# Patient Record
Sex: Female | Born: 1945 | Race: White | Hispanic: No | Marital: Single | Smoking: Former smoker
Health system: Southern US, Community
[De-identification: ages and names within clinical notes are randomized; demographics above are authoritative.]

## PROBLEM LIST (undated history)

## (undated) DIAGNOSIS — IMO0002 Reserved for concepts with insufficient information to code with codable children: Secondary | ICD-10-CM

## (undated) DIAGNOSIS — Z9981 Dependence on supplemental oxygen: Secondary | ICD-10-CM

## (undated) DIAGNOSIS — I739 Peripheral vascular disease, unspecified: Secondary | ICD-10-CM

## (undated) DIAGNOSIS — E785 Hyperlipidemia, unspecified: Secondary | ICD-10-CM

## (undated) DIAGNOSIS — I4891 Unspecified atrial fibrillation: Secondary | ICD-10-CM

## (undated) DIAGNOSIS — R011 Cardiac murmur, unspecified: Secondary | ICD-10-CM

## (undated) DIAGNOSIS — I10 Essential (primary) hypertension: Secondary | ICD-10-CM

## (undated) DIAGNOSIS — G4733 Obstructive sleep apnea (adult) (pediatric): Secondary | ICD-10-CM

## (undated) DIAGNOSIS — K449 Diaphragmatic hernia without obstruction or gangrene: Secondary | ICD-10-CM

## (undated) DIAGNOSIS — K2970 Gastritis, unspecified, without bleeding: Secondary | ICD-10-CM

## (undated) DIAGNOSIS — R739 Hyperglycemia, unspecified: Secondary | ICD-10-CM

## (undated) DIAGNOSIS — Z972 Presence of dental prosthetic device (complete) (partial): Secondary | ICD-10-CM

## (undated) DIAGNOSIS — R590 Localized enlarged lymph nodes: Secondary | ICD-10-CM

## (undated) DIAGNOSIS — I2510 Atherosclerotic heart disease of native coronary artery without angina pectoris: Secondary | ICD-10-CM

## (undated) DIAGNOSIS — M8580 Other specified disorders of bone density and structure, unspecified site: Secondary | ICD-10-CM

## (undated) DIAGNOSIS — C801 Malignant (primary) neoplasm, unspecified: Secondary | ICD-10-CM

## (undated) DIAGNOSIS — E669 Obesity, unspecified: Secondary | ICD-10-CM

## (undated) DIAGNOSIS — K922 Gastrointestinal hemorrhage, unspecified: Secondary | ICD-10-CM

## (undated) DIAGNOSIS — I639 Cerebral infarction, unspecified: Secondary | ICD-10-CM

## (undated) DIAGNOSIS — N189 Chronic kidney disease, unspecified: Secondary | ICD-10-CM

## (undated) DIAGNOSIS — J189 Pneumonia, unspecified organism: Secondary | ICD-10-CM

## (undated) HISTORY — PX: OTHER SURGICAL HISTORY: SHX169

## (undated) HISTORY — DX: Cardiac murmur, unspecified: R01.1

## (undated) HISTORY — DX: Gastritis, unspecified, without bleeding: K29.70

## (undated) HISTORY — DX: Peripheral vascular disease, unspecified: I73.9

## (undated) HISTORY — PX: BREAST SURGERY: SHX581

## (undated) HISTORY — DX: Chronic kidney disease, unspecified: N18.9

## (undated) HISTORY — DX: Hyperglycemia, unspecified: R73.9

## (undated) HISTORY — DX: Obstructive sleep apnea (adult) (pediatric): G47.33

## (undated) HISTORY — DX: Hyperlipidemia, unspecified: E78.5

## (undated) HISTORY — PX: CATARACT EXTRACTION W/ INTRAOCULAR LENS  IMPLANT, BILATERAL: SHX1307

## (undated) HISTORY — DX: Atherosclerotic heart disease of native coronary artery without angina pectoris: I25.10

## (undated) HISTORY — PX: CHOLECYSTECTOMY: SHX55

## (undated) HISTORY — DX: Obesity, unspecified: E66.9

## (undated) HISTORY — PX: COLONOSCOPY W/ BIOPSIES AND POLYPECTOMY: SHX1376

## (undated) HISTORY — DX: Gastrointestinal hemorrhage, unspecified: K92.2

## (undated) HISTORY — DX: Unspecified atrial fibrillation: I48.91

## (undated) HISTORY — DX: Cerebral infarction, unspecified: I63.9

## (undated) HISTORY — PX: ANGIOPLASTY / STENTING FEMORAL: SUR30

## (undated) HISTORY — DX: Essential (primary) hypertension: I10

## (undated) HISTORY — DX: Other specified disorders of bone density and structure, unspecified site: M85.80

## (undated) HISTORY — DX: Reserved for concepts with insufficient information to code with codable children: IMO0002

## (undated) HISTORY — DX: Diaphragmatic hernia without obstruction or gangrene: K44.9

---

## 2002-10-26 ENCOUNTER — Emergency Department (HOSPITAL_COMMUNITY): Admission: EM | Admit: 2002-10-26 | Discharge: 2002-10-26 | Payer: Self-pay | Admitting: Emergency Medicine

## 2002-12-31 ENCOUNTER — Emergency Department (HOSPITAL_COMMUNITY): Admission: EM | Admit: 2002-12-31 | Discharge: 2002-12-31 | Payer: Self-pay | Admitting: *Deleted

## 2004-05-01 ENCOUNTER — Encounter: Admission: RE | Admit: 2004-05-01 | Discharge: 2004-05-01 | Payer: Self-pay | Admitting: Nurse Practitioner

## 2005-01-28 ENCOUNTER — Encounter: Admission: RE | Admit: 2005-01-28 | Discharge: 2005-01-28 | Payer: Self-pay | Admitting: Family Medicine

## 2005-01-28 ENCOUNTER — Inpatient Hospital Stay (HOSPITAL_COMMUNITY): Admission: AD | Admit: 2005-01-28 | Discharge: 2005-01-30 | Payer: Self-pay | Admitting: General Surgery

## 2005-01-28 ENCOUNTER — Encounter (INDEPENDENT_AMBULATORY_CARE_PROVIDER_SITE_OTHER): Payer: Self-pay | Admitting: *Deleted

## 2005-06-18 ENCOUNTER — Encounter: Admission: RE | Admit: 2005-06-18 | Discharge: 2005-06-18 | Payer: Self-pay | Admitting: Family Medicine

## 2006-07-04 LAB — HM MAMMOGRAPHY

## 2006-07-14 ENCOUNTER — Encounter: Admission: RE | Admit: 2006-07-14 | Discharge: 2006-07-14 | Payer: Self-pay | Admitting: General Surgery

## 2007-01-20 ENCOUNTER — Encounter: Admission: RE | Admit: 2007-01-20 | Discharge: 2007-01-20 | Payer: Self-pay | Admitting: Cardiovascular Disease

## 2007-01-25 ENCOUNTER — Observation Stay (HOSPITAL_COMMUNITY): Admission: RE | Admit: 2007-01-25 | Discharge: 2007-01-26 | Payer: Self-pay | Admitting: Cardiovascular Disease

## 2007-03-05 LAB — HM COLONOSCOPY

## 2007-07-19 ENCOUNTER — Encounter: Admission: RE | Admit: 2007-07-19 | Discharge: 2007-07-19 | Payer: Self-pay | Admitting: Family Medicine

## 2007-09-09 ENCOUNTER — Encounter: Admission: RE | Admit: 2007-09-09 | Discharge: 2007-09-09 | Payer: Self-pay | Admitting: Cardiovascular Disease

## 2007-09-15 ENCOUNTER — Inpatient Hospital Stay (HOSPITAL_COMMUNITY): Admission: AD | Admit: 2007-09-15 | Discharge: 2007-09-16 | Payer: Self-pay | Admitting: Cardiovascular Disease

## 2007-10-12 ENCOUNTER — Inpatient Hospital Stay (HOSPITAL_COMMUNITY): Admission: AD | Admit: 2007-10-12 | Discharge: 2007-10-13 | Payer: Self-pay | Admitting: Cardiovascular Disease

## 2008-06-20 ENCOUNTER — Encounter: Admission: RE | Admit: 2008-06-20 | Discharge: 2008-06-20 | Payer: Self-pay | Admitting: Cardiovascular Disease

## 2008-06-26 ENCOUNTER — Inpatient Hospital Stay (HOSPITAL_COMMUNITY): Admission: RE | Admit: 2008-06-26 | Discharge: 2008-06-27 | Payer: Self-pay | Admitting: Cardiovascular Disease

## 2008-07-19 ENCOUNTER — Encounter: Admission: RE | Admit: 2008-07-19 | Discharge: 2008-07-19 | Payer: Self-pay | Admitting: Family Medicine

## 2009-05-15 ENCOUNTER — Encounter: Admission: RE | Admit: 2009-05-15 | Discharge: 2009-05-15 | Payer: Self-pay | Admitting: Cardiovascular Disease

## 2009-05-18 ENCOUNTER — Inpatient Hospital Stay (HOSPITAL_COMMUNITY): Admission: RE | Admit: 2009-05-18 | Discharge: 2009-05-19 | Payer: Self-pay | Admitting: Cardiovascular Disease

## 2009-06-18 ENCOUNTER — Ambulatory Visit: Payer: Self-pay | Admitting: Surgery

## 2009-07-03 ENCOUNTER — Encounter: Admission: RE | Admit: 2009-07-03 | Discharge: 2009-07-03 | Payer: Self-pay | Admitting: Surgery

## 2009-07-23 ENCOUNTER — Ambulatory Visit: Payer: Self-pay | Admitting: Surgery

## 2010-01-18 ENCOUNTER — Inpatient Hospital Stay (HOSPITAL_COMMUNITY): Admission: EM | Admit: 2010-01-18 | Discharge: 2010-01-24 | Payer: Self-pay | Admitting: Emergency Medicine

## 2010-01-22 ENCOUNTER — Encounter (INDEPENDENT_AMBULATORY_CARE_PROVIDER_SITE_OTHER): Payer: Self-pay | Admitting: Neurology

## 2010-01-23 ENCOUNTER — Ambulatory Visit: Payer: Self-pay | Admitting: Vascular Surgery

## 2010-02-14 ENCOUNTER — Encounter: Admission: RE | Admit: 2010-02-14 | Discharge: 2010-02-14 | Payer: Self-pay | Admitting: Family Medicine

## 2010-03-07 ENCOUNTER — Ambulatory Visit (HOSPITAL_COMMUNITY): Admission: RE | Admit: 2010-03-07 | Discharge: 2010-03-07 | Payer: Self-pay | Admitting: Family Medicine

## 2010-03-19 ENCOUNTER — Inpatient Hospital Stay (HOSPITAL_COMMUNITY): Admission: EM | Admit: 2010-03-19 | Discharge: 2010-03-24 | Payer: Self-pay | Admitting: Emergency Medicine

## 2010-05-02 ENCOUNTER — Ambulatory Visit (HOSPITAL_COMMUNITY): Admission: RE | Admit: 2010-05-02 | Discharge: 2010-05-02 | Payer: Self-pay | Admitting: Ophthalmology

## 2010-05-16 ENCOUNTER — Inpatient Hospital Stay (HOSPITAL_COMMUNITY): Admission: EM | Admit: 2010-05-16 | Discharge: 2010-05-30 | Payer: Self-pay | Admitting: Emergency Medicine

## 2010-05-17 ENCOUNTER — Ambulatory Visit: Payer: Self-pay | Admitting: Cardiothoracic Surgery

## 2010-05-17 ENCOUNTER — Encounter: Payer: Self-pay | Admitting: Cardiothoracic Surgery

## 2010-05-19 ENCOUNTER — Encounter: Payer: Self-pay | Admitting: Cardiothoracic Surgery

## 2010-05-22 ENCOUNTER — Encounter: Payer: Self-pay | Admitting: Cardiothoracic Surgery

## 2010-05-22 HISTORY — PX: CORONARY ARTERY BYPASS GRAFT: SHX141

## 2010-06-20 ENCOUNTER — Ambulatory Visit: Payer: Self-pay | Admitting: Cardiothoracic Surgery

## 2010-06-20 ENCOUNTER — Encounter: Admission: RE | Admit: 2010-06-20 | Discharge: 2010-06-20 | Payer: Self-pay | Admitting: Cardiothoracic Surgery

## 2010-08-13 HISTORY — PX: OTHER SURGICAL HISTORY: SHX169

## 2010-08-25 ENCOUNTER — Encounter: Payer: Self-pay | Admitting: Family Medicine

## 2010-09-11 ENCOUNTER — Encounter (HOSPITAL_COMMUNITY): Payer: Medicare Other

## 2010-09-11 ENCOUNTER — Other Ambulatory Visit: Payer: Self-pay | Admitting: Ophthalmology

## 2010-09-11 DIAGNOSIS — Z01812 Encounter for preprocedural laboratory examination: Secondary | ICD-10-CM | POA: Insufficient documentation

## 2010-09-11 LAB — BASIC METABOLIC PANEL
CO2: 31 mEq/L (ref 19–32)
Calcium: 9.2 mg/dL (ref 8.4–10.5)
Creatinine, Ser: 1.65 mg/dL — ABNORMAL HIGH (ref 0.4–1.2)
GFR calc Af Amer: 38 mL/min — ABNORMAL LOW (ref >60)
Glucose, Bld: 114 mg/dL — ABNORMAL HIGH (ref 70–99)
Potassium: 4.9 mEq/L (ref 3.5–5.1)

## 2010-09-16 ENCOUNTER — Ambulatory Visit (HOSPITAL_COMMUNITY)
Admission: RE | Admit: 2010-09-16 | Discharge: 2010-09-16 | Disposition: A | Discharge status: Home / Self Care | Payer: Medicare Other | Source: Ambulatory Visit | Attending: Ophthalmology | Admitting: Ophthalmology

## 2010-09-16 DIAGNOSIS — Z794 Long term (current) use of insulin: Secondary | ICD-10-CM | POA: Insufficient documentation

## 2010-09-16 DIAGNOSIS — Z01812 Encounter for preprocedural laboratory examination: Secondary | ICD-10-CM | POA: Insufficient documentation

## 2010-09-16 DIAGNOSIS — I10 Essential (primary) hypertension: Secondary | ICD-10-CM | POA: Insufficient documentation

## 2010-09-16 DIAGNOSIS — E119 Type 2 diabetes mellitus without complications: Secondary | ICD-10-CM | POA: Insufficient documentation

## 2010-09-16 DIAGNOSIS — H2510 Age-related nuclear cataract, unspecified eye: Secondary | ICD-10-CM | POA: Insufficient documentation

## 2010-09-16 DIAGNOSIS — Z79899 Other long term (current) drug therapy: Secondary | ICD-10-CM | POA: Insufficient documentation

## 2010-09-16 DIAGNOSIS — Z951 Presence of aortocoronary bypass graft: Secondary | ICD-10-CM | POA: Insufficient documentation

## 2010-09-16 DIAGNOSIS — Z01818 Encounter for other preprocedural examination: Secondary | ICD-10-CM | POA: Insufficient documentation

## 2010-09-16 LAB — GLUCOSE, CAPILLARY: Glucose-Capillary: 107 mg/dL — ABNORMAL HIGH (ref 70–99)

## 2010-10-16 LAB — CBC
HCT: 23.7 % — ABNORMAL LOW (ref 36.0–46.0)
HCT: 25.3 % — ABNORMAL LOW (ref 36.0–46.0)
HCT: 25.3 % — ABNORMAL LOW (ref 36.0–46.0)
HCT: 25.6 % — ABNORMAL LOW (ref 36.0–46.0)
HCT: 25.8 % — ABNORMAL LOW (ref 36.0–46.0)
HCT: 26.2 % — ABNORMAL LOW (ref 36.0–46.0)
HCT: 26.9 % — ABNORMAL LOW (ref 36.0–46.0)
HCT: 27.9 % — ABNORMAL LOW (ref 36.0–46.0)
HCT: 28.4 % — ABNORMAL LOW (ref 36.0–46.0)
HCT: 29.2 % — ABNORMAL LOW (ref 36.0–46.0)
HCT: 29.5 % — ABNORMAL LOW (ref 36.0–46.0)
HCT: 29.9 % — ABNORMAL LOW (ref 36.0–46.0)
HCT: 30.8 % — ABNORMAL LOW (ref 36.0–46.0)
HCT: 31.3 % — ABNORMAL LOW (ref 36.0–46.0)
Hemoglobin: 10 g/dL — ABNORMAL LOW (ref 12.0–15.0)
Hemoglobin: 10.1 g/dL — ABNORMAL LOW (ref 12.0–15.0)
Hemoglobin: 10.2 g/dL — ABNORMAL LOW (ref 12.0–15.0)
Hemoglobin: 10.5 g/dL — ABNORMAL LOW (ref 12.0–15.0)
Hemoglobin: 7.9 g/dL — ABNORMAL LOW (ref 12.0–15.0)
Hemoglobin: 8.3 g/dL — ABNORMAL LOW (ref 12.0–15.0)
Hemoglobin: 8.3 g/dL — ABNORMAL LOW (ref 12.0–15.0)
Hemoglobin: 8.4 g/dL — ABNORMAL LOW (ref 12.0–15.0)
Hemoglobin: 8.6 g/dL — ABNORMAL LOW (ref 12.0–15.0)
Hemoglobin: 8.8 g/dL — ABNORMAL LOW (ref 12.0–15.0)
Hemoglobin: 8.8 g/dL — ABNORMAL LOW (ref 12.0–15.0)
Hemoglobin: 9.3 g/dL — ABNORMAL LOW (ref 12.0–15.0)
Hemoglobin: 9.6 g/dL — ABNORMAL LOW (ref 12.0–15.0)
MCH: 31.5 pg (ref 26.0–34.0)
MCH: 31.5 pg (ref 26.0–34.0)
MCH: 31.7 pg (ref 26.0–34.0)
MCH: 31.8 pg (ref 26.0–34.0)
MCH: 31.8 pg (ref 26.0–34.0)
MCH: 31.9 pg (ref 26.0–34.0)
MCH: 32 pg (ref 26.0–34.0)
MCH: 32.1 pg (ref 26.0–34.0)
MCH: 32.2 pg (ref 26.0–34.0)
MCH: 32.2 pg (ref 26.0–34.0)
MCH: 32.3 pg (ref 26.0–34.0)
MCH: 33.2 pg (ref 26.0–34.0)
MCH: 33.2 pg (ref 26.0–34.0)
MCH: 33.2 pg (ref 26.0–34.0)
MCHC: 32.4 g/dL (ref 30.0–36.0)
MCHC: 32.7 g/dL (ref 30.0–36.0)
MCHC: 32.8 g/dL (ref 30.0–36.0)
MCHC: 32.8 g/dL (ref 30.0–36.0)
MCHC: 32.8 g/dL (ref 30.0–36.0)
MCHC: 33.1 g/dL (ref 30.0–36.0)
MCHC: 33.2 g/dL (ref 30.0–36.0)
MCHC: 33.3 g/dL (ref 30.0–36.0)
MCHC: 33.3 g/dL (ref 30.0–36.0)
MCHC: 33.3 g/dL (ref 30.0–36.0)
MCHC: 33.4 g/dL (ref 30.0–36.0)
MCHC: 33.5 g/dL (ref 30.0–36.0)
MCHC: 33.6 g/dL (ref 30.0–36.0)
MCHC: 33.6 g/dL (ref 30.0–36.0)
MCHC: 33.8 g/dL (ref 30.0–36.0)
MCHC: 34.1 g/dL (ref 30.0–36.0)
MCHC: 34.6 g/dL (ref 30.0–36.0)
MCV: 92.5 fL (ref 78.0–100.0)
MCV: 93.4 fL (ref 78.0–100.0)
MCV: 94.8 fL (ref 78.0–100.0)
MCV: 95 fL (ref 78.0–100.0)
MCV: 96.3 fL (ref 78.0–100.0)
MCV: 96.5 fL (ref 78.0–100.0)
MCV: 96.7 fL (ref 78.0–100.0)
MCV: 96.9 fL (ref 78.0–100.0)
MCV: 96.9 fL (ref 78.0–100.0)
MCV: 97.3 fL (ref 78.0–100.0)
MCV: 97.8 fL (ref 78.0–100.0)
MCV: 98.5 fL (ref 78.0–100.0)
MCV: 99.1 fL (ref 78.0–100.0)
Platelets: 102 10*3/uL — ABNORMAL LOW (ref 150–400)
Platelets: 106 10*3/uL — ABNORMAL LOW (ref 150–400)
Platelets: 112 10*3/uL — ABNORMAL LOW (ref 150–400)
Platelets: 117 10*3/uL — ABNORMAL LOW (ref 150–400)
Platelets: 133 10*3/uL — ABNORMAL LOW (ref 150–400)
Platelets: 136 10*3/uL — ABNORMAL LOW (ref 150–400)
Platelets: 140 10*3/uL — ABNORMAL LOW (ref 150–400)
Platelets: 153 10*3/uL (ref 150–400)
Platelets: 168 10*3/uL (ref 150–400)
Platelets: 168 10*3/uL (ref 150–400)
Platelets: 86 10*3/uL — ABNORMAL LOW (ref 150–400)
Platelets: 93 10*3/uL — ABNORMAL LOW (ref 150–400)
Platelets: 95 10*3/uL — ABNORMAL LOW (ref 150–400)
RBC: 2.46 MIL/uL — ABNORMAL LOW (ref 3.87–5.11)
RBC: 2.6 MIL/uL — ABNORMAL LOW (ref 3.87–5.11)
RBC: 2.61 MIL/uL — ABNORMAL LOW (ref 3.87–5.11)
RBC: 2.67 MIL/uL — ABNORMAL LOW (ref 3.87–5.11)
RBC: 2.71 MIL/uL — ABNORMAL LOW (ref 3.87–5.11)
RBC: 2.75 MIL/uL — ABNORMAL LOW (ref 3.87–5.11)
RBC: 2.79 MIL/uL — ABNORMAL LOW (ref 3.87–5.11)
RBC: 2.89 MIL/uL — ABNORMAL LOW (ref 3.87–5.11)
RBC: 3.16 MIL/uL — ABNORMAL LOW (ref 3.87–5.11)
RBC: 3.16 MIL/uL — ABNORMAL LOW (ref 3.87–5.11)
RBC: 3.18 MIL/uL — ABNORMAL LOW (ref 3.87–5.11)
RDW: 12.9 % (ref 11.5–15.5)
RDW: 13 % (ref 11.5–15.5)
RDW: 13 % (ref 11.5–15.5)
RDW: 13.1 % (ref 11.5–15.5)
RDW: 13.2 % (ref 11.5–15.5)
RDW: 15.7 % — ABNORMAL HIGH (ref 11.5–15.5)
RDW: 15.8 % — ABNORMAL HIGH (ref 11.5–15.5)
RDW: 15.9 % — ABNORMAL HIGH (ref 11.5–15.5)
RDW: 16 % — ABNORMAL HIGH (ref 11.5–15.5)
RDW: 16 % — ABNORMAL HIGH (ref 11.5–15.5)
RDW: 16 % — ABNORMAL HIGH (ref 11.5–15.5)
RDW: 16.3 % — ABNORMAL HIGH (ref 11.5–15.5)
RDW: 16.5 % — ABNORMAL HIGH (ref 11.5–15.5)
RDW: 16.9 % — ABNORMAL HIGH (ref 11.5–15.5)
RDW: 16.9 % — ABNORMAL HIGH (ref 11.5–15.5)
WBC: 10 10*3/uL (ref 4.0–10.5)
WBC: 11.2 10*3/uL — ABNORMAL HIGH (ref 4.0–10.5)
WBC: 12.6 10*3/uL — ABNORMAL HIGH (ref 4.0–10.5)
WBC: 4.9 10*3/uL (ref 4.0–10.5)
WBC: 4.9 10*3/uL (ref 4.0–10.5)
WBC: 5.3 10*3/uL (ref 4.0–10.5)
WBC: 5.8 10*3/uL (ref 4.0–10.5)
WBC: 6.5 10*3/uL (ref 4.0–10.5)
WBC: 6.6 10*3/uL (ref 4.0–10.5)
WBC: 7 10*3/uL (ref 4.0–10.5)
WBC: 8.8 10*3/uL (ref 4.0–10.5)
WBC: 9.4 10*3/uL (ref 4.0–10.5)

## 2010-10-16 LAB — BLOOD GAS, ARTERIAL
Acid-Base Excess: 4.9 mmol/L — ABNORMAL HIGH (ref 0.0–2.0)
Bicarbonate: 28.2 mEq/L — ABNORMAL HIGH (ref 20.0–24.0)
Drawn by: 13842
FIO2: 0.21 %
O2 Saturation: 98.6 %
Patient temperature: 98.6
TCO2: 29.4 mmol/L (ref 0–100)
pCO2 arterial: 36.8 mmHg (ref 35.0–45.0)
pH, Arterial: 7.497 — ABNORMAL HIGH (ref 7.350–7.400)
pO2, Arterial: 103 mmHg — ABNORMAL HIGH (ref 80.0–100.0)

## 2010-10-16 LAB — BASIC METABOLIC PANEL
BUN: 11 mg/dL (ref 6–23)
BUN: 11 mg/dL (ref 6–23)
BUN: 11 mg/dL (ref 6–23)
BUN: 12 mg/dL (ref 6–23)
BUN: 12 mg/dL (ref 6–23)
BUN: 15 mg/dL (ref 6–23)
BUN: 15 mg/dL (ref 6–23)
BUN: 16 mg/dL (ref 6–23)
BUN: 18 mg/dL (ref 6–23)
BUN: 19 mg/dL (ref 6–23)
BUN: 20 mg/dL (ref 6–23)
BUN: 22 mg/dL (ref 6–23)
CO2: 25 mEq/L (ref 19–32)
CO2: 28 mEq/L (ref 19–32)
CO2: 29 mEq/L (ref 19–32)
CO2: 30 mEq/L (ref 19–32)
CO2: 30 mEq/L (ref 19–32)
CO2: 31 mEq/L (ref 19–32)
CO2: 31 mEq/L (ref 19–32)
CO2: 32 mEq/L (ref 19–32)
CO2: 32 mEq/L (ref 19–32)
CO2: 33 mEq/L — ABNORMAL HIGH (ref 19–32)
CO2: 35 mEq/L — ABNORMAL HIGH (ref 19–32)
CO2: 35 mEq/L — ABNORMAL HIGH (ref 19–32)
Calcium: 8 mg/dL — ABNORMAL LOW (ref 8.4–10.5)
Calcium: 8 mg/dL — ABNORMAL LOW (ref 8.4–10.5)
Calcium: 8.4 mg/dL (ref 8.4–10.5)
Calcium: 8.4 mg/dL (ref 8.4–10.5)
Calcium: 8.4 mg/dL (ref 8.4–10.5)
Calcium: 8.6 mg/dL (ref 8.4–10.5)
Calcium: 8.8 mg/dL (ref 8.4–10.5)
Calcium: 8.8 mg/dL (ref 8.4–10.5)
Calcium: 8.8 mg/dL (ref 8.4–10.5)
Calcium: 9.3 mg/dL (ref 8.4–10.5)
Calcium: 9.3 mg/dL (ref 8.4–10.5)
Chloride: 101 mEq/L (ref 96–112)
Chloride: 103 mEq/L (ref 96–112)
Chloride: 104 mEq/L (ref 96–112)
Chloride: 104 mEq/L (ref 96–112)
Chloride: 104 mEq/L (ref 96–112)
Chloride: 105 mEq/L (ref 96–112)
Chloride: 106 mEq/L (ref 96–112)
Chloride: 106 mEq/L (ref 96–112)
Chloride: 107 mEq/L (ref 96–112)
Chloride: 107 mEq/L (ref 96–112)
Chloride: 108 mEq/L (ref 96–112)
Chloride: 112 mEq/L (ref 96–112)
Creatinine, Ser: 1.06 mg/dL (ref 0.4–1.2)
Creatinine, Ser: 1.08 mg/dL (ref 0.4–1.2)
Creatinine, Ser: 1.09 mg/dL (ref 0.4–1.2)
Creatinine, Ser: 1.11 mg/dL (ref 0.4–1.2)
Creatinine, Ser: 1.17 mg/dL (ref 0.4–1.2)
Creatinine, Ser: 1.2 mg/dL (ref 0.4–1.2)
Creatinine, Ser: 1.27 mg/dL — ABNORMAL HIGH (ref 0.4–1.2)
Creatinine, Ser: 1.28 mg/dL — ABNORMAL HIGH (ref 0.4–1.2)
Creatinine, Ser: 1.3 mg/dL — ABNORMAL HIGH (ref 0.4–1.2)
Creatinine, Ser: 1.34 mg/dL — ABNORMAL HIGH (ref 0.4–1.2)
Creatinine, Ser: 1.4 mg/dL — ABNORMAL HIGH (ref 0.4–1.2)
GFR calc Af Amer: 46 mL/min — ABNORMAL LOW (ref >60)
GFR calc Af Amer: 48 mL/min — ABNORMAL LOW (ref >60)
GFR calc Af Amer: 48 mL/min — ABNORMAL LOW (ref >60)
GFR calc Af Amer: 51 mL/min — ABNORMAL LOW (ref >60)
GFR calc Af Amer: 52 mL/min — ABNORMAL LOW (ref >60)
GFR calc Af Amer: 55 mL/min — ABNORMAL LOW (ref >60)
GFR calc Af Amer: 56 mL/min — ABNORMAL LOW (ref >60)
GFR calc Af Amer: 60 mL/min — ABNORMAL LOW (ref >60)
GFR calc Af Amer: >60 mL/min (ref >60)
GFR calc Af Amer: >60 mL/min (ref >60)
GFR calc Af Amer: >60 mL/min (ref >60)
GFR calc non Af Amer: 38 mL/min — ABNORMAL LOW (ref >60)
GFR calc non Af Amer: 40 mL/min — ABNORMAL LOW (ref >60)
GFR calc non Af Amer: 42 mL/min — ABNORMAL LOW (ref >60)
GFR calc non Af Amer: 43 mL/min — ABNORMAL LOW (ref >60)
GFR calc non Af Amer: 45 mL/min — ABNORMAL LOW (ref >60)
GFR calc non Af Amer: 46 mL/min — ABNORMAL LOW (ref >60)
GFR calc non Af Amer: 49 mL/min — ABNORMAL LOW (ref >60)
GFR calc non Af Amer: 50 mL/min — ABNORMAL LOW (ref >60)
GFR calc non Af Amer: 50 mL/min — ABNORMAL LOW (ref >60)
GFR calc non Af Amer: 51 mL/min — ABNORMAL LOW (ref >60)
GFR calc non Af Amer: 52 mL/min — ABNORMAL LOW (ref >60)
Glucose, Bld: 100 mg/dL — ABNORMAL HIGH (ref 70–99)
Glucose, Bld: 101 mg/dL — ABNORMAL HIGH (ref 70–99)
Glucose, Bld: 101 mg/dL — ABNORMAL HIGH (ref 70–99)
Glucose, Bld: 105 mg/dL — ABNORMAL HIGH (ref 70–99)
Glucose, Bld: 106 mg/dL — ABNORMAL HIGH (ref 70–99)
Glucose, Bld: 107 mg/dL — ABNORMAL HIGH (ref 70–99)
Glucose, Bld: 122 mg/dL — ABNORMAL HIGH (ref 70–99)
Glucose, Bld: 127 mg/dL — ABNORMAL HIGH (ref 70–99)
Glucose, Bld: 138 mg/dL — ABNORMAL HIGH (ref 70–99)
Glucose, Bld: 143 mg/dL — ABNORMAL HIGH (ref 70–99)
Glucose, Bld: 78 mg/dL (ref 70–99)
Glucose, Bld: 87 mg/dL (ref 70–99)
Glucose, Bld: 94 mg/dL (ref 70–99)
Potassium: 3.1 mEq/L — ABNORMAL LOW (ref 3.5–5.1)
Potassium: 3.3 mEq/L — ABNORMAL LOW (ref 3.5–5.1)
Potassium: 3.5 mEq/L (ref 3.5–5.1)
Potassium: 3.5 mEq/L (ref 3.5–5.1)
Potassium: 3.7 mEq/L (ref 3.5–5.1)
Potassium: 3.8 mEq/L (ref 3.5–5.1)
Potassium: 3.9 mEq/L (ref 3.5–5.1)
Potassium: 3.9 mEq/L (ref 3.5–5.1)
Potassium: 3.9 mEq/L (ref 3.5–5.1)
Potassium: 4 mEq/L (ref 3.5–5.1)
Potassium: 4.1 mEq/L (ref 3.5–5.1)
Potassium: 4.3 mEq/L (ref 3.5–5.1)
Potassium: 4.3 mEq/L (ref 3.5–5.1)
Sodium: 139 mEq/L (ref 135–145)
Sodium: 141 mEq/L (ref 135–145)
Sodium: 141 mEq/L (ref 135–145)
Sodium: 141 mEq/L (ref 135–145)
Sodium: 142 mEq/L (ref 135–145)
Sodium: 142 mEq/L (ref 135–145)
Sodium: 142 mEq/L (ref 135–145)
Sodium: 143 mEq/L (ref 135–145)
Sodium: 144 mEq/L (ref 135–145)
Sodium: 144 mEq/L (ref 135–145)
Sodium: 144 mEq/L (ref 135–145)

## 2010-10-16 LAB — GLUCOSE, CAPILLARY
Glucose-Capillary: 102 mg/dL — ABNORMAL HIGH (ref 70–99)
Glucose-Capillary: 102 mg/dL — ABNORMAL HIGH (ref 70–99)
Glucose-Capillary: 102 mg/dL — ABNORMAL HIGH (ref 70–99)
Glucose-Capillary: 104 mg/dL — ABNORMAL HIGH (ref 70–99)
Glucose-Capillary: 104 mg/dL — ABNORMAL HIGH (ref 70–99)
Glucose-Capillary: 105 mg/dL — ABNORMAL HIGH (ref 70–99)
Glucose-Capillary: 106 mg/dL — ABNORMAL HIGH (ref 70–99)
Glucose-Capillary: 110 mg/dL — ABNORMAL HIGH (ref 70–99)
Glucose-Capillary: 111 mg/dL — ABNORMAL HIGH (ref 70–99)
Glucose-Capillary: 112 mg/dL — ABNORMAL HIGH (ref 70–99)
Glucose-Capillary: 119 mg/dL — ABNORMAL HIGH (ref 70–99)
Glucose-Capillary: 123 mg/dL — ABNORMAL HIGH (ref 70–99)
Glucose-Capillary: 123 mg/dL — ABNORMAL HIGH (ref 70–99)
Glucose-Capillary: 124 mg/dL — ABNORMAL HIGH (ref 70–99)
Glucose-Capillary: 127 mg/dL — ABNORMAL HIGH (ref 70–99)
Glucose-Capillary: 130 mg/dL — ABNORMAL HIGH (ref 70–99)
Glucose-Capillary: 131 mg/dL — ABNORMAL HIGH (ref 70–99)
Glucose-Capillary: 134 mg/dL — ABNORMAL HIGH (ref 70–99)
Glucose-Capillary: 140 mg/dL — ABNORMAL HIGH (ref 70–99)
Glucose-Capillary: 145 mg/dL — ABNORMAL HIGH (ref 70–99)
Glucose-Capillary: 146 mg/dL — ABNORMAL HIGH (ref 70–99)
Glucose-Capillary: 157 mg/dL — ABNORMAL HIGH (ref 70–99)
Glucose-Capillary: 75 mg/dL (ref 70–99)
Glucose-Capillary: 78 mg/dL (ref 70–99)
Glucose-Capillary: 87 mg/dL (ref 70–99)
Glucose-Capillary: 88 mg/dL (ref 70–99)
Glucose-Capillary: 96 mg/dL (ref 70–99)
Glucose-Capillary: 97 mg/dL (ref 70–99)

## 2010-10-16 LAB — PROTIME-INR
INR: 1.04 (ref 0.00–1.49)
INR: 1.1 (ref 0.00–1.49)
INR: 1.13 (ref 0.00–1.49)
INR: 1.48 (ref 0.00–1.49)
INR: 1.64 — ABNORMAL HIGH (ref 0.00–1.49)
Prothrombin Time: 14.4 seconds (ref 11.6–15.2)
Prothrombin Time: 14.7 seconds (ref 11.6–15.2)
Prothrombin Time: 18.1 seconds — ABNORMAL HIGH (ref 11.6–15.2)
Prothrombin Time: 19.6 seconds — ABNORMAL HIGH (ref 11.6–15.2)

## 2010-10-16 LAB — POCT I-STAT 3, ART BLOOD GAS (G3+)
Acid-Base Excess: 1 mmol/L (ref 0.0–2.0)
Acid-Base Excess: 1 mmol/L (ref 0.0–2.0)
Acid-base deficit: 1 mmol/L (ref 0.0–2.0)
Bicarbonate: 23 mEq/L (ref 20.0–24.0)
Bicarbonate: 25.4 mEq/L — ABNORMAL HIGH (ref 20.0–24.0)
O2 Saturation: 100 %
O2 Saturation: 100 %
O2 Saturation: 93 %
Patient temperature: 32
Patient temperature: 36.5
TCO2: 24 mmol/L (ref 0–100)
TCO2: 26 mmol/L (ref 0–100)
TCO2: 27 mmol/L (ref 0–100)
pCO2 arterial: 30.3 mmHg — ABNORMAL LOW (ref 35.0–45.0)
pCO2 arterial: 37 mmHg (ref 35.0–45.0)
pH, Arterial: 7.341 — ABNORMAL LOW (ref 7.350–7.400)
pH, Arterial: 7.477 — ABNORMAL HIGH (ref 7.350–7.400)
pH, Arterial: 7.522 — ABNORMAL HIGH (ref 7.350–7.400)
pO2, Arterial: 371 mmHg — ABNORMAL HIGH (ref 80.0–100.0)

## 2010-10-16 LAB — PREPARE RBC (CROSSMATCH)

## 2010-10-16 LAB — PREPARE PLATELETS: Unit division: 0

## 2010-10-16 LAB — HEMOGLOBIN AND HEMATOCRIT, BLOOD
HCT: 23.7 % — ABNORMAL LOW (ref 36.0–46.0)
Hemoglobin: 8.1 g/dL — ABNORMAL LOW (ref 12.0–15.0)

## 2010-10-16 LAB — MAGNESIUM: Magnesium: 2.5 mg/dL (ref 1.5–2.5)

## 2010-10-16 LAB — POCT I-STAT 3, VENOUS BLOOD GAS (G3P V)
Bicarbonate: 24.3 mEq/L — ABNORMAL HIGH (ref 20.0–24.0)
O2 Saturation: 79 %
TCO2: 25 mmol/L (ref 0–100)
pCO2, Ven: 31.4 mmHg — ABNORMAL LOW (ref 45.0–50.0)

## 2010-10-16 LAB — POCT I-STAT 4, (NA,K, GLUC, HGB,HCT)
Glucose, Bld: 103 mg/dL — ABNORMAL HIGH (ref 70–99)
Glucose, Bld: 172 mg/dL — ABNORMAL HIGH (ref 70–99)
Glucose, Bld: 184 mg/dL — ABNORMAL HIGH (ref 70–99)
HCT: 23 % — ABNORMAL LOW (ref 36.0–46.0)
HCT: 28 % — ABNORMAL LOW (ref 36.0–46.0)
Hemoglobin: 7.8 g/dL — ABNORMAL LOW (ref 12.0–15.0)
Hemoglobin: 9.5 g/dL — ABNORMAL LOW (ref 12.0–15.0)
Potassium: 3 mEq/L — ABNORMAL LOW (ref 3.5–5.1)
Sodium: 139 mEq/L (ref 135–145)
Sodium: 142 mEq/L (ref 135–145)

## 2010-10-16 LAB — CK TOTAL AND CKMB (NOT AT ARMC)
CK, MB: 27.6 ng/mL (ref 0.3–4.0)
Relative Index: 14.6 — ABNORMAL HIGH (ref 0.0–2.5)

## 2010-10-16 LAB — TYPE AND SCREEN
Antibody Screen: NEGATIVE
Unit division: 0
Unit division: 0

## 2010-10-16 LAB — PLATELET COUNT: Platelets: 69 10*3/uL — ABNORMAL LOW (ref 150–400)

## 2010-10-16 LAB — ABO/RH: ABO/RH(D): AB POS

## 2010-10-16 LAB — CREATININE, SERUM
Creatinine, Ser: 0.98 mg/dL (ref 0.4–1.2)
GFR calc Af Amer: >60 mL/min (ref >60)
GFR calc non Af Amer: 57 mL/min — ABNORMAL LOW (ref >60)

## 2010-10-16 LAB — POCT I-STAT, CHEM 8
BUN: 12 mg/dL (ref 6–23)
Calcium, Ion: 1.11 mmol/L — ABNORMAL LOW (ref 1.12–1.32)
HCT: 29 % — ABNORMAL LOW (ref 36.0–46.0)
Hemoglobin: 9.9 g/dL — ABNORMAL LOW (ref 12.0–15.0)
Sodium: 142 mEq/L (ref 135–145)
TCO2: 23 mmol/L (ref 0–100)

## 2010-10-16 LAB — POCT I-STAT GLUCOSE: Glucose, Bld: 165 mg/dL — ABNORMAL HIGH (ref 70–99)

## 2010-10-16 LAB — APTT
aPTT: 122 seconds — ABNORMAL HIGH (ref 24–37)
aPTT: 30 seconds (ref 24–37)
aPTT: 34 seconds (ref 24–37)

## 2010-10-16 LAB — HEPARIN LEVEL (UNFRACTIONATED)
Heparin Unfractionated: 0.59 IU/mL (ref 0.30–0.70)
Heparin Unfractionated: 0.76 IU/mL — ABNORMAL HIGH (ref 0.30–0.70)

## 2010-10-16 LAB — PLATELET INHIBITION P2Y12: P2Y12 % Inhibition: 0 %

## 2010-10-17 LAB — DIFFERENTIAL
Basophils Absolute: 0 10*3/uL (ref 0.0–0.1)
Basophils Absolute: 0 10*3/uL (ref 0.0–0.1)
Basophils Relative: 1 % (ref 0–1)
Basophils Relative: 1 % (ref 0–1)
Basophils Relative: 1 % (ref 0–1)
Eosinophils Absolute: 0.2 10*3/uL (ref 0.0–0.7)
Eosinophils Absolute: 0.2 10*3/uL (ref 0.0–0.7)
Eosinophils Absolute: 0.3 10*3/uL (ref 0.0–0.7)
Eosinophils Absolute: 0.3 10*3/uL (ref 0.0–0.7)
Eosinophils Relative: 4 % (ref 0–5)
Eosinophils Relative: 4 % (ref 0–5)
Lymphs Abs: 1.5 10*3/uL (ref 0.7–4.0)
Monocytes Absolute: 0.5 10*3/uL (ref 0.1–1.0)
Monocytes Relative: 10 % (ref 3–12)
Neutro Abs: 3.6 10*3/uL (ref 1.7–7.7)
Neutro Abs: 4.1 10*3/uL (ref 1.7–7.7)
Neutrophils Relative %: 59 % (ref 43–77)
Neutrophils Relative %: 64 % (ref 43–77)
Neutrophils Relative %: 67 % (ref 43–77)

## 2010-10-17 LAB — CBC
HCT: 30.3 % — ABNORMAL LOW (ref 36.0–46.0)
HCT: 30.9 % — ABNORMAL LOW (ref 36.0–46.0)
HCT: 32.8 % — ABNORMAL LOW (ref 36.0–46.0)
Hemoglobin: 10.1 g/dL — ABNORMAL LOW (ref 12.0–15.0)
Hemoglobin: 10.3 g/dL — ABNORMAL LOW (ref 12.0–15.0)
Hemoglobin: 11.1 g/dL — ABNORMAL LOW (ref 12.0–15.0)
MCH: 32.9 pg (ref 26.0–34.0)
MCH: 33.2 pg (ref 26.0–34.0)
MCH: 33.7 pg (ref 26.0–34.0)
MCHC: 33.1 g/dL (ref 30.0–36.0)
MCHC: 33.2 g/dL (ref 30.0–36.0)
MCHC: 34 g/dL (ref 30.0–36.0)
MCV: 98.2 fL (ref 78.0–100.0)
MCV: 99 fL (ref 78.0–100.0)
Platelets: 102 10*3/uL — ABNORMAL LOW (ref 150–400)
Platelets: 92 10*3/uL — ABNORMAL LOW (ref 150–400)
RBC: 3.34 MIL/uL — ABNORMAL LOW (ref 3.87–5.11)
RDW: 12.9 % (ref 11.5–15.5)
RDW: 13 % (ref 11.5–15.5)
RDW: 13 % (ref 11.5–15.5)
WBC: 6 10*3/uL (ref 4.0–10.5)

## 2010-10-17 LAB — PROTIME-INR
INR: 1.19 (ref 0.00–1.49)
Prothrombin Time: 15.3 seconds — ABNORMAL HIGH (ref 11.6–15.2)

## 2010-10-17 LAB — COMPREHENSIVE METABOLIC PANEL
ALT: 13 U/L (ref 0–35)
AST: 17 U/L (ref 0–37)
CO2: 31 mEq/L (ref 19–32)
Calcium: 9.2 mg/dL (ref 8.4–10.5)
Chloride: 104 mEq/L (ref 96–112)
GFR calc Af Amer: 48 mL/min — ABNORMAL LOW (ref >60)
GFR calc non Af Amer: 39 mL/min — ABNORMAL LOW (ref >60)
Potassium: 3.7 mEq/L (ref 3.5–5.1)
Sodium: 143 mEq/L (ref 135–145)

## 2010-10-17 LAB — LIPID PANEL
Cholesterol: 88 mg/dL (ref 0–200)
HDL: 36 mg/dL — ABNORMAL LOW (ref >39)
Triglycerides: 107 mg/dL (ref <150)

## 2010-10-17 LAB — BASIC METABOLIC PANEL
BUN: 13 mg/dL (ref 6–23)
BUN: 17 mg/dL (ref 6–23)
CO2: 30 mEq/L (ref 19–32)
CO2: 30 mEq/L (ref 19–32)
Calcium: 8.7 mg/dL (ref 8.4–10.5)
Chloride: 106 mEq/L (ref 96–112)
GFR calc non Af Amer: 39 mL/min — ABNORMAL LOW (ref >60)
Glucose, Bld: 108 mg/dL — ABNORMAL HIGH (ref 70–99)
Glucose, Bld: 118 mg/dL — ABNORMAL HIGH (ref 70–99)
Potassium: 3.4 mEq/L — ABNORMAL LOW (ref 3.5–5.1)
Sodium: 144 mEq/L (ref 135–145)

## 2010-10-17 LAB — MAGNESIUM: Magnesium: 2.1 mg/dL (ref 1.5–2.5)

## 2010-10-17 LAB — URINALYSIS, ROUTINE W REFLEX MICROSCOPIC
Bilirubin Urine: NEGATIVE
Glucose, UA: NEGATIVE mg/dL
Hgb urine dipstick: NEGATIVE
Ketones, ur: NEGATIVE mg/dL
Nitrite: NEGATIVE
Protein, ur: NEGATIVE mg/dL
Specific Gravity, Urine: 1.009 (ref 1.005–1.030)
Urobilinogen, UA: 0.2 mg/dL (ref 0.0–1.0)
pH: 5 (ref 5.0–8.0)

## 2010-10-17 LAB — GLUCOSE, CAPILLARY
Glucose-Capillary: 110 mg/dL — ABNORMAL HIGH (ref 70–99)
Glucose-Capillary: 114 mg/dL — ABNORMAL HIGH (ref 70–99)
Glucose-Capillary: 128 mg/dL — ABNORMAL HIGH (ref 70–99)
Glucose-Capillary: 132 mg/dL — ABNORMAL HIGH (ref 70–99)

## 2010-10-17 LAB — POCT CARDIAC MARKERS

## 2010-10-17 LAB — PLATELET INHIBITION P2Y12
P2Y12 % Inhibition: 0 %
Platelet Function  P2Y12: 370 [PRU] (ref 194–418)
Platelet Function Baseline: 348 [PRU] (ref 194–418)

## 2010-10-17 LAB — HEPARIN LEVEL (UNFRACTIONATED): Heparin Unfractionated: 0.43 IU/mL (ref 0.30–0.70)

## 2010-10-17 LAB — CARDIAC PANEL(CRET KIN+CKTOT+MB+TROPI)
CK, MB: 0.4 ng/mL (ref 0.3–4.0)
Relative Index: INVALID (ref 0.0–2.5)
Total CK: 20 U/L (ref 7–177)

## 2010-10-17 LAB — TROPONIN I: Troponin I: 0.01 ng/mL (ref 0.00–0.06)

## 2010-10-17 LAB — MRSA PCR SCREENING: MRSA by PCR: NEGATIVE

## 2010-10-18 LAB — CBC
HCT: 32.1 % — ABNORMAL LOW (ref 36.0–46.0)
HCT: 35.3 % — ABNORMAL LOW (ref 36.0–46.0)
HCT: 37.8 % (ref 36.0–46.0)
HCT: 41.4 % (ref 36.0–46.0)
Hemoglobin: 11.9 g/dL — ABNORMAL LOW (ref 12.0–15.0)
Hemoglobin: 12.4 g/dL (ref 12.0–15.0)
MCH: 31.8 pg (ref 26.0–34.0)
MCH: 31.8 pg (ref 26.0–34.0)
MCH: 31.9 pg (ref 26.0–34.0)
MCH: 32 pg (ref 26.0–34.0)
MCH: 32.3 pg (ref 26.0–34.0)
MCH: 33.3 pg (ref 26.0–34.0)
MCHC: 32.7 g/dL (ref 30.0–36.0)
MCHC: 32.8 g/dL (ref 30.0–36.0)
MCHC: 33.7 g/dL (ref 30.0–36.0)
MCV: 95.5 fL (ref 78.0–100.0)
MCV: 95.6 fL (ref 78.0–100.0)
MCV: 95.8 fL (ref 78.0–100.0)
MCV: 95.9 fL (ref 78.0–100.0)
MCV: 97.3 fL (ref 78.0–100.0)
MCV: 97.4 fL (ref 78.0–100.0)
Platelets: 113 10*3/uL — ABNORMAL LOW (ref 150–400)
Platelets: 114 10*3/uL — ABNORMAL LOW (ref 150–400)
RBC: 3.36 MIL/uL — ABNORMAL LOW (ref 3.87–5.11)
RBC: 3.88 MIL/uL (ref 3.87–5.11)
RBC: 4.33 MIL/uL (ref 3.87–5.11)
RDW: 13.8 % (ref 11.5–15.5)
RDW: 14.2 % (ref 11.5–15.5)
WBC: 6.1 10*3/uL (ref 4.0–10.5)
WBC: 6.2 10*3/uL (ref 4.0–10.5)
WBC: 6.2 10*3/uL (ref 4.0–10.5)

## 2010-10-18 LAB — URINALYSIS, ROUTINE W REFLEX MICROSCOPIC
Ketones, ur: NEGATIVE mg/dL
Nitrite: NEGATIVE
Protein, ur: NEGATIVE mg/dL
Urobilinogen, UA: 0.2 mg/dL (ref 0.0–1.0)
pH: 5 (ref 5.0–8.0)

## 2010-10-18 LAB — GLUCOSE, CAPILLARY
Glucose-Capillary: 106 mg/dL — ABNORMAL HIGH (ref 70–99)
Glucose-Capillary: 113 mg/dL — ABNORMAL HIGH (ref 70–99)
Glucose-Capillary: 113 mg/dL — ABNORMAL HIGH (ref 70–99)
Glucose-Capillary: 116 mg/dL — ABNORMAL HIGH (ref 70–99)
Glucose-Capillary: 124 mg/dL — ABNORMAL HIGH (ref 70–99)
Glucose-Capillary: 125 mg/dL — ABNORMAL HIGH (ref 70–99)
Glucose-Capillary: 130 mg/dL — ABNORMAL HIGH (ref 70–99)
Glucose-Capillary: 132 mg/dL — ABNORMAL HIGH (ref 70–99)
Glucose-Capillary: 138 mg/dL — ABNORMAL HIGH (ref 70–99)
Glucose-Capillary: 152 mg/dL — ABNORMAL HIGH (ref 70–99)
Glucose-Capillary: 153 mg/dL — ABNORMAL HIGH (ref 70–99)
Glucose-Capillary: 176 mg/dL — ABNORMAL HIGH (ref 70–99)
Glucose-Capillary: 92 mg/dL (ref 70–99)

## 2010-10-18 LAB — RENAL FUNCTION PANEL
BUN: 26 mg/dL — ABNORMAL HIGH (ref 6–23)
CO2: 24 mEq/L (ref 19–32)
CO2: 25 mEq/L (ref 19–32)
CO2: 25 mEq/L (ref 19–32)
Calcium: 8.1 mg/dL — ABNORMAL LOW (ref 8.4–10.5)
Calcium: 8.5 mg/dL (ref 8.4–10.5)
Chloride: 102 mEq/L (ref 96–112)
Creatinine, Ser: 4.59 mg/dL — ABNORMAL HIGH (ref 0.4–1.2)
Creatinine, Ser: 4.7 mg/dL — ABNORMAL HIGH (ref 0.4–1.2)
Creatinine, Ser: 5.05 mg/dL — ABNORMAL HIGH (ref 0.4–1.2)
GFR calc Af Amer: 11 mL/min — ABNORMAL LOW (ref >60)
GFR calc Af Amer: 12 mL/min — ABNORMAL LOW (ref >60)
GFR calc non Af Amer: 10 mL/min — ABNORMAL LOW (ref >60)
GFR calc non Af Amer: 9 mL/min — ABNORMAL LOW (ref >60)
Glucose, Bld: 118 mg/dL — ABNORMAL HIGH (ref 70–99)
Glucose, Bld: 130 mg/dL — ABNORMAL HIGH (ref 70–99)
Phosphorus: 4.3 mg/dL (ref 2.3–4.6)
Potassium: 4.2 mEq/L (ref 3.5–5.1)

## 2010-10-18 LAB — PROTEIN ELECTROPH W RFLX QUANT IMMUNOGLOBULINS
Albumin ELP: 54.6 % — ABNORMAL LOW (ref 55.8–66.1)
Alpha-1-Globulin: 8.6 % — ABNORMAL HIGH (ref 2.9–4.9)
Beta 2: 4.1 % (ref 3.2–6.5)
Beta Globulin: 6 % (ref 4.7–7.2)
Total Protein ELP: 5.9 g/dL — ABNORMAL LOW (ref 6.0–8.3)

## 2010-10-18 LAB — UIFE/LIGHT CHAINS/TP QN, 24-HR UR
Albumin, U: DETECTED
Alpha 1, Urine: DETECTED — AB
Free Lambda Excretion/Day: 8.97 mg/d
Free Lambda Lt Chains,Ur: 0.97 mg/dL (ref 0.08–1.01)
Gamma Globulin, Urine: DETECTED — AB
Time: 24 hours
Total Protein, Urine-Ur/day: 178 mg/d — ABNORMAL HIGH (ref 10–140)
Total Protein, Urine: 19.2 mg/dL

## 2010-10-18 LAB — HEMOCCULT GUIAC POC 1CARD (OFFICE)
Fecal Occult Bld: NEGATIVE
Fecal Occult Bld: NEGATIVE
Fecal Occult Bld: NEGATIVE

## 2010-10-18 LAB — BASIC METABOLIC PANEL
BUN: 19 mg/dL (ref 6–23)
BUN: 24 mg/dL — ABNORMAL HIGH (ref 6–23)
BUN: 26 mg/dL — ABNORMAL HIGH (ref 6–23)
CO2: 25 mEq/L (ref 19–32)
CO2: 26 mEq/L (ref 19–32)
Calcium: 8.5 mg/dL (ref 8.4–10.5)
Calcium: 8.8 mg/dL (ref 8.4–10.5)
Chloride: 105 mEq/L (ref 96–112)
Chloride: 107 mEq/L (ref 96–112)
Creatinine, Ser: 3.61 mg/dL — ABNORMAL HIGH (ref 0.4–1.2)
Creatinine, Ser: 4.6 mg/dL — ABNORMAL HIGH (ref 0.4–1.2)
Creatinine, Ser: 4.63 mg/dL — ABNORMAL HIGH (ref 0.4–1.2)
Creatinine, Ser: 4.74 mg/dL — ABNORMAL HIGH (ref 0.4–1.2)
GFR calc Af Amer: 11 mL/min — ABNORMAL LOW (ref >60)
GFR calc Af Amer: 12 mL/min — ABNORMAL LOW (ref >60)
GFR calc Af Amer: 15 mL/min — ABNORMAL LOW (ref >60)
GFR calc non Af Amer: 9 mL/min — ABNORMAL LOW (ref >60)
Potassium: 3.5 mEq/L (ref 3.5–5.1)
Potassium: 4 mEq/L (ref 3.5–5.1)

## 2010-10-18 LAB — RETICULOCYTES
Retic Count, Absolute: 36.4 10*3/uL (ref 19.0–186.0)
Retic Ct Pct: 1 % (ref 0.4–3.1)

## 2010-10-18 LAB — DIFFERENTIAL
Basophils Absolute: 0 10*3/uL (ref 0.0–0.1)
Basophils Relative: 1 % (ref 0–1)
Basophils Relative: 1 % (ref 0–1)
Eosinophils Absolute: 0.1 10*3/uL (ref 0.0–0.7)
Eosinophils Absolute: 0.1 10*3/uL (ref 0.0–0.7)
Eosinophils Absolute: 0.1 10*3/uL (ref 0.0–0.7)
Eosinophils Relative: 1 % (ref 0–5)
Eosinophils Relative: 2 % (ref 0–5)
Eosinophils Relative: 2 % (ref 0–5)
Lymphocytes Relative: 26 % (ref 12–46)
Lymphs Abs: 1.4 10*3/uL (ref 0.7–4.0)
Lymphs Abs: 1.6 10*3/uL (ref 0.7–4.0)
Lymphs Abs: 1.9 10*3/uL (ref 0.7–4.0)
Monocytes Absolute: 0.5 10*3/uL (ref 0.1–1.0)
Monocytes Absolute: 0.6 10*3/uL (ref 0.1–1.0)
Monocytes Relative: 7 % (ref 3–12)

## 2010-10-18 LAB — URINALYSIS, MICROSCOPIC ONLY
Bilirubin Urine: NEGATIVE
Ketones, ur: NEGATIVE mg/dL
Nitrite: NEGATIVE
Protein, ur: NEGATIVE mg/dL
pH: 6 (ref 5.0–8.0)

## 2010-10-18 LAB — POCT CARDIAC MARKERS
CKMB, poc: <1 ng/mL — ABNORMAL LOW (ref 1.0–8.0)
Troponin i, poc: <0.05 ng/mL (ref 0.00–0.09)

## 2010-10-18 LAB — COMPREHENSIVE METABOLIC PANEL
ALT: 17 U/L (ref 0–35)
AST: 21 U/L (ref 0–37)
Albumin: 2.9 g/dL — ABNORMAL LOW (ref 3.5–5.2)
Alkaline Phosphatase: 61 U/L (ref 39–117)
BUN: 24 mg/dL — ABNORMAL HIGH (ref 6–23)
Chloride: 106 mEq/L (ref 96–112)
GFR calc Af Amer: 14 mL/min — ABNORMAL LOW (ref >60)
Potassium: 3.8 mEq/L (ref 3.5–5.1)
Sodium: 142 mEq/L (ref 135–145)
Total Protein: 5.8 g/dL — ABNORMAL LOW (ref 6.0–8.3)

## 2010-10-18 LAB — HEPARIN LEVEL (UNFRACTIONATED)
Heparin Unfractionated: 0.29 IU/mL — ABNORMAL LOW (ref 0.30–0.70)
Heparin Unfractionated: 0.35 IU/mL (ref 0.30–0.70)
Heparin Unfractionated: 0.53 IU/mL (ref 0.30–0.70)
Heparin Unfractionated: 0.62 IU/mL (ref 0.30–0.70)
Heparin Unfractionated: 0.82 IU/mL — ABNORMAL HIGH (ref 0.30–0.70)
Heparin Unfractionated: 0.92 IU/mL — ABNORMAL HIGH (ref 0.30–0.70)

## 2010-10-18 LAB — PROTIME-INR: Prothrombin Time: 13.7 seconds (ref 11.6–15.2)

## 2010-10-18 LAB — C3 COMPLEMENT: C3 Complement: 49 mg/dL — ABNORMAL LOW (ref 88–201)

## 2010-10-18 LAB — SODIUM, URINE, RANDOM: Sodium, Ur: 67 mEq/L

## 2010-10-18 LAB — CREATININE, URINE, RANDOM: Creatinine, Urine: 81.2 mg/dL

## 2010-10-18 LAB — IMMUNOFIXATION ADD-ON

## 2010-10-18 LAB — TSH: TSH: 3.68 u[IU]/mL (ref 0.350–4.500)

## 2010-10-18 LAB — CARDIAC PANEL(CRET KIN+CKTOT+MB+TROPI): Relative Index: INVALID (ref 0.0–2.5)

## 2010-10-18 LAB — FERRITIN: Ferritin: 375 ng/mL — ABNORMAL HIGH (ref 10–291)

## 2010-10-20 LAB — BASIC METABOLIC PANEL
CO2: 28 mEq/L (ref 19–32)
Calcium: 8 mg/dL — ABNORMAL LOW (ref 8.4–10.5)
Calcium: 8.7 mg/dL (ref 8.4–10.5)
Chloride: 108 mEq/L (ref 96–112)
Creatinine, Ser: 0.75 mg/dL (ref 0.4–1.2)
Creatinine, Ser: 0.87 mg/dL (ref 0.4–1.2)
GFR calc Af Amer: >60 mL/min (ref >60)
Glucose, Bld: 150 mg/dL — ABNORMAL HIGH (ref 70–99)
Sodium: 141 mEq/L (ref 135–145)

## 2010-10-20 LAB — CK TOTAL AND CKMB (NOT AT ARMC)
CK, MB: 1.2 ng/mL (ref 0.3–4.0)
Relative Index: INVALID (ref 0.0–2.5)
Total CK: 29 U/L (ref 7–177)

## 2010-10-20 LAB — COMPREHENSIVE METABOLIC PANEL WITH GFR
ALT: 21 U/L (ref 0–35)
AST: 20 U/L (ref 0–37)
Alkaline Phosphatase: 72 U/L (ref 39–117)
CO2: 31 meq/L (ref 19–32)
Calcium: 9.2 mg/dL (ref 8.4–10.5)
Chloride: 107 meq/L (ref 96–112)
GFR calc Af Amer: >60 mL/min (ref >60)
GFR calc non Af Amer: >60 mL/min (ref >60)
Glucose, Bld: 228 mg/dL — ABNORMAL HIGH (ref 70–99)
Potassium: 3.9 meq/L (ref 3.5–5.1)
Sodium: 144 meq/L (ref 135–145)
Total Bilirubin: 0.6 mg/dL (ref 0.3–1.2)

## 2010-10-20 LAB — CBC
HCT: 41.7 % (ref 36.0–46.0)
Hemoglobin: 14.3 g/dL (ref 12.0–15.0)
MCHC: 34.3 g/dL (ref 30.0–36.0)
MCV: 96.7 fL (ref 78.0–100.0)
MCV: 96.8 fL (ref 78.0–100.0)
Platelets: 120 10*3/uL — ABNORMAL LOW (ref 150–400)
RBC: 3.85 MIL/uL — ABNORMAL LOW (ref 3.87–5.11)
RBC: 4.31 MIL/uL (ref 3.87–5.11)
RDW: 13.8 % (ref 11.5–15.5)
WBC: 7.8 K/uL (ref 4.0–10.5)
WBC: 9.3 10*3/uL (ref 4.0–10.5)

## 2010-10-20 LAB — GLUCOSE, CAPILLARY
Glucose-Capillary: 102 mg/dL — ABNORMAL HIGH (ref 70–99)
Glucose-Capillary: 109 mg/dL — ABNORMAL HIGH (ref 70–99)
Glucose-Capillary: 109 mg/dL — ABNORMAL HIGH (ref 70–99)
Glucose-Capillary: 110 mg/dL — ABNORMAL HIGH (ref 70–99)
Glucose-Capillary: 112 mg/dL — ABNORMAL HIGH (ref 70–99)
Glucose-Capillary: 115 mg/dL — ABNORMAL HIGH (ref 70–99)
Glucose-Capillary: 121 mg/dL — ABNORMAL HIGH (ref 70–99)
Glucose-Capillary: 122 mg/dL — ABNORMAL HIGH (ref 70–99)
Glucose-Capillary: 123 mg/dL — ABNORMAL HIGH (ref 70–99)
Glucose-Capillary: 130 mg/dL — ABNORMAL HIGH (ref 70–99)
Glucose-Capillary: 132 mg/dL — ABNORMAL HIGH (ref 70–99)
Glucose-Capillary: 133 mg/dL — ABNORMAL HIGH (ref 70–99)
Glucose-Capillary: 134 mg/dL — ABNORMAL HIGH (ref 70–99)
Glucose-Capillary: 143 mg/dL — ABNORMAL HIGH (ref 70–99)
Glucose-Capillary: 145 mg/dL — ABNORMAL HIGH (ref 70–99)
Glucose-Capillary: 148 mg/dL — ABNORMAL HIGH (ref 70–99)
Glucose-Capillary: 158 mg/dL — ABNORMAL HIGH (ref 70–99)
Glucose-Capillary: 166 mg/dL — ABNORMAL HIGH (ref 70–99)
Glucose-Capillary: 184 mg/dL — ABNORMAL HIGH (ref 70–99)
Glucose-Capillary: 187 mg/dL — ABNORMAL HIGH (ref 70–99)
Glucose-Capillary: 187 mg/dL — ABNORMAL HIGH (ref 70–99)
Glucose-Capillary: 189 mg/dL — ABNORMAL HIGH (ref 70–99)
Glucose-Capillary: 193 mg/dL — ABNORMAL HIGH (ref 70–99)
Glucose-Capillary: 227 mg/dL — ABNORMAL HIGH (ref 70–99)
Glucose-Capillary: 280 mg/dL — ABNORMAL HIGH (ref 70–99)
Glucose-Capillary: 96 mg/dL (ref 70–99)

## 2010-10-20 LAB — HEMOGLOBIN A1C
Hgb A1c MFr Bld: 8 % — ABNORMAL HIGH (ref <5.7)
Mean Plasma Glucose: 183 mg/dL — ABNORMAL HIGH (ref <117)

## 2010-10-20 LAB — DIFFERENTIAL
Basophils Absolute: 0 10*3/uL (ref 0.0–0.1)
Basophils Relative: 0 % (ref 0–1)
Eosinophils Absolute: 0.2 K/uL (ref 0.0–0.7)
Eosinophils Relative: 2 % (ref 0–5)
Lymphocytes Relative: 25 % (ref 12–46)
Lymphs Abs: 2 K/uL (ref 0.7–4.0)
Monocytes Absolute: 0.5 10*3/uL (ref 0.1–1.0)
Monocytes Relative: 7 % (ref 3–12)
Neutro Abs: 5.1 10*3/uL (ref 1.7–7.7)
Neutrophils Relative %: 65 % (ref 43–77)

## 2010-10-20 LAB — COMPREHENSIVE METABOLIC PANEL
ALT: 17 U/L (ref 0–35)
AST: 13 U/L (ref 0–37)
Albumin: 3.1 g/dL — ABNORMAL LOW (ref 3.5–5.2)
Albumin: 3.4 g/dL — ABNORMAL LOW (ref 3.5–5.2)
Alkaline Phosphatase: 63 U/L (ref 39–117)
BUN: 10 mg/dL (ref 6–23)
Calcium: 8.5 mg/dL (ref 8.4–10.5)
Creatinine, Ser: 0.78 mg/dL (ref 0.4–1.2)
GFR calc Af Amer: >60 mL/min (ref >60)
Glucose, Bld: 202 mg/dL — ABNORMAL HIGH (ref 70–99)
Potassium: 3.5 mEq/L (ref 3.5–5.1)
Sodium: 139 mEq/L (ref 135–145)
Total Protein: 5.8 g/dL — ABNORMAL LOW (ref 6.0–8.3)
Total Protein: 6.5 g/dL (ref 6.0–8.3)

## 2010-10-20 LAB — BASIC METABOLIC PANEL WITH GFR
BUN: 9 mg/dL (ref 6–23)
Chloride: 106 meq/L (ref 96–112)
GFR calc Af Amer: >60 mL/min (ref >60)
GFR calc non Af Amer: >60 mL/min (ref >60)
Potassium: 3.5 meq/L (ref 3.5–5.1)
Sodium: 141 meq/L (ref 135–145)

## 2010-10-20 LAB — LIPID PANEL
Cholesterol: 181 mg/dL (ref 0–200)
HDL: 22 mg/dL — ABNORMAL LOW (ref >39)
Total CHOL/HDL Ratio: 8.2 RATIO
Triglycerides: 212 mg/dL — ABNORMAL HIGH (ref <150)

## 2010-10-20 LAB — MRSA PCR SCREENING: MRSA by PCR: NEGATIVE

## 2010-10-20 LAB — APTT: aPTT: 28 seconds (ref 24–37)

## 2010-10-20 LAB — PROTIME-INR
INR: 1.07 (ref 0.00–1.49)
Prothrombin Time: 13.8 s (ref 11.6–15.2)

## 2010-10-20 LAB — TROPONIN I: Troponin I: 0.02 ng/mL (ref 0.00–0.06)

## 2010-11-07 LAB — BASIC METABOLIC PANEL
CO2: 28 mEq/L (ref 19–32)
Calcium: 8.6 mg/dL (ref 8.4–10.5)
Creatinine, Ser: 0.8 mg/dL (ref 0.4–1.2)
Glucose, Bld: 154 mg/dL — ABNORMAL HIGH (ref 70–99)

## 2010-11-07 LAB — CBC
MCHC: 34.3 g/dL (ref 30.0–36.0)
RDW: 13.6 % (ref 11.5–15.5)

## 2010-11-07 LAB — GLUCOSE, CAPILLARY
Glucose-Capillary: 144 mg/dL — ABNORMAL HIGH (ref 70–99)
Glucose-Capillary: 176 mg/dL — ABNORMAL HIGH (ref 70–99)
Glucose-Capillary: 227 mg/dL — ABNORMAL HIGH (ref 70–99)

## 2010-12-17 NOTE — Cardiovascular Report (Signed)
NAMEALICEN, [name]               ACCOUNT NO.:  [id number]   MEDICAL RECORD NO.:  [id number]          PATIENT TYPE:  AMB   LOCATION:  SDS                          FACILITY:  [facility]   PHYSICIAN:  Nanetta Batty, M.D.   DATE OF BIRTH:  03-14-1946   DATE OF PROCEDURE:  09/15/2007  DATE OF DISCHARGE:                            CARDIAC CATHETERIZATION   PROCEDURE PERFORMED:  Peripheral angiogram.   CARDIOLOGIST:  Nanetta Batty, M.D.   HISTORY:  The patient is a 65 year old moderately overweight Caucasian  female with a history of hypertension, adult onset diabetes,  dyslipidemia, continued tobacco abuse, and peripheral vascular artery  disease with claudication.  She had a nonischemic Myoview performed in  June 2008 with lower extremity angiogram performed January 25, 2007  revealing an occluded left SFA with two-vessel runoff and moderate  segmental disease mid-right SFA, which I stented.  She had three-vessel  runoff from that leg.  Follow up Dopplers revealed an increase in her  ABIs from 0.54 to 0.63.  Her symptoms of claudication improved as well;  though, she does have lower extremity edema and some venous ulcers.  Follow up Dopplers have shown worsening of her velocities within her leg  stented segment as well as worsening claudication.  She presents now for  re-angiography and potential reintervention.   DESCRIPTION OF THE PROCEDURE:  The patient was brought to the second  floor [facility] PV angiographic suite in the postabsorptive state.  She  was premedicated with by mouth Valium, IV fentanyl, Versed and morphine.  Both groins were prepped and shaved in the usual sterile fashion.  One  percent  Xylocaine was used for local anesthesia.  Attempts were made to  access the right common femoral artery using the standard Seldinger  technique; however, the wire never was able to enter the common femoral  and a stent selected branch __________  vessels.  Because of this the  right  femoral was accessed and an abdominal aortogram with bifemoral  runoff was obtained.  Visipaque dye was used for the entirety of the  case.  Articular and aortic pressures were monitored during the case.  The patient was put on IV nitro for blood pressure control.   ANGIOGRAPHIC RESULTS:  1. Abdominal aorta:      a.     Renal arteries - 50% intraluminal filling defects within the       proximal left renal artery.      b.     Moderate infrarenal abdominal aortic atherosclerotic       narrowing just above the aortic bifurcation.  2. Left lower extremity:      a.     Fifty percent distal left common femoral artery stenosis.      b.     Total left SFA arterial vessel runoff with reconstitution in       __________ .  3. Right lower extremity:      a.     The patient had proximal right SFA stenosis.  There was 90%       proximal in-stent restenosis within the midright SFA stent.  There was 90% distal right SFA stenosis with three-vessel runoff.   IMPRESSION:  This patient has early in-stent restenosis with a lesion  down the distal superficial femoral artery and recurrent claudication  with venous ulceration.  I am unsure why I was unable to access left  common femoral artery, though there is a moderate noncritical lesion  there.   PLAN:  The plans will be to discharge her home in the morning.  I will  bring her back in one or two weeks for attempts to reaccess the left  common femoral artery to revascularize the right SFA.   The patient left lab in stable condition.      Nanetta Batty, M.D.  Electronically Signed     JB/MEDQ  D:  09/15/2007  T:  09/17/2007  Job:  [id number]   cc:   Sixth Floor [facility] Angiographic Suite  [facility]  Ernestina Penna, M.D.

## 2010-12-17 NOTE — Discharge Summary (Signed)
[name], [name] NO.:  [id number]   MEDICAL RECORD NO.:  [id number]          PATIENT TYPE:  INP   LOCATION:  2010                         FACILITY:  [facility]   PHYSICIAN:  Nanetta Batty, M.D.   DATE OF BIRTH:  [birth year]   DATE OF ADMISSION:  10/12/2007  DATE OF DISCHARGE:  10/13/2007                               DISCHARGE SUMMARY   HISTORY OF PRESENT ILLNESS:  Ms. Josefa is a 65 year old white female  with prior.  Hypertension, hyperlipidemia, NIDDM, tobacco use and  peripheral vascular disease who came into the hospital for PV angiogram.  She had been seen by Dr. Allyson Sabal in the office in late January.  She was  complaining about claudication.  Apparently, Dopplers showed early re-  stenosis of the right SFA artery stent with progressive claudication and  nonhealing wound.  Thus, he began her on Plavix and scheduled her to  have a PV angio.  Thus, she came into the hospital.  She was found to  have 90% mid right SFA in-stent restenosis.  She underwent PTA, and she  also had 90% distal SFA PTA and stenting reduced from 90% to zero.  She  had a second stent placed EverFlex 6 x 20 placed above the mid SFA stent  that had in-stent restenosis.  She was noted to have 80% left common  femoral artery occlusive disease that she may require left CFA patch  angioplasty, atherectomy.  She was seen by Dr. Allyson Sabal on October 13, 2007,  considered stable for discharge home.  He wants to continue her aspirin  and Plavix.  She will have lower extremity Dopplers as an outpatient.   LABORATORY DATA:  Her labs on the morning of October 13, 2007, reveals  hemoglobin 12.3, hematocrit 35.7, WBC 7.2 and platelets 132.  Her sodium  was 139, potassium 4.0, chloride 107, CO2 28, BUN 9, creatinine 0.80.  Glucose was 170, INR was 1.0, calcium was 8.4.  Blood pressure was  159/59, pulse was 55.   DISCHARGE MEDICATIONS:  1. Glucophage 500 mg, she will hold until Friday morning.  2. Aspirin 81  mg once a day.  3. Vytorin 10/10 at bedtime.  4. Ramipril 10 mg one a day.  5. Niaspan 500 mg at bedtime.  6. Travatan/HCT 600/12.5 every day.  7. Singulair 10 mg once a day.  8. Plavix 75 mg once a day.  9. Gas-X on a p.r.n. basis.  10.Hydrocodone p.r.n.   DISCHARGE DIAGNOSES:  1. Claudication, nonhealing wound.  2. ASCVD with history of a mid SFA stent this admission with in-stent      restenosis reduced to zero with PTA.  She also had a stent placed,      EverFlex 6 x 2.  She had a distal right SFA stent placed to 6 x 3.  3. She has residual with disease in the left SFA that she may in the      future need patch angioplasty and enterectomy.  4. NIDDM.  5. Hypertension.  6. Hyperlipidemia.      Lezlie Octave, [place].P.  Nanetta Batty, M.D.  Electronically Signed    BB/MEDQ  D:  10/13/2007  T:  10/14/2007  Job:  [id number]   cc:   Olena Leatherwood [facility] [place], [place]

## 2010-12-17 NOTE — Cardiovascular Report (Signed)
[name], RUEGER NO.:  [id number]   MEDICAL RECORD NO.:  [id number]          PATIENT TYPE:  OBV   LOCATION:  2807                         FACILITY:  [facility]   PHYSICIAN:  Nanetta Batty, M.D.   DATE OF BIRTH:  Dec 17, 1945   DATE OF PROCEDURE:  DATE OF DISCHARGE:                            CARDIAC CATHETERIZATION   [name] is a 65 year old mildly overweight white female, who I see for  claudication.  [name] has a history of hypertension, adult onset diabetes,  dyslipidemia and ongoing tobacco abuse.  [name] has claudication, right  greater than left, with Dopplers in our the office revealing ABI of 0.54  on the right and ,3 on the left.  [name] is apparently by duplex had a  total left SFA.  [name] presents now for angiography and potential  intervention.   DESCRIPTION OF PROCEDURE:  The patient was brought to the second floor  [facility] angiographic suite in the postabsorptive state.  [name] was pre-  medicated with p.o. Valium.  Her left groin was prepped and shaved in  the usual sterile fashion.  Xylocaine 1% was used for local anesthesia.  A 5-French sheath was inserted into the right femoral artery using  standard Seldinger technique, and a Smart Doppler tip needle because of  difficulty palpating a pulse.  A 5-French ___________ catheter was used  for midstream distal abdominal aortography with bi-femoral runoff.  Visipaque dye was used for the entirety of the case.  Retrograde aortic  pressure was monitored in the case.   ANGIOGRAPHIC RESULTS:  1. Abdominal aorta.      a.     Renal arteries - normal.      b.     Infrarenal abdominal aorta:  Moderate atherosclerotic       narrowing tapering towards the iliac bifurcation of approximate       30%.  2. Left lower extremity;      a.     Forty percent distal left common and proximal left external       iliac artery stenosis.      b.     Total SFA at the origin with reconstitution in the       ___________ profunda  femoris collaterals.      c.     Two-vessel runoff and occluded peroneal.  3. Right lower extremities;      a.     Thirty to forty percent ostial right common iliac artery       stenosis,      b.     Sixty percent segmental mid-right SFA with a 40-mm pullback       gradient using a 5-French slip cath.      c.     A three-vessel runoff.   IMPRESSION:  What appears be hemodynamically significant mid-right SFA  stenosis.  The Doppler studies suggest high frequency signal in this  area.  Her right leg is more symptomatic than the left; despite the fact  that [name] has a total left SFA, which fills by collaterals.  We will  proceed with PTA and  stenting.   The patient received 3,000 units of heparin intravenously.  Contralateral access was obtained with crossover catheter, a 3-5 wooly  wire and a 6-French crossover trimmer sheath.  The wooly wire was then  advanced past the lesion.  A 5-French slip catheter was advanced past  the lesion..  Pullback revealed a 40-mm trans-stenotic gradient.   The wire was then passed back across the lesion, and pre-dilatation  performed with a 5-2 Powerflex.  Stenting was performed with a 6-4 Smart  stent, a post dilatation with a 06/04 Powerflex at 4 atmospheres,  resulting in reduction of 6% segmental mid-right SFA stenosis to 0%  residual.  The patient tolerated the procedure well.  The guidewire and  catheter were removed.  The sheath was removed and pressure was held to  the groin to achieve hemostasis.  The  patient left the lab in stable condition, appears to be hydrated  tonight, discharge home in the morning.  [name] will get follow-up Dopplers  and ABIs.  I have asked patient to see me back in the office for follow-  up.  [name] left the lab in stable condition.      Nanetta Batty, M.D.  Electronically Signed     JB/MEDQ  D:  01/25/2007  T:  01/25/2007  Job:  [id number]   cc:   Chart  [facility]iographic [facility] - 2nd fl  [facility]  Ernestina Penna, M.D.

## 2010-12-17 NOTE — Procedures (Signed)
NAMEARDELLE, HALIBURTON NO.:  [id number]   MEDICAL RECORD NO.:  [id number]          PATIENT TYPE:  INP   LOCATION:  2807                         FACILITY:  [facility]   PHYSICIAN:  Nanetta Batty, M.D.   DATE OF BIRTH:  15-Jun-1946   DATE OF PROCEDURE:  DATE OF DISCHARGE:                    PERIPHERAL VASCULAR INVASIVE PROCEDURE   Ms. Kaatz is a 65 year old moderately overweight Caucasian female with a  history of PVOD, status post right SFA PTA and stenting with  rehabilitation for in-stent restenosis 1 year ago.   She has hypertension, hyperlipidemia, insulin-requiring diabetes,  continued tobacco abuse.  Her ABIs have worsened and she has developed  resting limb ischemia with ABIs in the 0.2 range.  She did have a  Myoview which showed new anteroseptal and anteroapical ischemia.  After  having undergone diagnostic coronary arteriography, she now presents for  peripheral angiography.   PROCEDURE DESCRIPTION:  Using existing 5-French sheath in the right  femoral artery, a 5-French pigtail catheter and right Judkins catheter,  abdominal aortography with bifemoral runoff as well as selective left  renal artery angiography were performed.  Visipaque dye was used for the  entirety of the case.  Retrograde pressure was monitored during the  case.   ANGIOGRAPHIC RESULTS:  1. Abdominal aorta:      a.     Renal arteries - 60% ostial left renal artery stenosis with       what appears to be either chronic dissection or a filling defect.       This was present at the abdominal aortogram as well.      b.     Infrarenal abdominal aorta - moderate atherosclerotic       changes  2. Left lower extremity:      a.     A 60% left common femoral artery stenosis.      b.     Total SFA with reconstitution in Hunter's canal and 3 vessel       runoff.  3. Right lower extremity:      a.     Total SFA with reconstitution in Hunter's canal and 3 vessel       runoff.   IMPRESSION:  Ms.  Udall has severe hypertension and moderate left renal  artery stenosis, as well as bilateral superficial femoral artery  occlusion.  She is symptomatic from this.  I do not think she a  candidate for percutaneous reintervention at this time.  She may be a  candidate for right femoral-popliteal bypass grafting.  We will need to  address her coronary status as well.  Because of the amount of dye used  as well as the fact that she had a hematoma  on the left side, we will follow her labs, hydrate her and check blood  work in the morning.  The sheath was removed and pressure was held on the groin to achieve  hemostasis.  The patient left the lab in stable condition.      Nanetta Batty, M.D.  Electronically Signed     JB/MEDQ  D:  05/18/2009  T:  05/18/2009  Job:  [id number]   cc:   [facility]  Second Floor PV Angiographic Suite  Vernon Prey, MD

## 2010-12-17 NOTE — Assessment & Plan Note (Signed)
OFFICE VISIT   [name], [name]  DOB:  [birth year]                                        June 20, 2010  CHART #:  [id number]   HISTORY:  The patient returns to the office today in followup after her  coronary artery bypass grafting x5 done on May 22, 2010.  She is a  65 year old female with a longstanding history of diabetes, history of  acute renal failure, recent stroke, peripheral vascular disease, and  cerebrovascular disease, but had been managed medically until she re-  presented in October with recurrent anginal pain.  She underwent  coronary artery bypass grafting x5 and considering her preoperative  condition, has done remarkably well.  She ultimately was discharged home  to a rehab center which she notes she could not stand and stayed 2 days  and has been cared for home since.  She is increasing her activity  appropriately.  She has had no overt symptoms of congestive heart  failure.  She has seen Dr. Lynnea Ferrier and a loop recorder was placed.   PHYSICAL EXAMINATION:  Her blood pressure 144/72, pulse is 72,  respiratory rate is 18, and O2 sats 97%.  Her sternum is stable and well  healed.  Her lungs are clear.  The Steri-Strips were removed and  underneath it was well healed.  She has some thickening of the right  thigh at the endovein harvest site, it is clear on the left.  She has  chronic venous stasis changes in both lower extremities without edema.   DIAGNOSTIC TESTS:  Followup chest x-ray shows clear lung fields  bilaterally with complete resolution of atelectasis and effusions that  were present early postoperatively.   Overall, I am very pleased with her progress.  I have encouraged her to  enroll in the cardiac rehab program in the next week or two.  She  continues to see Dr. Lynnea Ferrier in regard to her rhythm and a loop recorder  is in place.   MEDICATIONS:  She continues on Coumadin, is managed by [facility], also  on Crestor, hydralazine, Nu-Iron, Nexium, amiodarone,  lutein, Niaspan, lisinopril, vitamin D, metoclopramide, Lantus,  doxycycline, and oxycodone p.r.n.   IMPRESSION:  Overall, I am very pleased with her progress.  I have not  made a specific appointment for her to return to see me, but I would be  glad to see her at anytime.  Over time, her blood pressure will  gradually increase to its preoperative levels and she will need  titration of her blood pressure medicine.   Sheliah Plane, MD  Electronically Signed   EG/MEDQ  D:  06/20/2010  T:  06/21/2010  Job:  [id number]   cc:   Nanetta Batty, M.D.  Broadus John T. Pamalee Leyden, MD

## 2010-12-17 NOTE — Assessment & Plan Note (Signed)
OFFICE VISIT   [name], [name]  DOB:  August 18, 1945                                       06/18/2009  ZOXWR#:[id number]   REASON FOR VISIT:  Right leg claudication.   REFERRING PHYSICIAN:  Dr. Allyson Sabal   PRIMARY PHYSICIAN:  Ernestina Penna, M.D.   HISTORY:  [name] is a 65 year old female I am seeing at the request  of Dr. Allyson Sabal for evaluation of right leg pain.  She has undergone  previous stenting of her right superficial femoral artery.  Her stents  have now occluded.  Her ABI is 0.18 on the right and 0.29 the left.  She  does have a small ulceration on the posterior lateral aspect of her leg  that has been there for awhile.  She complains of some chronic swelling  in both legs.  The patient is a known diabetic since age 28.  She also  suffers from hypertension and hypercholesterolemia, both which have been  medically managed.  She does state that she can walk approximately 1  block prior to having claudication-like symptoms.  She is awoken in the  middle of the night with a burning feeling in her leg.   REVIEW OF SYSTEMS:  GENERAL:  Positive for weight loss.  CARDIAC:  Positive for heart murmur.  PULMONARY:  She is on CPAP for sleep apnea.  GI:  Positive for constipation.  GU:  Negative.  VASCULAR:  Positive for  pain in legs with walking and burning with nonhealing ulcers.  NEURO:  Negative.  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  PSYCH:  Negative.  ENT:  Negative.  HEM:  Negative.  SKIN:  Positive for  rash beneath her breast.   PAST MEDICAL HISTORY:  1. Diabetes, age of onset 58, not on insulin.  2. Hypertension.  3. Hypercholesterolemia.  4. Coronary artery disease.   FAMILY HISTORY:  Positive for cancer in her mother and father.  Her  brother has undergone bypass surgery.   SOCIAL HISTORY:  She is single with no children.  She is disabled.  She  smokes 2 cigarettes per week, trying to quit.  Alcohol negative.   MEDICATIONS:  Please see chart.   ALLERGIES:  None.   PHYSICAL EXAMINATION:  Heart rate 56, blood pressure 130/80,  respirations 20.  GENERAL:  She is well appearing in no acute distress.  HEENT:  Normocephalic, atraumatic.  Pupils equal.  Sclerae anicteric.  NECK:  Supple.  No JVD.  LUNGS:  Clear bilaterally.  CARDIOVASCULAR:  Regular rate and rhythm.  ABDOMEN:  Obese.  EXTREMITIES:  Warm.  She has bilateral edema with skin changes up to the  knee.  There is a 1 cm ulcer with granulation tissue at its base on the  posterior lateral aspect of the right leg.  Pedal pulses are not  palpable.   ARTERIOGRAM:  I have reviewed her arteriogram.  This reveals occlusion  of her right superficial femoral artery with reconstitution at the level  of the patella.   ASSESSMENT:  Right leg pain.   PLAN:  I think the patient's pain is multifactorial.  I do think she has  a neurologic and neuropathic component to her pain.  I told her that I  am very concerned about proceeding with bypass on her given the edema in  her leg.  I would be very concerned that she would have problems healing  an incision that extended below her knee and to this toe draws type skin  changes due to her edema.  Her biggest complaint right now is the  burning.  I think that we should just her neuropathic medications to see  if she receives any benefit.  In all likelihood, however, she will  probably come to bypass as I am concerned that she may not be able to  heal this ulcer.  For that reason, I am going to have her come back to  see me in 1 month.  She is going to be referred to [facility] to  help get compression to help heal her wound as I would think her chronic  swelling probably is contributing to this.  I would probably recommend  proceeding with coronary intervention at this time as she will likely  come to bypass within the next 1-2 months.  Hopefully, I could keep this  to the above-knee popliteal  artery.  However, this may be somewhat  challenging given the location of her stents.  I will plan on seeing her  back in 1 month.   Jorge Ny, MD  Electronically Signed   VWB/MEDQ  D:  06/18/2009  T:  06/19/2009  Job:  [id number]   cc:   Nanetta Batty, M.D.  Ernestina Penna, M.D.

## 2010-12-17 NOTE — Cardiovascular Report (Signed)
NAMEMarland [name]  RAHI, CHANDONNET NO.:  [id number]   MEDICAL RECORD NO.:  [id number]          PATIENT TYPE:  INP   LOCATION:  2010                         FACILITY:  [facility]   PHYSICIAN:  Nanetta Batty, M.D.   DATE OF BIRTH:  [birth year]   DATE OF PROCEDURE:  DATE OF DISCHARGE:  10/13/2007                            CARDIAC CATHETERIZATION   HISTORY:  Ms. [name] is a 65 year old mildly overweight Caucasian female  with history hypertension, hyperlipidemia, adult onset diabetes,  continued tobacco abuse and PVOD with claudication.  She had a negative  Myoview June 2008.  I performed angiography on her January 15, 2007  revealing occluded left SFA with two-vessel runoff filling by  collaterals, 60 percent segmental mid-right SFA stenosis which I stented  using a 6 x 4 Smart stent.  She enjoyed improvement in her symptoms for  several months until 3 months ago when she developed an ulcer on her  right leg.  Angiography performed August 18, 2007 revealed in-stent  restenosis of the distal right SFA stenosis as well.  I was unable to  access her left SFA because of a common femoral lesion.  She presents  now for a reattempt at left common femoral access for percutaneous  revascularization of her left SFA for claudication and critical limb  ischemia.   DESCRIPTION OF PROCEDURE:  The patient was brought to the second floor  [facility] angiographic suite in the postabsorptive state.  She was  premedicated with p.o. Valium, IV Versed and fentanyl.  Her left groin  was prepped and draped in the usual sterile fashion.  Xylocaine 1% was  used for local anesthesia.  A 6-French sheath was inserted into the left  femoral artery using standard Seldinger technique under direct  fluoroscopic visual control.  There was initial difficulty in crossing  the common femoral lesion, but ultimately the Wooly prolapsed across and  was advanced easily into the aorta where it miraculously crossed the  iliac bifurcation and was able to be advanced down the right iliac.  The  6-French Terumo crossover sheath was then advanced over the wire over  the iliac bifurcation into the right external iliac.  The patient  received 3000 units of heparin intravenously.  Visipaque dye was used  through the entirety of the case.  Retrograde aortic pressures were  monitored during the case.   ANGIOGRAPHIC RESULTS:  There was a 90% in-stent restenosis within the  proximal edge of the mid-right SFA stent with 50-60% diffuse in-stent  restenosis noted in the main body of the stent.  There was also a 90%  fairly focal stenosis at the junction of the right distal SFA and  popliteal in Hunter's canal with three-vessel runoff.  The Wooly wire  was then advanced into below the knee popliteal.  A 42-Powerflex was  then advanced over the [facility] wire and dilated the distal SFA and the mid  SFA within the stent at nominal pressures.  Following this, a 6 x 3  Protege Abbott nitinol self-expanding stent was then deployed under  direct fluoroscopic control in the distal right SFA segment.  A 6 x 2  Protege in the mid-right SFA at the proximal edge of the previously  placed stent.  Both of these were then  post-dilated with a 5 x 2 Powerflex with nominal pressures from reducing  a distal 90% right SFA stenosis to 0% residual and a proximal edge in-  stent restenosis from 90% to zero.  The patient tolerated the procedure  well.  Angiography through the side-arm sheath of the left common  femoral revealed the sheath to be occlusive.  The ACT was measured at  191.  The sheath was then removed in the cath lab and gentle pressure  was held to achieve  hemostasis.  The patient left the lab in stable condition.  She will be  hydrated overnight, discharged home in the morning with follow up  Dopplers and ABIs in our office prior to seeing me back in followup.  She may require right common femoral endarterectomy and patch   angioplasty.  She left the lab in stable condition.      Nanetta Batty, M.D.  Electronically Signed     JB/MEDQ  D:  10/12/2007  T:  10/13/2007  Job:  [id number]   cc:   [facility]  Candie Echevaria, MD

## 2010-12-17 NOTE — Assessment & Plan Note (Signed)
OFFICE VISIT   ENAS, WINCHEL  DOB:  1945/12/14                                       07/23/2009  ZOXWR#:[id number]   REASON FOR VISIT:  Follow-up right leg wound.   HISTORY:  This is a 65 year old female who had previously undergone  stenting of a right superficial femoral artery.  Her stents have now  occluded.  Her ABI is decreased to 0.18.  She has a small ulceration on  the posterior lateral aspect of her right leg that has been there for  awhile.  She has chronic edema in both legs.  I had recently sent her to  the wound center to help with her edema.  She has been placed in a Conservator, museum/gallery.  She states that she is doing much better with the [employer].  The  swelling is down, and she feels like the ulcer size has decreased.  She  not had any fevers, chills, nausea, vomiting.  She does complain of some  neuropathic pain, consisting of burning in the calf region.   PHYSICAL EXAMINATION:  Blood pressure 180/66, heart 61, temperature is  97.8.  GENERAL:  She is well-appearing in no distress.  She is  normocephalic, atraumatic.  Pupils equal.  Sclerae anicteric.  CARDIOVASCULAR:  Regular rate and rhythm.  Respirations are nonlabored.  EXTREMITIES:  Prominent edema bilaterally, left greater than right.  The  ulcer on the posterior aspect of the right calf is now down to less than  1 cm.  There is no evidence of infection.   ASSESSMENT/PLAN:  Right leg ulcer.   PLAN:  Again, I reiterated to the patient that because of her swelling,  I did not feel that she is a good candidate for a bypass.  I think that  she would most likely require a below-knee bypass, and I think she would  have extreme difficulty healing her below-knee incision due to the edema  she has in her right leg.  Fortunately, her edema has been able to be  controlled with Unna boot, and the ulcer on her right leg has been  improving.  I think we can continue to observe this with local wound  care.  The burning feeling she describes in her right calf, I think, is  more neuropathic, and I do not think this will be aided by a bypass.  She has had some improvement in symptoms by her switch from Neurontin to  Lyrica.  I plan on seeing her back in 6 weeks.     Jorge Ny, MD  Electronically Signed   VWB/MEDQ  D:  07/23/2009  T:  07/24/2009  Job:  [id number]   cc:   Nanetta Batty, M.D.  Ernestina Penna, M.D.

## 2010-12-17 NOTE — Cardiovascular Report (Signed)
[name], [name] NO.:  [id number]   MEDICAL RECORD NO.:  [id number]          PATIENT TYPE:  AMB   LOCATION:  SDS                          FACILITY:  [facility]   PHYSICIAN:  Nanetta Batty, M.D.   DATE OF BIRTH:  October 03, 1945   DATE OF PROCEDURE:  06/26/2008  DATE OF DISCHARGE:                            CARDIAC CATHETERIZATION   INDICATIONS:  [name] is a 65 year old mildly overweight female with  history of hypertension, hyperlipidemia as well as diabetes and  continued tobacco abuse of one-half pack per day.  She does have PAD  with stenting of her right SFA and popliteal.  She has a known occluded  left SFA with a high-grade eccentric left common femoral artery  stenosis.  Left leg is asymptomatic.  She has noticed increasing  claudication of her right leg and Dopplers suggested in-stent  restenosis.  She presents now for angiography and re-intervention for  functional limiting claudication.   DESCRIPTION OF PROCEDURE:  The patient was brought to the Second Floor  [facility] Angiographic Suite in a postabsorptive state.  She was  premedicated with p.o. Valium, IV Versed, and fentanyl.  Her left groin  was prepped and shaved in the usual sterile fashion.  Xylocaine 1% was  used for local anesthesia.  A 30-cm long Terumo crossover sheath was  inserted into the left femoral artery under direct fluoroscopic control  using an 0.035 Wholey wire and standard Seldinger technique.  Contralateral access was obtained.  The patient was anticoagulated with  2500 units of heparin intravenously.  Blue wire was then placed across  the SFA and popliteal stents into the below-the-knee popliteal artery.  PTA was performed of the popliteal stent for in-stent restenosis using  a 5 x 20 long Powerflex and at the SFA stent using this same balloon for  in-stent restenosis.  A long EV3 Protege 6 x 150 mm nitinol self-  expanding stent was then placed in the popliteal artery stent and  extended into the SFA.  A 6 x 40 was then overlapped with this to extend  beyond the SFA stent into the proximal SFA.  The entire segment was post-  dilated with a 5 x 10 Powerflex at 4-5 atmospheres resulting in  reduction of a 95% popliteal in-stent restenosis to 0% residual, 95%  SFA in-stent restenosis to 0% residual, and long 75% segmental mid SFA  stenosis to 0% residual with excellent runoff.  The patient tolerated  the procedure well.  The sheath was then withdrawn back across the iliac  bifurcation.  The ACT was measured.  The sheath was removed.  Pressure  was held on the groin to achieve hemostasis.  She will be gently  hydrated, treated with aspirin and Plavix, and discharged to home in the  morning.  She will get followup Dopplers and ABIs and we will see her  back in the office for followup after that.  The patient left the lab in  stable condition.      Nanetta Batty, M.D.  Electronically Signed     JB/MEDQ  D:  06/26/2008  T:  06/26/2008  Job:  [id number]   cc:   Second Floor [facility] Angiographic Suite  [facility]  [place] Duff, [place]  Ernestina Penna, M.D.

## 2010-12-17 NOTE — H&P (Signed)
NAMERAMYAH, PANKOWSKI               ACCOUNT NO.:  [id number]   MEDICAL RECORD NO.:  [id number]          PATIENT TYPE:  AMB   LOCATION:  SDS                          FACILITY:  [facility]   PHYSICIAN:  Nanetta Batty, M.D.   DATE OF BIRTH:  07-Dec-1945   DATE OF ADMISSION:  06/26/2008  DATE OF DISCHARGE:                              HISTORY & PHYSICAL   CHIEF COMPLAINT:  Right lower extremity claudication.   HPI:  [name] is a pleasant 65 year old female who has been followed  by Dr. Allyson Sabal for peripheral vascular disease.  She also has hypertension  and diabetes, dyslipidemia, smoking, and obesity.  She has had a  nonischemic Myoview which was in June of 2008 with preserved LV function  by echo.  Lower extremity PV angiogram in June of 2008 revealing  occluded left SFA with 2-vessel runoff and moderate disease in the right  SFA.  She underwent intervention of the right SFA to 2 sites.  She was  seen in followup in the office for Dopplers on May 19, 2008.  There  was significant arterial insufficiency by ABIs with an ABI of 0.87 on  the left and 0.87 on the right.  She saw Dr. Allyson Sabal in the office May 24, 2008.  She admitted to worsening claudication in the right lower  extremity over the last couple weeks.  Plan was to set her up for re-  intervention.  She was seen in followup on June 20, 2008 and  indicated that there was no change in her symptoms or medical condition.  She understands the risks and benefits of the procedure and is willing  to proceed.  She is admitted now.  In the interim, there has been no  change in her medical history.  She denies any new leg pain or fever or  chills, chest pain.   HOME MEDICATIONS:  1. Metformin 500 mg daily which is currently being held.  2. Lipitor 40 mg a day.  3. Ramipril 10 mg a day.  4. Plavix 75 mg a day.  5. Niaspan 1 g h.s.  6. Gabapentin 300 mg a day.  7. Torsemide 10 mg a day which will be held the day of procedure.  8.  Aspirin 325 mg a day.  9. Gas-X p.r.n.   SHE IS ALLERGIC TO LATEX BUT HAS NO KNOWN DRUG ALLERGIES.   PAST MEDICAL HISTORY:  Remarkable for:  1. Non-insulin-dependent diabetes.  2. She has treated hypertension.  3. She has treated dyslipidemia.   SOCIAL HISTORY:  She smokes a half pack a day.  She has been disabled  because of problems with her legs.  She used to work an Brewing technologist shift.  She has no children.   FAMILY HISTORY:  Remarkable that both parents died at an early age, her  mother died at 29 of cancer although she is not sure what kind and her  father she is not sure about.  She has 1 brother.   REVIEW OF SYSTEMS:  Remarkable for the fact that Dr. Tresa Endo has seen her  in the past  for sleep apnea and recommended CPAP.   PHYSICAL EXAM:  Blood pressure 160/70.  Pulse 63.  Temp 97.1.  GENERAL:  She is a well-developed, obese female in no acute distress.  HEENT:  Normocephalic.  Extraocular movements were intact.  Sclerae are  nonicteric.  Lids and conjunctivae are within normal limits.  NECK:  Without bruit or JVD.  Thyroid is not enlarged.  CHEST:  Clear to auscultation and percussion.  CARDIAC EXAM:  Reveals regular rate and rhythm without obvious murmur,  rub, or gallop.  Normal S1 and S2.  ABDOMEN:  Nontender.  No hepatosplenomegaly.  She is obese.  EXTREMITIES:  Reveal chronic venous changes bilaterally.  She has a  scaling ulcer on the posterior aspect of her right thigh.  Diminished  pulses bilaterally.  NEURO EXAM:  Grossly intact.  She is awake, alert and oriented, and  cooperative.  Moves all extremities without increased deficit.  SKIN:  Warm and dry.   EKG shows sinus rhythm, sinus bradycardia, nonspecific ST changes with Q-  wave in V2.   IMPRESSION:  1. Recurrent right lower extremity claudication.  2. Known peripheral vascular disease with previous right lower      extremity superficial femoral artery, percutaneous transluminal       angioplasty in June of 2008 with known total left superficial      femoral artery with 2-vessel runoff.  3. Non-insulin-dependent diabetes.  4. Treated dyslipidemia.  5. Obesity.  6. Sleep apnea, on CPAP.  7. Smoking  8. Treated hypertension.   PLAN:  Patient will be admitted for elective PV angiogram.      Abelino Derrick, P.A.      Nanetta Batty, M.D.  Electronically Signed    LKK/MEDQ  D:  06/26/2008  T:  06/26/2008  Job:  [id number]   cc:   [facility], [facility].A.-C.

## 2010-12-17 NOTE — Discharge Summary (Signed)
[name], [name]               ACCOUNT NO.:  [id number]   MEDICAL RECORD NO.:  [id number]          PATIENT TYPE:  OBV   LOCATION:  6525                         FACILITY:  [facility]   PHYSICIAN:  Raymon Mutton, P.A. DATE OF BIRTH:  Dec 10, 1945   DATE OF ADMISSION:  01/25/2007  DATE OF DISCHARGE:                               DISCHARGE SUMMARY   DISCHARGE DIAGNOSES:  1. Peripheral vascular occlusive disease  - Status post PV angiography      with PTI to the right SFA during this admission.  2. History of longstanding lower extremity swelling, venous ulcers and      venous insufficiency with cellulitis of MRSA.  3. Disability secondary to number 2.  4. Hypertension.  5. Morbid obesity with a body mass index of 42.  6. Chronic lower back pain with lower extremity pain.  7. Dyslipidemia.  8. Ongoing tobacco use.  9. Diabetes mellitus type 2.  10.History of heart murmur.  11.History of panic attacks.   This is a 65 year old female with a previous history of venous  insufficiency and chronic lower extremity swelling and cellulitis of the  lower extremities and the back on Disability, who was sent to our office  for evaluation of her lower extremity arterial circulation.  She  underwent ultrasound of the lower extremities, which revealed ankle-  brachial index of 0.54 on the right and 0.43 on the left side.  Based on  this study, Dr. Allyson Sabal evaluated patient in our office and decided to  admit her to the hospital for a PV angio and possible intervention.   Patient was admitted on January 25, 2007, to a short stay unit and  underwent angiography of the lower extremities by Dr. Allyson Sabal.  Cath  revealed 43% stenosis of the distal aorta with 40% of the right common  iliac stenosis and 40% of the 2 focal lesions in the left common iliac  artery.  She also had total left occluded left SFA and 60% stenosis of  the right SFA with 3 vessel run off on the right and 2 vessel run off on  the left.   Dr. Allyson Sabal performed PTI of the right SFA with good result and  lesion was reduced from 60 to 0%.  Patient was held overnight for  observation.  The next morning, she was assessed by Dr. Allyson Sabal.  There  were no problems with her groin, side and she was discharged home in  stable condition.   HOSPITAL LABORATORY:  Hemoglobin was 13.4, hematocrit is 39.9, white  blood cell count are 10.1, platelet count 172.  Sodium 137, potassium  3.9, chloride 103, CO2 27, BUN 12, creatinine 0.90, and glucose 127.  Glycosylated hemoglobin was still pending at the time of discharge.   DISCHARGE MEDS:  1. Aspirin 81 mg daily.  2. Metformin 500 mg daily, resume on Friday.  3. HCTZ 50 mg daily.  4. Clonidine 0.1 mg daily.  5. Singulair 5 mg daily.  6. Zocor 40 mg daily.  7. Altace 2.5 mg daily.  8. Cipro 500 mg daily.  9. Bactrim daily.  10.Resume home  dose of Robaxin t.i.d.   DISCHARGE FOLLOWUP:  Patient will be seen in our office by Dr. Allyson Sabal on  February 17, 2007, at 3:15 p.m., but prior to that, she will have arterial  ultrasound of the lower extremities on February 16, 2007, at 3:30 p.m.      Raymon Mutton, P.A.     MK/MEDQ  D:  01/26/2007  T:  01/26/2007  Job:  [id number]

## 2010-12-17 NOTE — Discharge Summary (Signed)
[name], [name] NO.:  [id number]   MEDICAL RECORD NO.:  [id number]          PATIENT TYPE:  OIB   LOCATION:  2033                         FACILITY:  [facility]   PHYSICIAN:  Nanetta Batty, M.D.   DATE OF BIRTH:  1945/12/29   DATE OF ADMISSION:  09/15/2007  DATE OF DISCHARGE:  09/16/2007                               DISCHARGE SUMMARY   DISCHARGE DIAGNOSES:  1. Peripheral vascular disease.  The patient has had a previous right      superficial femoral artery stent placed June 2008.  Restudy this      admission showed 90% in-stent restenosis on the right with an      unsuccessful attempt at percutaneous transluminal angioplasty.  2. Hypertension with suboptimal control.  3. Noninsulin-dependent diabetes.  4. Morbid obesity.  5. Right lower extremity ulcer, followed at the [facility] wound care      center,   HOSPITAL COURSE:  The patient is a 65 year old female with a history of  hypertension, dyslipidemia, diabetes, smoking and vascular disease.  She  had a nonischemic Myoview June 2008.  She had a right SFA stent placed.  She had improved until a couple months prior to this admission, when she  had recurrent claudication.  She has also developed a right leg ulcer.  She was seen by Dr. Allyson Sabal on September 01, 2007.  ABIs suggested  restenosis.  She was admitted for a peripheral angiogram, which was done  September 15, 2007.  This revealed moderate renal artery disease, total  left SFA with three-vessel runoff and 90% in-stent restenosis on the  right SFA with a 90% distal stenosis to this.  Dr. Allyson Sabal attempted an  intervention but was unsuccessful.  The patient did have hypertension  after her procedure and had to be put on IV nitrates.  She is seen on  rounds September 16, 2007, and is symptomatically improved.  Her blood  pressure is still not well-controlled but was some of her home medicines  had been held pre-procedure and they are being resumed, specifically  her  ARB and diuretic.  She will be sent home later today if her pressure is  stable.   LABS:  White count 10.3, hemoglobin 12.9, hematocrit 38.2, platelets  153.  Sodium 139, potassium 4.0, BUN 6, creatinine 0.77, calcium 8.1.  chest x-ray shows mild cardiomegaly and no acute findings.  EKG shows  sinus rhythm with sinus bradycardia.   DISCHARGE MEDICATIONS:  1. Glucophage 500 mg a day will be resumed on September 18, 2007.  2. Aspirin 81 mg a day.  3. Vytorin 10/10 mg daily  4. Ramipril 10 mg a day.  5. Niaspan 500 mg at bedtime.  6. Teveten HCT 600/12.5 daily.  7. Singulair 10 mg a day.  8. Plavix 75 mg a day.   DISPOSITION:  The patient will be discharged later today if her blood  pressures stable.  We have stopped her IV nitroglycerin.      Abelino Derrick, P.A.      Nanetta Batty, M.D.  Electronically Signed    LKK/MEDQ  D:  09/16/2007  T:  09/17/2007  Job:  [id number]

## 2010-12-20 NOTE — Discharge Summary (Signed)
NAMELORRAINE, [name] NO.:  [id number]   MEDICAL RECORD NO.:  [id number]          PATIENT TYPE:  INP   LOCATION:  4705                         FACILITY:  [facility]   PHYSICIAN:  Nanetta Batty, M.D.   DATE OF BIRTH:  Feb 27, 1946   DATE OF ADMISSION:  06/26/2008  DATE OF DISCHARGE:  06/27/2008                               DISCHARGE SUMMARY   DISCHARGE DIAGNOSES:  1. Peripheral vascular disease, status post right popliteal and right      superficial femoral artery, percutaneous transluminal angioplasty      and stenting this admission.  2. Non-insulin-dependent diabetes.  3. Treated hypertension.  4. Treated dyslipidemia.  5. Sleep apnea.  6. Morbid obesity.  7. History of smoking.   HOSPITAL COURSE:  The patient is a 65 year old female followed by Dr.  Allyson Sabal who has peripheral vascular disease.  She had a nonischemic  Myoview in June 2008 and good LV function by echo.  Lower extremity PV  angiogram in June 2008 revealed an occluded left SFA with two-vessel  runoff and moderate disease in the right.  She underwent intervention to  the right at that time to 2 sites.  Followup Dopplers have been done in  our office.  She saw Dr. Allyson Sabal in October 2009 with increasing  claudication on the right.  She is admitted now for elective PV  angiogram.  This was done on June 26, 2008, by Dr. Allyson Sabal.  She had a  95% right popliteal artery and 95% right SFA that were dilated and  stented.  She tolerated the procedure well.  She was seen in consult by  the smoking cessation team.  We feel she can be discharged on June 27, 2008.   DISCHARGE MEDICATIONS:  1. Her metformin 500 mg daily will be held until June 29, 2008.  2. Lipitor 40 mg a day.  3. Ramipril 10 mg a day.  4. Plavix 75 mg a day.  5. Niaspan 1 g a day.  6. Neurontin 300 mg a day.  7. Torsemide 10 mg a day.  8. Aspirin 325 mg a day.   LABORATORY DATA:  White count 7.6, hemoglobin 13.3, hematocrit  38.8,  platelets 161.  Sodium 136, potassium 4.1, BUN 16, creatinine 0.9.   DISPOSITION:  The patient was discharged in stable condition and follow  up with Dr. Allyson Sabal for outpatient Dopplers.      Abelino Derrick, P.A.      Nanetta Batty, M.D.  Electronically Signed    LKK/MEDQ  D:  07/20/2008  T:  07/21/2008  Job:  [id number]

## 2010-12-20 NOTE — Op Note (Signed)
[name], [name]               ACCOUNT NO.:  [id number]   MEDICAL RECORD NO.:  [id number]          PATIENT TYPE:  AMB   LOCATION:  DAY                          FACILITY:  [facility]   PHYSICIAN:  Anselm Pancoast. Weatherly, M.D.DATE OF BIRTH:  [birth year]   DATE OF PROCEDURE:  01/28/2005  DATE OF DISCHARGE:                                 OPERATIVE REPORT   PREOPERATIVE DIAGNOSIS:  Multiloculated abscess, left breast.   POSTOPERATIVE DIAGNOSIS:  Multiloculated abscess, left breast.   OPERATION:  Debridement and drainage of multiloculated abscess, left breast.   ANESTHESIA:  General.   SURGEON:  Anselm Pancoast. Zachery Dakins, M.D.   [name] is a 65 year old female who presented to the breast center  today with a 10-day history of progressive tenderness, swelling, redness,  and erythema of the left breast.  She was found to have an abscess.  Was  seen by Dr. Maryagnes Amos.  He referred her on over, as surgical drainage and  antibiotics were obviously needed.  The patient has a large mass with an  area of nearly skin breakdown in the approximately 10 o'clock, 11 o'clock  position in the areolar edge.  I took her to the operating suite.  Had  Kefzol available.  After induction of general anesthesia, took an 18 gauge  needle and just aspirated this large abscess with about 10 cc of frank  purulence all being removed.  This was then prepped with Betadine surgical  solution and draped in a sterile manner.  I made a curved incision, removing  this necrotic area of skin and the underlying breast tissue with multiple  loculated abscesses all sharply debrided.  Hemostasis was obtained with  cautery and 3-0 chromic sutures, and this area appears to go down into the  central area.  Hopefully, this is all inflammatory.  I broke and removed all  of the areas of questionable viable tissue, fairly large area of breast  tissue removed, but the skin, with the exception of the ellipse of skin that  I removed,  certainly appears viable.  After washing the wound out thoroughly  with saline, a little Betadine solution, I then checked for hemostasis  again, and then actually partially closed the skin with 3-0 nylon mattress  sutures lining the skin edges and then left the inferior, about a third or a  fourth of the incision opened, and then packed it with one inch of iodoform  gauze with a little saline and Betadine.  The patient was given 1 gm of  Kefzol immediately after the needle aspiration was done, and on reviewing  the gram's stain, all we were seeing was gram positive cocci.  This looks  like a neglected abscess and not really enough skin changes __________ MRSA,  so I am just going to keep her on Kefzol, awaiting the results of the wound.  I have got Betadine solution in the wound.  There was also kind of a big  papilloma of the skin below the breast excised and sent as a second  specimen.  The patient tolerated the procedure well and was extubated and  sent to the recovery room in a stable postoperative condition.  Will watch  her sugar and keep her on her blood pressure medication and keep her at  least 24 hours, awaiting the results of the cultures.  Hopefully, the packing can be removed in about 24 hours and then continue  with warm dressing changes, showering, and basically allow the wound to  contract on without having to go to a second trip to the operating room.  There was a large specimen sent for permanent exam.       WJW/MEDQ  D:  01/28/2005  T:  01/28/2005  Job:  [id number]

## 2010-12-20 NOTE — H&P (Signed)
[name]               ACCOUNT NO.:  [id number]   MEDICAL RECORD NO.:  [id number]          PATIENT TYPE:  AMB   LOCATION:  DAY                          FACILITY:  [facility]   PHYSICIAN:  Anselm Pancoast. Weatherly, M.D.DATE OF BIRTH:  1945/09/05   DATE OF ADMISSION:  01/28/2005  DATE OF DISCHARGE:                                HISTORY & PHYSICAL   CHIEF COMPLAINT:  Infection, left breast.   HISTORY:  [name] is a 65 year old, disabled, Caucasian female who  comes really from the breast center, where she was seen today __________  for a mammogram with the finding of a large abscess of the left breast.  The  patient states she is a diabetic, treated with oral medication.  She gets  her healthcare from the [facility] with Dr. Leanord Hawking and she  is on blood pressure medication.  The patient has had some kind of chronic  ulcers, look like venous stasis ulcers, on her right lower extremity but it  does not look like a MRSA infection, and the patient denies any angina or  cardiac problems.  She does smoke for about 47 years and has a markedly  inflamed left breast with erythema nearly the complete breast area.  There  is an area of necrosis in the immediate medial edge of the acerola area with  probably about a 4-to-5-cm area of infected ischemic area as if this is  going to spontaneously drain.  I discussed with her that we will need to go  to the operating room and drain and debride the area.  The wound will need  to be left open and as far as on the antibiotics, she has not had a previous  problem with MRSA and we will do culture and then start her off on Kefzol  and may switch to a broader antibiotic according to what the Gram stain  shows.  The patient lives with a friend.   PAST SURGERY:  1.  Gallbladder surgery about 30 years ago.  No problems with anesthesia.  2.  GI.  States she goes long term without regular bowel movements.  3.  Has had a carpal tunnel  on the right.   She has been unable to work for approximately a couple of years because of  pedal edema and ulcerations on her right leg if she is up any significant  period of time.   MEDICATIONS:  Glucophage one tablet daily.  She is on a blood pressure pill  that she does not know, and baby aspirin, vitamin E, and I think,  hydrochlorothiazide.   Denies alcohol use, does smoke.   PHYSICAL EXAMINATION:  GENERAL:  The patient is a pleasant, overweight  female with fairly marked erythema of the breast.  VITAL SIGNS:  She is afebrile at 98.1, pulse 68, respirations 18, blood  pressure is 214/71.  She is 5 feet tall and weighs 214 pounds.  EYES/EARS/NOSE/THROAT:  She is normocephalic.  Pupils are equal.  LUNGS:  Good breath sounds.  CARDIAC:  Normal sinus rhythm.  ABDOMEN:  Soft.  BREASTS:  She  has marked erythema of the left breast, nearly the complete  breast, with a large, fluctuant, tender, necrotic area that is nearly  spontaneously draining.  There is a little bit of skin irritation under the  breast from sweating but I do not appreciate any other obvious abscesses.  LOWER EXTREMITIES:  On the lower extremity on the right there is a little  chronic ulceration.  It looks like possibly it could be traumatic whether it  is a venous ulcer.  This has been followed by the wound care.  She said  there used to be more ulcers.  It is not wrapped at this time.  It does not  look like there is fluctuant areas on the lower extremity.   ADMISSION IMPRESSION:  1.  Large left breast abscess, obviously of neglected healthcare.  2.  History of diabetes on oral medication.  3.  History of high blood pressure.   PLAN:  The patient will go to the operating room for surgical debridement  and drainage and packing of the wound and will be in the hospital several  days with cellulitis improving and then delayed contracture of the wound.  I  have discussed with her that this may develop on into a  significant  debridement of the breast being needed because of the delay in seeking  healthcare or surgical management.       WJW/MEDQ  D:  01/28/2005  T:  01/28/2005  Job:  [id number]   cc:   Anselm Pancoast. Zachery Dakins, M.D.  [address]  [place]  [place] [address]   patient's chart

## 2011-02-04 ENCOUNTER — Encounter: Payer: Self-pay | Admitting: Family Medicine

## 2011-02-04 DIAGNOSIS — L89159 Pressure ulcer of sacral region, unspecified stage: Secondary | ICD-10-CM | POA: Insufficient documentation

## 2011-02-04 DIAGNOSIS — G4733 Obstructive sleep apnea (adult) (pediatric): Secondary | ICD-10-CM | POA: Insufficient documentation

## 2011-02-04 DIAGNOSIS — I10 Essential (primary) hypertension: Secondary | ICD-10-CM | POA: Insufficient documentation

## 2011-02-04 DIAGNOSIS — R739 Hyperglycemia, unspecified: Secondary | ICD-10-CM | POA: Insufficient documentation

## 2011-02-04 DIAGNOSIS — R011 Cardiac murmur, unspecified: Secondary | ICD-10-CM | POA: Insufficient documentation

## 2011-02-04 DIAGNOSIS — I2510 Atherosclerotic heart disease of native coronary artery without angina pectoris: Secondary | ICD-10-CM | POA: Insufficient documentation

## 2011-02-04 DIAGNOSIS — I739 Peripheral vascular disease, unspecified: Secondary | ICD-10-CM | POA: Insufficient documentation

## 2011-02-04 DIAGNOSIS — E785 Hyperlipidemia, unspecified: Secondary | ICD-10-CM | POA: Insufficient documentation

## 2011-02-04 DIAGNOSIS — E559 Vitamin D deficiency, unspecified: Secondary | ICD-10-CM | POA: Insufficient documentation

## 2011-04-25 LAB — CBC
Hemoglobin: 12.9
RBC: 3.94
WBC: 10.3

## 2011-04-25 LAB — BASIC METABOLIC PANEL
Calcium: 8.1 — ABNORMAL LOW
Chloride: 105
Creatinine, Ser: 0.77
GFR calc Af Amer: >60
Sodium: 139

## 2011-04-28 LAB — CBC
HCT: 39.4
Hemoglobin: 12.3
Hemoglobin: 13.5
MCHC: 34.3
MCHC: 34.5
MCV: 97.2
RBC: 4.06
RDW: 13.8
RDW: 13.9

## 2011-04-28 LAB — BASIC METABOLIC PANEL
CO2: 28
CO2: 29
Calcium: 8.4
Chloride: 106
Creatinine, Ser: 0.8
GFR calc Af Amer: >60
Glucose, Bld: 128 — ABNORMAL HIGH
Glucose, Bld: 170 — ABNORMAL HIGH
Potassium: 4.2
Sodium: 141

## 2011-04-28 LAB — PROTIME-INR: Prothrombin Time: 13.5

## 2011-05-06 LAB — CBC
HCT: 38.8
MCHC: 34.1
Platelets: 161
RDW: 13.1

## 2011-05-06 LAB — BASIC METABOLIC PANEL
BUN: 16
GFR calc non Af Amer: >60
Glucose, Bld: 169 — ABNORMAL HIGH
Potassium: 4.1

## 2011-05-06 LAB — GLUCOSE, CAPILLARY
Glucose-Capillary: 131 — ABNORMAL HIGH
Glucose-Capillary: 156 — ABNORMAL HIGH

## 2011-05-21 LAB — BASIC METABOLIC PANEL
BUN: 12
Calcium: 8.7
Calcium: 8.7
Creatinine, Ser: 0.9
Creatinine, Ser: 0.92
GFR calc Af Amer: >60
GFR calc non Af Amer: >60
GFR calc non Af Amer: >60
Glucose, Bld: 127 — ABNORMAL HIGH
Sodium: 137
Sodium: 139

## 2011-05-21 LAB — HEMOGLOBIN A1C: Hgb A1c MFr Bld: 7.1 — ABNORMAL HIGH

## 2011-05-21 LAB — CBC
Hemoglobin: 13.4
Platelets: 172
RDW: 13.1

## 2011-05-21 LAB — MAGNESIUM: Magnesium: 2.1

## 2011-08-11 DIAGNOSIS — I6529 Occlusion and stenosis of unspecified carotid artery: Secondary | ICD-10-CM | POA: Diagnosis not present

## 2011-09-04 DIAGNOSIS — I4891 Unspecified atrial fibrillation: Secondary | ICD-10-CM | POA: Diagnosis not present

## 2011-09-04 DIAGNOSIS — I2510 Atherosclerotic heart disease of native coronary artery without angina pectoris: Secondary | ICD-10-CM | POA: Diagnosis not present

## 2011-10-11 DIAGNOSIS — I495 Sick sinus syndrome: Secondary | ICD-10-CM | POA: Diagnosis not present

## 2011-10-11 DIAGNOSIS — I4891 Unspecified atrial fibrillation: Secondary | ICD-10-CM | POA: Diagnosis not present

## 2011-10-13 HISTORY — PX: OTHER SURGICAL HISTORY: SHX169

## 2011-10-27 DIAGNOSIS — H524 Presbyopia: Secondary | ICD-10-CM | POA: Diagnosis not present

## 2011-10-27 DIAGNOSIS — H5210 Myopia, unspecified eye: Secondary | ICD-10-CM | POA: Diagnosis not present

## 2011-10-27 DIAGNOSIS — H52229 Regular astigmatism, unspecified eye: Secondary | ICD-10-CM | POA: Diagnosis not present

## 2011-10-27 DIAGNOSIS — E119 Type 2 diabetes mellitus without complications: Secondary | ICD-10-CM | POA: Diagnosis not present

## 2011-11-13 DIAGNOSIS — I2510 Atherosclerotic heart disease of native coronary artery without angina pectoris: Secondary | ICD-10-CM | POA: Diagnosis not present

## 2011-11-13 DIAGNOSIS — E782 Mixed hyperlipidemia: Secondary | ICD-10-CM | POA: Diagnosis not present

## 2011-11-13 DIAGNOSIS — I6529 Occlusion and stenosis of unspecified carotid artery: Secondary | ICD-10-CM | POA: Diagnosis not present

## 2011-11-13 DIAGNOSIS — I739 Peripheral vascular disease, unspecified: Secondary | ICD-10-CM | POA: Diagnosis not present

## 2011-11-15 DIAGNOSIS — I495 Sick sinus syndrome: Secondary | ICD-10-CM | POA: Diagnosis not present

## 2011-11-15 DIAGNOSIS — I4891 Unspecified atrial fibrillation: Secondary | ICD-10-CM | POA: Diagnosis not present

## 2011-11-20 DIAGNOSIS — G4733 Obstructive sleep apnea (adult) (pediatric): Secondary | ICD-10-CM | POA: Diagnosis not present

## 2011-11-20 DIAGNOSIS — G4730 Sleep apnea, unspecified: Secondary | ICD-10-CM | POA: Diagnosis not present

## 2011-12-09 DIAGNOSIS — L02219 Cutaneous abscess of trunk, unspecified: Secondary | ICD-10-CM | POA: Diagnosis not present

## 2011-12-13 DIAGNOSIS — I495 Sick sinus syndrome: Secondary | ICD-10-CM | POA: Diagnosis not present

## 2011-12-13 DIAGNOSIS — I4891 Unspecified atrial fibrillation: Secondary | ICD-10-CM | POA: Diagnosis not present

## 2011-12-16 DIAGNOSIS — E119 Type 2 diabetes mellitus without complications: Secondary | ICD-10-CM | POA: Diagnosis not present

## 2011-12-16 DIAGNOSIS — L723 Sebaceous cyst: Secondary | ICD-10-CM | POA: Diagnosis not present

## 2012-01-15 DIAGNOSIS — I4891 Unspecified atrial fibrillation: Secondary | ICD-10-CM | POA: Diagnosis not present

## 2012-01-15 DIAGNOSIS — I495 Sick sinus syndrome: Secondary | ICD-10-CM | POA: Diagnosis not present

## 2012-01-26 DIAGNOSIS — I70219 Atherosclerosis of native arteries of extremities with intermittent claudication, unspecified extremity: Secondary | ICD-10-CM | POA: Diagnosis not present

## 2012-02-09 DIAGNOSIS — E782 Mixed hyperlipidemia: Secondary | ICD-10-CM | POA: Diagnosis not present

## 2012-02-09 DIAGNOSIS — I4891 Unspecified atrial fibrillation: Secondary | ICD-10-CM | POA: Diagnosis not present

## 2012-02-09 DIAGNOSIS — I739 Peripheral vascular disease, unspecified: Secondary | ICD-10-CM | POA: Diagnosis not present

## 2012-02-16 DIAGNOSIS — I495 Sick sinus syndrome: Secondary | ICD-10-CM | POA: Diagnosis not present

## 2012-02-16 DIAGNOSIS — I4891 Unspecified atrial fibrillation: Secondary | ICD-10-CM | POA: Diagnosis not present

## 2012-02-23 DIAGNOSIS — E109 Type 1 diabetes mellitus without complications: Secondary | ICD-10-CM | POA: Diagnosis not present

## 2012-02-23 DIAGNOSIS — Z79899 Other long term (current) drug therapy: Secondary | ICD-10-CM | POA: Diagnosis not present

## 2012-02-23 DIAGNOSIS — E782 Mixed hyperlipidemia: Secondary | ICD-10-CM | POA: Diagnosis not present

## 2012-02-23 DIAGNOSIS — N183 Chronic kidney disease, stage 3 unspecified: Secondary | ICD-10-CM | POA: Diagnosis not present

## 2012-02-23 DIAGNOSIS — R5381 Other malaise: Secondary | ICD-10-CM | POA: Diagnosis not present

## 2012-02-23 DIAGNOSIS — E559 Vitamin D deficiency, unspecified: Secondary | ICD-10-CM | POA: Diagnosis not present

## 2012-03-02 DIAGNOSIS — I129 Hypertensive chronic kidney disease with stage 1 through stage 4 chronic kidney disease, or unspecified chronic kidney disease: Secondary | ICD-10-CM | POA: Diagnosis not present

## 2012-03-02 DIAGNOSIS — E870 Hyperosmolality and hypernatremia: Secondary | ICD-10-CM | POA: Diagnosis not present

## 2012-03-02 DIAGNOSIS — N2581 Secondary hyperparathyroidism of renal origin: Secondary | ICD-10-CM | POA: Diagnosis not present

## 2012-03-04 DIAGNOSIS — E119 Type 2 diabetes mellitus without complications: Secondary | ICD-10-CM | POA: Diagnosis not present

## 2012-03-04 DIAGNOSIS — N189 Chronic kidney disease, unspecified: Secondary | ICD-10-CM | POA: Diagnosis not present

## 2012-03-09 DIAGNOSIS — G4733 Obstructive sleep apnea (adult) (pediatric): Secondary | ICD-10-CM | POA: Diagnosis not present

## 2012-03-09 DIAGNOSIS — I119 Hypertensive heart disease without heart failure: Secondary | ICD-10-CM | POA: Diagnosis not present

## 2012-03-09 DIAGNOSIS — I2510 Atherosclerotic heart disease of native coronary artery without angina pectoris: Secondary | ICD-10-CM | POA: Diagnosis not present

## 2012-03-19 DIAGNOSIS — I4891 Unspecified atrial fibrillation: Secondary | ICD-10-CM | POA: Diagnosis not present

## 2012-03-19 DIAGNOSIS — I495 Sick sinus syndrome: Secondary | ICD-10-CM | POA: Diagnosis not present

## 2012-04-06 ENCOUNTER — Encounter: Payer: Self-pay | Admitting: *Deleted

## 2012-04-06 ENCOUNTER — Encounter: Payer: Medicare Other | Attending: Family Medicine | Admitting: *Deleted

## 2012-04-06 DIAGNOSIS — E119 Type 2 diabetes mellitus without complications: Secondary | ICD-10-CM | POA: Insufficient documentation

## 2012-04-06 DIAGNOSIS — E785 Hyperlipidemia, unspecified: Secondary | ICD-10-CM

## 2012-04-06 DIAGNOSIS — I10 Essential (primary) hypertension: Secondary | ICD-10-CM | POA: Insufficient documentation

## 2012-04-06 DIAGNOSIS — Z713 Dietary counseling and surveillance: Secondary | ICD-10-CM | POA: Insufficient documentation

## 2012-04-06 DIAGNOSIS — I2510 Atherosclerotic heart disease of native coronary artery without angina pectoris: Secondary | ICD-10-CM | POA: Insufficient documentation

## 2012-04-06 NOTE — Patient Instructions (Addendum)
Plan: Aim for 2-3 Carb Choices (30-45 grams) per meal Aim for 0-1 Carbs per snack if hungry Continue checking BG as directed by MD and consider checking after meals or increase of activity to assess BG responses Consider asking MD about safe levels of activity for you and what types are appropriate such as Arm Chair exercises Consider reading food labels for total carbohydrate of foods

## 2012-04-06 NOTE — Progress Notes (Signed)
  Medical Nutrition Therapy:  Appt start time: 0900 end time:  1030.  Assessment:  Primary concerns today: patient here for diabetes education and assistance with weight management. She is accompanied by her friend Judie Grieve who lives with her and assists her with medical appointments. They state they have been friends for 22 years. 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 She tests her BG each AM, has not been taught carb counting yet and activity level is limited due to vertigo and risk of falling. She tries to walk around the house and takes trash out to the curb weekly.  MEDICATIONS: see list. Diabetes mediation is Lantus @ 60 units twice a day   DIETARY INTAKE:  Usual eating pattern includes 3 meals and 0-1 snacks per day.  Everyday foods include fair variety of all food groups.  Avoided foods include fried foods and sweets.    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   Nutritional Diagnosis:  NB-1.1 Food and nutrition-related knowledge deficit As related to diabetes management.  As evidenced by A1c of 9.1% in May, 2013.    Intervention:  Nutrition counseling and diabetes education initiated. Discussed basic physiology of diabetes, SMBG and rationale of checking BG at alternate times of day, A1c, Carb Counting and reading food labels, and benefits of increased activity. Suggested she check with her MD as to types of activity  that would be safe for her and suggested Arm Chair exercise options due to her vertigo.  Plan: Aim for 2-3 Carb Choices (30-45 grams) per meal Aim for 0-1 Carbs per snack if hungry Continue checking BG as directed by MD and consider checking after meals or increase of activity to assess BG responses Consider asking MD about safe levels of activity for you and what types are appropriate such as Arm Chair exercises Consider reading food labels for total carbohydrate of foods  Handouts given during visit include: Living Well with Diabetes Carb Counting and Food Label handouts Meal Plan Card  Menu Planner Practice Sheet  Monitoring/Evaluation:  Dietary intake, exercise, reading food labels, and body weight in 2 month(s).

## 2012-04-15 DIAGNOSIS — E119 Type 2 diabetes mellitus without complications: Secondary | ICD-10-CM | POA: Diagnosis not present

## 2012-04-15 DIAGNOSIS — I10 Essential (primary) hypertension: Secondary | ICD-10-CM | POA: Diagnosis not present

## 2012-04-15 DIAGNOSIS — Z23 Encounter for immunization: Secondary | ICD-10-CM | POA: Diagnosis not present

## 2012-04-17 DIAGNOSIS — I4891 Unspecified atrial fibrillation: Secondary | ICD-10-CM | POA: Diagnosis not present

## 2012-04-17 DIAGNOSIS — I495 Sick sinus syndrome: Secondary | ICD-10-CM | POA: Diagnosis not present

## 2012-05-20 DIAGNOSIS — I495 Sick sinus syndrome: Secondary | ICD-10-CM | POA: Diagnosis not present

## 2012-05-20 DIAGNOSIS — I4891 Unspecified atrial fibrillation: Secondary | ICD-10-CM | POA: Diagnosis not present

## 2012-06-01 DIAGNOSIS — R7989 Other specified abnormal findings of blood chemistry: Secondary | ICD-10-CM | POA: Diagnosis not present

## 2012-06-01 DIAGNOSIS — E119 Type 2 diabetes mellitus without complications: Secondary | ICD-10-CM | POA: Diagnosis not present

## 2012-06-01 DIAGNOSIS — I10 Essential (primary) hypertension: Secondary | ICD-10-CM | POA: Diagnosis not present

## 2012-06-01 DIAGNOSIS — E785 Hyperlipidemia, unspecified: Secondary | ICD-10-CM | POA: Diagnosis not present

## 2012-06-01 DIAGNOSIS — E559 Vitamin D deficiency, unspecified: Secondary | ICD-10-CM | POA: Diagnosis not present

## 2012-06-11 DIAGNOSIS — R011 Cardiac murmur, unspecified: Secondary | ICD-10-CM | POA: Diagnosis not present

## 2012-06-11 DIAGNOSIS — I10 Essential (primary) hypertension: Secondary | ICD-10-CM | POA: Diagnosis not present

## 2012-06-24 DIAGNOSIS — I495 Sick sinus syndrome: Secondary | ICD-10-CM | POA: Diagnosis not present

## 2012-06-24 DIAGNOSIS — I4891 Unspecified atrial fibrillation: Secondary | ICD-10-CM | POA: Diagnosis not present

## 2012-07-06 ENCOUNTER — Other Ambulatory Visit (HOSPITAL_COMMUNITY): Payer: Self-pay | Admitting: Family Medicine

## 2012-07-06 DIAGNOSIS — I2510 Atherosclerotic heart disease of native coronary artery without angina pectoris: Secondary | ICD-10-CM

## 2012-07-06 DIAGNOSIS — R011 Cardiac murmur, unspecified: Secondary | ICD-10-CM

## 2012-07-14 ENCOUNTER — Ambulatory Visit (HOSPITAL_COMMUNITY)
Admission: RE | Admit: 2012-07-14 | Discharge: 2012-07-14 | Disposition: A | Discharge status: Home / Self Care | Payer: Medicare Other | Source: Ambulatory Visit | Attending: Cardiovascular Disease | Admitting: Cardiovascular Disease

## 2012-07-14 DIAGNOSIS — I2510 Atherosclerotic heart disease of native coronary artery without angina pectoris: Secondary | ICD-10-CM | POA: Insufficient documentation

## 2012-07-14 DIAGNOSIS — I359 Nonrheumatic aortic valve disorder, unspecified: Secondary | ICD-10-CM | POA: Insufficient documentation

## 2012-07-14 DIAGNOSIS — R0609 Other forms of dyspnea: Secondary | ICD-10-CM | POA: Insufficient documentation

## 2012-07-14 DIAGNOSIS — I10 Essential (primary) hypertension: Secondary | ICD-10-CM | POA: Insufficient documentation

## 2012-07-14 DIAGNOSIS — R011 Cardiac murmur, unspecified: Secondary | ICD-10-CM | POA: Insufficient documentation

## 2012-07-14 DIAGNOSIS — R0989 Other specified symptoms and signs involving the circulatory and respiratory systems: Secondary | ICD-10-CM | POA: Insufficient documentation

## 2012-07-14 NOTE — Progress Notes (Signed)
2D Echo Performed 07/14/2012    Analyssa Downs, RCS  

## 2012-07-19 DIAGNOSIS — I495 Sick sinus syndrome: Secondary | ICD-10-CM | POA: Diagnosis not present

## 2012-07-19 DIAGNOSIS — I4891 Unspecified atrial fibrillation: Secondary | ICD-10-CM | POA: Diagnosis not present

## 2012-08-16 DIAGNOSIS — I2510 Atherosclerotic heart disease of native coronary artery without angina pectoris: Secondary | ICD-10-CM | POA: Diagnosis not present

## 2012-08-16 DIAGNOSIS — R079 Chest pain, unspecified: Secondary | ICD-10-CM | POA: Diagnosis not present

## 2012-08-16 DIAGNOSIS — E782 Mixed hyperlipidemia: Secondary | ICD-10-CM | POA: Diagnosis not present

## 2012-08-16 DIAGNOSIS — N2581 Secondary hyperparathyroidism of renal origin: Secondary | ICD-10-CM | POA: Diagnosis not present

## 2012-08-16 DIAGNOSIS — R0602 Shortness of breath: Secondary | ICD-10-CM | POA: Diagnosis not present

## 2012-08-16 DIAGNOSIS — N183 Chronic kidney disease, stage 3 unspecified: Secondary | ICD-10-CM | POA: Diagnosis not present

## 2012-08-24 DIAGNOSIS — N2581 Secondary hyperparathyroidism of renal origin: Secondary | ICD-10-CM | POA: Diagnosis not present

## 2012-08-24 DIAGNOSIS — I129 Hypertensive chronic kidney disease with stage 1 through stage 4 chronic kidney disease, or unspecified chronic kidney disease: Secondary | ICD-10-CM | POA: Diagnosis not present

## 2012-08-24 DIAGNOSIS — D649 Anemia, unspecified: Secondary | ICD-10-CM | POA: Diagnosis not present

## 2012-09-21 DIAGNOSIS — I4891 Unspecified atrial fibrillation: Secondary | ICD-10-CM | POA: Diagnosis not present

## 2012-09-21 DIAGNOSIS — I495 Sick sinus syndrome: Secondary | ICD-10-CM | POA: Diagnosis not present

## 2012-10-07 ENCOUNTER — Other Ambulatory Visit (HOSPITAL_COMMUNITY): Payer: Self-pay | Admitting: Cardiovascular Disease

## 2012-10-07 DIAGNOSIS — I739 Peripheral vascular disease, unspecified: Secondary | ICD-10-CM

## 2012-10-12 ENCOUNTER — Ambulatory Visit (HOSPITAL_COMMUNITY)
Admission: RE | Admit: 2012-10-12 | Discharge: 2012-10-12 | Disposition: A | Discharge status: Home / Self Care | Payer: Medicare Other | Source: Ambulatory Visit | Attending: Cardiovascular Disease | Admitting: Cardiovascular Disease

## 2012-10-12 DIAGNOSIS — I739 Peripheral vascular disease, unspecified: Secondary | ICD-10-CM | POA: Diagnosis not present

## 2012-10-12 DIAGNOSIS — I6529 Occlusion and stenosis of unspecified carotid artery: Secondary | ICD-10-CM | POA: Diagnosis not present

## 2012-10-12 NOTE — Progress Notes (Signed)
Carotid Duplex Complete [name] 

## 2012-11-01 DIAGNOSIS — I4891 Unspecified atrial fibrillation: Secondary | ICD-10-CM | POA: Diagnosis not present

## 2012-11-01 DIAGNOSIS — I495 Sick sinus syndrome: Secondary | ICD-10-CM | POA: Diagnosis not present

## 2012-11-08 DIAGNOSIS — I2510 Atherosclerotic heart disease of native coronary artery without angina pectoris: Secondary | ICD-10-CM | POA: Diagnosis not present

## 2012-11-08 DIAGNOSIS — I10 Essential (primary) hypertension: Secondary | ICD-10-CM | POA: Diagnosis not present

## 2012-11-08 DIAGNOSIS — I4891 Unspecified atrial fibrillation: Secondary | ICD-10-CM | POA: Diagnosis not present

## 2012-11-08 DIAGNOSIS — I739 Peripheral vascular disease, unspecified: Secondary | ICD-10-CM | POA: Diagnosis not present

## 2012-12-26 ENCOUNTER — Other Ambulatory Visit (HOSPITAL_COMMUNITY): Payer: Self-pay | Admitting: Cardiovascular Disease

## 2013-01-18 ENCOUNTER — Telehealth: Payer: Self-pay | Admitting: Cardiovascular Disease

## 2013-01-18 NOTE — Telephone Encounter (Signed)
Pt's caregiver called and stated that she has been summonsed for jury duty. She needs a letter stating that she can not participate in jury duty. She needs it to state why she can not participate in jury duty. She has an appointment on July 10th @ 1:30. He just wanted to give a heads up on what he needs.

## 2013-01-18 NOTE — Telephone Encounter (Signed)
Message forwarded to K. Vogel, RN.  

## 2013-01-22 ENCOUNTER — Other Ambulatory Visit: Payer: Self-pay | Admitting: Cardiovascular Disease

## 2013-01-22 DIAGNOSIS — R55 Syncope and collapse: Secondary | ICD-10-CM | POA: Diagnosis not present

## 2013-01-22 LAB — PACEMAKER DEVICE OBSERVATION

## 2013-01-25 ENCOUNTER — Other Ambulatory Visit: Payer: Self-pay | Admitting: Family Medicine

## 2013-01-25 DIAGNOSIS — D649 Anemia, unspecified: Secondary | ICD-10-CM

## 2013-01-25 DIAGNOSIS — N2581 Secondary hyperparathyroidism of renal origin: Secondary | ICD-10-CM

## 2013-01-25 NOTE — Telephone Encounter (Signed)
Will discuss at office visit.

## 2013-01-27 ENCOUNTER — Encounter: Payer: Self-pay | Admitting: *Deleted

## 2013-01-27 LAB — REMOTE PACEMAKER DEVICE

## 2013-02-10 ENCOUNTER — Ambulatory Visit (INDEPENDENT_AMBULATORY_CARE_PROVIDER_SITE_OTHER): Payer: Medicare Other | Admitting: Cardiovascular Disease

## 2013-02-10 ENCOUNTER — Encounter: Payer: Self-pay | Admitting: Cardiovascular Disease

## 2013-02-10 VITALS — BP 118/68 | HR 84 | Ht 60.5 in | Wt 233.2 lb

## 2013-02-10 DIAGNOSIS — I471 Supraventricular tachycardia: Secondary | ICD-10-CM | POA: Insufficient documentation

## 2013-02-10 DIAGNOSIS — Z79899 Other long term (current) drug therapy: Secondary | ICD-10-CM

## 2013-02-10 DIAGNOSIS — I739 Peripheral vascular disease, unspecified: Secondary | ICD-10-CM | POA: Diagnosis not present

## 2013-02-10 DIAGNOSIS — E785 Hyperlipidemia, unspecified: Secondary | ICD-10-CM

## 2013-02-10 DIAGNOSIS — I10 Essential (primary) hypertension: Secondary | ICD-10-CM | POA: Diagnosis not present

## 2013-02-10 DIAGNOSIS — I4891 Unspecified atrial fibrillation: Secondary | ICD-10-CM

## 2013-02-10 DIAGNOSIS — I480 Paroxysmal atrial fibrillation: Secondary | ICD-10-CM

## 2013-02-10 DIAGNOSIS — I2510 Atherosclerotic heart disease of native coronary artery without angina pectoris: Secondary | ICD-10-CM | POA: Diagnosis not present

## 2013-02-10 NOTE — Assessment & Plan Note (Signed)
Status post coronary artery bypass grafting x5 by Dr. Elbert Ewings 05/22/10.she had an LIMA graft to the LAD, sequential vein to the ramus branch and circumflex and a crit being to the PDA and PLA as well. She had a Myoview stress test performed 08/13/10 that showed inferolateral scar with without ischemia. She denies chest pain or shortness of breath.

## 2013-02-10 NOTE — Patient Instructions (Addendum)
Your physician recommends that you schedule a follow-up appointment in: [place] in 6 month   Your physician recommends that you schedule a follow-up appointment in: 1 year  Your physician recommends that you return for lab work CMP, LIPIDS

## 2013-02-10 NOTE — Assessment & Plan Note (Signed)
Her left peripheral vessel and ramus performed 05/18/09 revealing occluded SFAs bilaterally. Her left lower Jimi Dopplers performed one year ago showed ABIs in the 0.3 range bilaterally. She does have known carotid disease with recent carotid Dopplers that showed mild right and moderate left internal carotid artery stenosis unchanged from prior study. She is neurologically asymptomatic.

## 2013-02-10 NOTE — Assessment & Plan Note (Signed)
She has a loop recorder implanted which was recently interrogated and showed no evidence of PAF

## 2013-02-10 NOTE — Progress Notes (Signed)
02/10/2013 [name]   1945-09-25  [id number]  Primary Physician Leo Grosser, MD Primary Cardiologist: Runell Gess MD [name]   HPI:  The patient is a 67 year old moderately overweight single Caucasian female with no children whom I last saw 6 months ago. She saw Dr. Tresa Endo recently for followup of sleep apnea and CPAP. She has a history of CAD, status post bypass grafting x5 on May 22, 2010, by Dr. Ofilia Neas with a LIMA to her LAD, sequential vein to a ramus branch, circumflex, and the PDA and posterolateral branch as well as to the right coronary artery. Her postoperative course was complicated by paroxysmal AFib. A loop recorder implanted by Dr. Lynnea Ferrier continues to be interrogated without episodes. She does have PVOD with occluded SFAs bilaterally and 50% left renal artery stenosis as well as moderate left common femoral disease. She does have lifestyle-limiting claudication but this is not _____. She has chronic renal insufficiency followed by Dr. Allena Katz as well as hypertension, hyperlipidemia, and remote tobacco abuse, having quit 2 years ago. She also has mild right and moderate left ICA stenosis and neurologically asymptomatic. She has had 2 episodes of chest pain the past 2 mornings as well as dyspnea. She had a Myoview performed August 13, 2010, after her bypass surgery that showed inferolateral scar toward the base without ischemia.   I saw her 6 months ago. Since that time she denies chest pain or shortness of breath. Recent carotid Doppler showed no change from her prior study with mild right and moderate left ICA stenosis.     Current Outpatient Prescriptions  Medication Sig Dispense Refill  . acetaminophen (TYLENOL) 325 MG tablet Take 650 mg by mouth every 6 (six) hours as needed.      [name] amLODipine (NORVASC) 10 MG tablet Take 10 mg by mouth daily.        [name] aspirin 81 MG tablet Take 81 mg by mouth 2 (two) times daily.        . clopidogrel  (PLAVIX) 75 MG tablet Take 75 mg by mouth daily.        . CRESTOR 10 MG tablet TAKE 1 TABLET EVERY DAY  30 tablet  10  . ergocalciferol (VITAMIN D2) 50000 UNITS capsule Take 50,000 Units by mouth once a week.        . esomeprazole (NEXIUM) 40 MG capsule Take 40 mg by mouth daily before breakfast.      . furosemide (LASIX) 40 MG tablet Take 40 mg by mouth daily.      . hydrALAZINE (APRESOLINE) 25 MG tablet Take 25 mg by mouth 2 (two) times daily.        . insulin glargine (LANTUS) 100 UNIT/ML injection Inject 70 Units into the skin 2 (two) times daily.       [name] lisinopril (PRINIVIL,ZESTRIL) 10 MG tablet Take 10 mg by mouth daily.      . metoCLOPramide (REGLAN) 5 MG tablet Take 5 mg by mouth 3 (three) times daily.        . niacin (NIASPAN) 750 MG CR tablet Take 750 mg by mouth at bedtime.        . nitroGLYCERIN (NITROSTAT) 0.4 MG SL tablet Place 0.4 mg under the tongue every 5 (five) minutes as needed.       No current facility-administered medications for this visit.    Allergies  Allergen Reactions  . Latex   . Neosporin (Neomycin-Polymyxin-Gramicidin)   . Sulfa Antibiotics Nausea And Vomiting  History   Social History  . Marital Status: Single    Spouse Name: N/A    Number of Children: N/A  . Years of Education: N/A   Occupational History  . Not on file.   Social History Main Topics  . Smoking status: Former Smoker    Quit date: 01/22/2010  . Smokeless tobacco: Never Used  . Alcohol Use: No  . Drug Use: No  . Sexually Active: Not on file   Other Topics Concern  . Not on file   Social History Narrative  . No narrative on file     Review of Systems: General: negative for chills, fever, night sweats or weight changes.  Cardiovascular: negative for chest pain, dyspnea on exertion, edema, orthopnea, palpitations, paroxysmal nocturnal dyspnea or shortness of breath Dermatological: negative for rash Respiratory: negative for cough or wheezing Urologic: negative for  hematuria Abdominal: negative for nausea, vomiting, diarrhea, bright red blood per rectum, melena, or hematemesis Neurologic: negative for visual changes, syncope, or dizziness All other systems reviewed and are otherwise negative except as noted above.    Blood pressure 118/68, pulse 84, height 5' 0.5" (1.537 m), weight 233 lb 3.2 oz (105.779 kg).  General appearance: alert and no distress Neck: no adenopathy, no carotid bruit, no JVD, supple, symmetrical, trachea midline and thyroid not enlarged, symmetric, no tenderness/mass/nodules Lungs: clear to auscultation bilaterally Heart: regular rate and rhythm, S1, S2 normal, no murmur, click, rub or gallop Extremities: extremities normal, atraumatic, no cyanosis or edema  EKG normal sinus rhythm at 84 without ST or T wave changes  ASSESSMENT AND PLAN:   CAD (coronary artery disease) Status post coronary artery bypass grafting x5 by Dr. Elbert Ewings 05/22/10.she had an LIMA graft to the LAD, sequential vein to the ramus branch and circumflex and a crit being to the PDA and PLA as well. She had a Myoview stress test performed 08/13/10 that showed inferolateral scar with without ischemia. She denies chest pain or shortness of breath.  Peripheral artery disease Her left peripheral vessel and ramus performed 05/18/09 revealing occluded SFAs bilaterally. Her left lower Jimi Dopplers performed one year ago showed ABIs in the 0.3 range bilaterally. She does have known carotid disease with recent carotid Dopplers that showed mild right and moderate left internal carotid artery stenosis unchanged from prior study. She is neurologically asymptomatic.  Hyperlipidemia On statin therapy. We will recheck a lipid and liver profile.  Paroxysmal atrial fibrillation She has a loop recorder implanted which was recently interrogated and showed no evidence of PAF      Runell Gess MD [facility], [facility] 02/10/2013 2:16 PM

## 2013-02-10 NOTE — Assessment & Plan Note (Signed)
On statin therapy. We will recheck a lipid and liver profile 

## 2013-02-13 ENCOUNTER — Other Ambulatory Visit: Payer: Self-pay | Admitting: Cardiovascular Disease

## 2013-02-14 NOTE — Telephone Encounter (Signed)
Rx was sent to pharmacy electronically. 

## 2013-02-15 ENCOUNTER — Encounter: Payer: Self-pay | Admitting: Cardiovascular Disease

## 2013-02-22 ENCOUNTER — Other Ambulatory Visit: Payer: Medicare Other

## 2013-02-22 DIAGNOSIS — D649 Anemia, unspecified: Secondary | ICD-10-CM

## 2013-02-22 DIAGNOSIS — E109 Type 1 diabetes mellitus without complications: Secondary | ICD-10-CM

## 2013-02-22 DIAGNOSIS — N2581 Secondary hyperparathyroidism of renal origin: Secondary | ICD-10-CM

## 2013-02-22 DIAGNOSIS — E785 Hyperlipidemia, unspecified: Secondary | ICD-10-CM | POA: Diagnosis not present

## 2013-02-22 DIAGNOSIS — Z79899 Other long term (current) drug therapy: Secondary | ICD-10-CM | POA: Diagnosis not present

## 2013-02-22 LAB — LIPID PANEL
HDL: 32 mg/dL — ABNORMAL LOW (ref >39)
LDL Cholesterol: 45 mg/dL (ref 0–99)
Total CHOL/HDL Ratio: 3.4 Ratio
Triglycerides: 154 mg/dL — ABNORMAL HIGH (ref <150)

## 2013-02-22 LAB — COMPREHENSIVE METABOLIC PANEL
AST: 13 U/L (ref 0–37)
Alkaline Phosphatase: 72 U/L (ref 39–117)
BUN: 24 mg/dL — ABNORMAL HIGH (ref 6–23)
Creat: 1.24 mg/dL — ABNORMAL HIGH (ref 0.50–1.10)
Glucose, Bld: 99 mg/dL (ref 70–99)

## 2013-02-22 LAB — RENAL FUNCTION PANEL
Albumin: 3.9 g/dL (ref 3.5–5.2)
BUN: 24 mg/dL — ABNORMAL HIGH (ref 6–23)
Calcium: 9.5 mg/dL (ref 8.4–10.5)
Glucose, Bld: 100 mg/dL — ABNORMAL HIGH (ref 70–99)

## 2013-02-22 LAB — HEMOGLOBIN A1C
Hgb A1c MFr Bld: 5.8 % — ABNORMAL HIGH (ref <5.7)
Mean Plasma Glucose: 120 mg/dL — ABNORMAL HIGH (ref <117)

## 2013-02-22 LAB — URINALYSIS, ROUTINE W REFLEX MICROSCOPIC
Bilirubin Urine: NEGATIVE
Glucose, UA: NEGATIVE mg/dL
Specific Gravity, Urine: 1.019 (ref 1.005–1.030)
Urobilinogen, UA: 0.2 mg/dL (ref 0.0–1.0)

## 2013-02-22 LAB — URINALYSIS, MICROSCOPIC ONLY: Casts: NONE SEEN

## 2013-02-22 LAB — MAGNESIUM: Magnesium: 2.2 mg/dL (ref 1.5–2.5)

## 2013-02-23 ENCOUNTER — Encounter: Payer: Self-pay | Admitting: *Deleted

## 2013-02-23 ENCOUNTER — Ambulatory Visit (INDEPENDENT_AMBULATORY_CARE_PROVIDER_SITE_OTHER): Payer: Medicare Other | Admitting: Cardiovascular Disease

## 2013-02-23 ENCOUNTER — Encounter: Payer: Self-pay | Admitting: Cardiovascular Disease

## 2013-02-23 VITALS — BP 140/60 | HR 82 | Ht 60.5 in | Wt 229.5 lb

## 2013-02-23 DIAGNOSIS — I10 Essential (primary) hypertension: Secondary | ICD-10-CM

## 2013-02-23 DIAGNOSIS — I739 Peripheral vascular disease, unspecified: Secondary | ICD-10-CM | POA: Diagnosis not present

## 2013-02-23 DIAGNOSIS — G4733 Obstructive sleep apnea (adult) (pediatric): Secondary | ICD-10-CM | POA: Diagnosis not present

## 2013-02-23 DIAGNOSIS — I4891 Unspecified atrial fibrillation: Secondary | ICD-10-CM | POA: Diagnosis not present

## 2013-02-23 DIAGNOSIS — I480 Paroxysmal atrial fibrillation: Secondary | ICD-10-CM

## 2013-02-23 DIAGNOSIS — E785 Hyperlipidemia, unspecified: Secondary | ICD-10-CM

## 2013-02-23 DIAGNOSIS — I2510 Atherosclerotic heart disease of native coronary artery without angina pectoris: Secondary | ICD-10-CM | POA: Diagnosis not present

## 2013-02-23 DIAGNOSIS — Z4509 Encounter for adjustment and management of other cardiac device: Secondary | ICD-10-CM

## 2013-02-23 LAB — PACEMAKER DEVICE OBSERVATION

## 2013-02-23 NOTE — Progress Notes (Signed)
In office loop recorder interrogation. Normal device function. No changes made this session.

## 2013-02-23 NOTE — Patient Instructions (Addendum)
Please send a remote loop recorder transmission on 03-27-2013, and follow up with Dr.Croitoru in 15 months.

## 2013-02-27 ENCOUNTER — Other Ambulatory Visit: Payer: Self-pay | Admitting: Family Medicine

## 2013-02-27 NOTE — Assessment & Plan Note (Signed)
She is known to have bilateral superficial femoral artery occlusions in addition she has a high-grade distal left external iliac artery stenosis. On both sides the popliteal arteries reconstitute via collaterals and there is two-vessel runoff with absence of the peroneal arteries. The ABIs are only 0.35 on the right side and 0.3 to the left side. Remarkably she has very few symptoms related to her legs. She has less than 50% plaque in both carotid arteries and has a 50% left renal artery stenosis.

## 2013-02-27 NOTE — Assessment & Plan Note (Signed)
Control is fair. 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 Her most recent creatinine is 1.4

## 2013-02-27 NOTE — Assessment & Plan Note (Signed)
No symptoms of angina. Status post CABG x5, Dr. Tyrone Sage, October 2011, LIMA to LAD, SVG sequential to intermediate and circumflex, SVG sequential to PDA and PLA

## 2013-02-27 NOTE — Assessment & Plan Note (Signed)
Excellent control. In fact hemoglobin A1c maybe a little low

## 2013-02-27 NOTE — Assessment & Plan Note (Addendum)
Her loop recorder shows infrequent and brief episodes categorized as atrial fibrillation, but on review they appear to be brief bursts of supraventricular tachycardia and longer episodes of frequent premature atrial contractions, often in bigeminy. One episode that lasted almost an hour in May actually represents a sinus rhythm with frequent PACs often in a pattern of bigeminy. In the last month since her last loop recorder download she has only had episodes of 4 minutes or less in duration. At this point in time I think she is best served to remain on aspirin and Plavix for the severity of her arterial problems, rather than starting warfarin.

## 2013-02-27 NOTE — Assessment & Plan Note (Signed)
Mediocre compliance with CPAP

## 2013-02-27 NOTE — Progress Notes (Signed)
Patient ID: [name], female   DOB: 1946-08-03, 67 y.o.   MRN: [id number]     Reason for office visit Atrial fibrillation followup, interrogation of loop recorder  [name] has numerous cardiovascular problems including a history of coronary disease status post bypass surgery in 2011, severe lower showed a peripheral arterial disease with bilateral femoral artery occlusions, diabetes mellitus, hyperlipidemia, hypertension, obstructive sleep apnea and chronic kidney disease.  Interrogation of her loop recorder has shown recurrent episodes of brief atrial tachycardia and long periods of PACs and patterns of bigeminy or trigeminy. There is no convincing evidence of atrial fibrillation.  She leads a very sedentary lifestyle but generally feels well. She is limited by severe lower extremity intermittent claudication, but the pattern of this symptom has not recently changed. She has not had any further chest pain since seeing [name] in our office in April. She denies shortness of breath.    Allergies  Allergen Reactions  . Latex   . Neosporin (Neomycin-Polymyxin-Gramicidin)   . Sulfa Antibiotics Nausea And Vomiting    Current Outpatient Prescriptions  Medication Sig Dispense Refill  . acetaminophen (TYLENOL) 325 MG tablet Take 650 mg by mouth every 6 (six) hours as needed.      [name] amLODipine (NORVASC) 10 MG tablet Take 10 mg by mouth daily.        [name] aspirin 81 MG tablet Take 81 mg by mouth 2 (two) times daily.        . clopidogrel (PLAVIX) 75 MG tablet TAKE 1 TABLET BY MOUTH EVERY DAY  30 tablet  11  . CRESTOR 10 MG tablet TAKE 1 TABLET EVERY DAY  30 tablet  10  . ergocalciferol (VITAMIN D2) 50000 UNITS capsule Take 50,000 Units by mouth once a week.        . esomeprazole (NEXIUM) 40 MG capsule Take 40 mg by mouth daily before breakfast.      . furosemide (LASIX) 40 MG tablet Take 40 mg by mouth daily.      . hydrALAZINE (APRESOLINE) 25 MG tablet Take 25 mg by mouth 2 (two) times  daily.        . insulin glargine (LANTUS) 100 UNIT/ML injection Inject 70 Units into the skin 2 (two) times daily.       [name] lisinopril (PRINIVIL,ZESTRIL) 10 MG tablet Take 10 mg by mouth daily.      . metoCLOPramide (REGLAN) 5 MG tablet Take 5 mg by mouth 3 (three) times daily.        . niacin (NIASPAN) 750 MG CR tablet Take 750 mg by mouth at bedtime.        . nitroGLYCERIN (NITROSTAT) 0.4 MG SL tablet Place 0.4 mg under the tongue every 5 (five) minutes as needed.       No current facility-administered medications for this visit.    Past Medical History  Diagnosis Date  . Hyperlipidemia   . Hypertension   . Ulcer   . Diabetes mellitus   . Systolic murmur   . Hyperglycemia   . Peripheral artery disease     lower extremities  . CAD (coronary artery disease)   . Obstructive sleep apnea   . Vitamin D deficiency   . Stroke   . Chronic kidney disease     Past Surgical History  Procedure Laterality Date  . Coronary artery bypass graft      No family history on file.  History   Social History  . Marital Status: Single  Spouse Name: N/A    Number of Children: N/A  . Years of Education: N/A   Occupational History  . Not on file.   Social History Main Topics  . Smoking status: Former Smoker    Quit date: 01/22/2010  . Smokeless tobacco: Never Used  . Alcohol Use: No  . Drug Use: No  . Sexually Active: Not on file   Other Topics Concern  . Not on file   Social History Narrative  . No narrative on file    Review of systems: The patient specifically denies any chest pain at rest or with exertion, dyspnea at rest, orthopnea, paroxysmal nocturnal dyspnea, syncope, palpitations, focal neurological deficits, lower extremity edema, unexplained weight gain, cough, hemoptysis or wheezing. Severely impaired vision due to complications of diabetes  The patient also denies abdominal pain, nausea, vomiting, dysphagia, diarrhea, constipation, polyuria, polydipsia, dysuria,  hematuria, frequency, urgency, abnormal bleeding or bruising, fever, chills, unexpected weight changes, mood swings, change in skin or hair texture, change in voice quality, auditory or visual problems, allergic reactions or rashes, new musculoskeletal complaints other than usual "aches and pains".   PHYSICAL EXAM BP 140/60  Pulse 82  Ht 5' 0.5" (1.537 m)  Wt 229 lb 8 oz (104.101 kg)  BMI 44.07 kg/m2  General: Alert, oriented x3, no distress Head: no evidence of trauma, PERRL, EOMI, no exophtalmos or lid lag, no myxedema, no xanthelasma; normal ears, nose and oropharynx Neck: normal jugular venous pulsations and no hepatojugular reflux; brisk carotid pulses without delay and no carotid bruits Chest: clear to auscultation, no signs of consolidation by percussion or palpation, normal fremitus, symmetrical and full respiratory excursions Cardiovascular: normal position and quality of the apical impulse, regular rhythm, normal first and second heart sounds, no murmurs, rubs or gallops Abdomen: no tenderness or distention, no masses by palpation, no abnormal pulsatility or arterial bruits, normal bowel sounds, no hepatosplenomegaly Extremities: no clubbing, cyanosis or edema; 2+ upper extremity pulses, there are no palpable pedal pulses. Bilateral femoral bruits femoral pulses are 2+  Neurological: grossly nonfocal   EKG: NSR  Lipid Panel     Component Value Date/Time   CHOL 108 02/22/2013 1411   TRIG 154* 02/22/2013 1411   HDL 32* 02/22/2013 1411   CHOLHDL 3.4 02/22/2013 1411   VLDL 31 02/22/2013 1411   LDLCALC 45 02/22/2013 1411    BMET    Component Value Date/Time   NA 143 02/22/2013 1411   K 4.3 02/22/2013 1411   CL 104 02/22/2013 1411   CO2 29 02/22/2013 1411   GLUCOSE 99 02/22/2013 1411   BUN 24* 02/22/2013 1411   CREATININE 1.24* 02/22/2013 1411   CREATININE 1.65* 09/11/2010 0927   CALCIUM 9.6 02/22/2013 1411   GFRNONAA 31* 09/11/2010 0927   GFRAA  Value: 38        The eGFR has been  calculated using the MDRD equation. This calculation has not been validated in all clinical situations. eGFR's persistently <60 mL/min signify possible Chronic Kidney Disease.* 09/11/2010 1914     ASSESSMENT AND PLAN CAD (coronary artery disease) CABG x5 2011 No symptoms of angina. Status post CABG x5, Dr. Tyrone Sage, October 2011, LIMA to LAD, SVG sequential to intermediate and circumflex, SVG sequential to PDA and PLA  Paroxysmal atrial fibrillation Her loop recorder shows infrequent and brief episodes categorized as atrial fibrillation, but on review they appear to be brief bursts of supraventricular tachycardia and longer episodes of frequent premature atrial contractions, often in bigeminy. One episode that  lasted almost an hour in May actually represents a sinus rhythm with frequent PACs often in a pattern of bigeminy. In the last month since her last loop recorder download she has only had episodes of 4 minutes or less in duration. At this point in time I think she is best served to remain on aspirin and Plavix for the severity of her arterial problems, rather than starting warfarin.  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 On both sides the popliteal arteries reconstitute via collaterals and there is two-vessel runoff with absence of the peroneal arteries. The ABIs are only 0.35 on the right side and 0.3 to the left side. Remarkably she has very few symptoms related to her legs. She has less than 50% plaque in both carotid arteries and has a 50% left renal artery stenosis.  Obstructive sleep apnea Mediocre compliance with CPAP  Hypertension Control is fair. 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 Her most recent creatinine is 1.4  Diabetes mellitus Excellent control. In fact hemoglobin A1c  maybe a little low  Hyperlipidemia Despite excellent control of diabetes her triglycerides remain borderline high and her HDL is low. She is on a very complex system of medications. Having fenofibrate added to this may cause more problems than benefit.   Junious Silk, MD, [facility] [phone] office [phone] pager

## 2013-02-27 NOTE — Assessment & Plan Note (Signed)
Despite excellent control of diabetes her triglycerides remain borderline high and her HDL is low. She is on a very complex system of medications. Having fenofibrate added to this may cause more problems than benefit.

## 2013-03-03 ENCOUNTER — Encounter: Payer: Self-pay | Admitting: Family Medicine

## 2013-03-03 ENCOUNTER — Ambulatory Visit (INDEPENDENT_AMBULATORY_CARE_PROVIDER_SITE_OTHER): Payer: Medicare Other | Admitting: Family Medicine

## 2013-03-03 VITALS — BP 100/40 | HR 80 | Temp 98.2°F | Resp 18 | Wt 230.0 lb

## 2013-03-03 DIAGNOSIS — E109 Type 1 diabetes mellitus without complications: Secondary | ICD-10-CM | POA: Diagnosis not present

## 2013-03-03 DIAGNOSIS — I10 Essential (primary) hypertension: Secondary | ICD-10-CM

## 2013-03-03 DIAGNOSIS — E785 Hyperlipidemia, unspecified: Secondary | ICD-10-CM | POA: Diagnosis not present

## 2013-03-03 DIAGNOSIS — I129 Hypertensive chronic kidney disease with stage 1 through stage 4 chronic kidney disease, or unspecified chronic kidney disease: Secondary | ICD-10-CM | POA: Diagnosis not present

## 2013-03-03 DIAGNOSIS — N183 Chronic kidney disease, stage 3 unspecified: Secondary | ICD-10-CM

## 2013-03-03 DIAGNOSIS — N2581 Secondary hyperparathyroidism of renal origin: Secondary | ICD-10-CM | POA: Diagnosis not present

## 2013-03-03 DIAGNOSIS — D649 Anemia, unspecified: Secondary | ICD-10-CM | POA: Diagnosis not present

## 2013-03-03 NOTE — Progress Notes (Signed)
Subjective:    Patient ID: [name], female    DOB: 01/12/1946, 67 y.o.   MRN: [id number]  HPI Patient is here today for followup of her hypertension, hyperlipidemia, diabetes, and chronic kidney disease. Regards to her blood pressure is well-controlled today at 100/40. The blood pressure and her cardiologist is range from 110-140 over 60s.  However, her home blood pressures are 140-170/70.  She denies chest pain or shortness of breath. She is concerned her blood pressure cuff is inaccurate. She also has insulin-dependent diabetes mellitus. Her most recent hemoglobin A1c is outstanding at 5.8. Her fasting blood sugars range from 90-110. She denies any peripheral neuropathy. She is due for her next eye exam. She denies hypoglycemic episodes. She also has hyperlipidemia with a history of coronary artery disease. Her most recent labwork as listed below. She is currently taking Crestor 10 mg by mouth daily. She denies any myalgia or right upper quadrant pain. Office Visit on 02/23/2013  Component Date Value Range Status  . DEVICE MODEL PM 02/23/2013 [id number]   Final-Edited  . PACEART TECH NOTES PM 02/23/2013    Final-Edited                   Value:Loop check in clinic.  Pt with 0 tachy episodes; 0 brady episodes; 0 asystole; 10 AF episodes since last [facility] transmission 1 month ago. Max duration of the episodes was four mins, max v rate 261, max median v rate of 118. Episodes were                          appropriate/undersensing/oversensing. EGMs would indicate frequent PACs in sometimes a bigeminal pattern---no convincing AF. Plan to follow up remotely on 03-28-2013, and with Dr.Croitoru in 15 months.  Lab on 02/22/2013  Component Date Value Range Status  . Sodium 02/22/2013 147* 135 - 145 mEq/L Final  . Potassium 02/22/2013 4.4  3.5 - 5.3 mEq/L Final  . Chloride 02/22/2013 103  96 - 112 mEq/L Final  . CO2 02/22/2013 29  19 - 32 mEq/L Final  . Glucose, Bld 02/22/2013 100* 70 - 99 mg/dL  Final  . BUN 78/29/5621 24* 6 - 23 mg/dL Final  . Creat 30/86/5784 1.28* 0.50 - 1.10 mg/dL Final  . Albumin 69/62/9528 3.9  3.5 - 5.2 g/dL Final  . Calcium 41/32/4401 9.5  8.4 - 10.5 mg/dL Final  . Phosphorus 02/72/5366 4.0  2.3 - 4.6 mg/dL Final  . HCT 44/10/4740 36.5  36.0 - 46.0 % Final  . Hemoglobin 02/22/2013 11.5* 12.0 - 15.0 g/dL Final  . Magnesium 59/56/3875 2.2  1.5 - 2.5 mg/dL Final  . Vit D, 64-PPIRJJO 02/22/2013 62  30 - 89 ng/mL Final   Comment: This assay accurately quantifies Vitamin D, which is the sum of the                          25-Hydroxy forms of Vitamin D2 and D3.  Studies have shown that the                          optimum concentration of 25-Hydroxy Vitamin D is 30 ng/mL or higher.                           Concentrations of Vitamin D between 20 and 29 ng/mL are considered to  be insufficient and concentrations less than 20 ng/mL are considered                          to be deficient for Vitamin D.  . Color, Urine 02/22/2013 YELLOW  YELLOW Final  . APPearance 02/22/2013 CLEAR  CLEAR Final  . Specific Gravity, Urine 02/22/2013 1.019  1.005 - 1.030 Final  . pH 02/22/2013 5.5  5.0 - 8.0 Final  . Glucose, UA 02/22/2013 NEG  NEG mg/dL Final  . Bilirubin Urine 02/22/2013 NEG  NEG Final  . Ketones, ur 02/22/2013 NEG  NEG mg/dL Final  . Hgb urine dipstick 02/22/2013 NEG  NEG Final  . Protein, ur 02/22/2013 NEG  NEG mg/dL Final  . Urobilinogen, UA 02/22/2013 0.2  0.0 - 1.0 mg/dL Final  . Nitrite 16/05/9603 NEG  NEG Final  . Leukocytes, UA 02/22/2013 LARGE* NEG Final  . Hemoglobin A1C 02/22/2013 5.8* <5.7 % Final   Comment:                                                                                                 According to the ADA Clinical Practice Recommendations for 2011, when                          HbA1c is used as a screening test:                                                       >=6.5%   Diagnostic of Diabetes Mellitus                                      (if abnormal result is confirmed)                                                     5.7-6.4%   Increased risk of developing Diabetes Mellitus                                                     References:Diagnosis and Classification of Diabetes Mellitus,Diabetes                          Care,2011,34(Suppl 1):S62-S69 and Standards of Medical Care in                                  Diabetes - 2011,Diabetes VWUJ,8119,14 (Suppl  1):S11-S61.                             . Mean Plasma Glucose 02/22/2013 120* <117 mg/dL Final  . Squamous Epithelial / LPF 02/22/2013 NONE SEEN  RARE Final  . Crystals 02/22/2013 NONE SEEN  NONE SEEN Final  . Casts 02/22/2013 NONE SEEN  NONE SEEN Final  . WBC, UA 02/22/2013 0-2  <3 WBC/hpf Final  . RBC / HPF 02/22/2013 0-2  <3 RBC/hpf Final  . Bacteria, UA 02/22/2013 NONE SEEN  RARE Final  Office Visit on 02/10/2013  Component Date Value Range Status  . Sodium 02/22/2013 143  135 - 145 mEq/L Final  . Potassium 02/22/2013 4.3  3.5 - 5.3 mEq/L Final  . Chloride 02/22/2013 104  96 - 112 mEq/L Final  . CO2 02/22/2013 29  19 - 32 mEq/L Final  . Glucose, Bld 02/22/2013 99  70 - 99 mg/dL Final  . BUN 16/05/9603 24* 6 - 23 mg/dL Final  . Creat 54/04/8118 1.24* 0.50 - 1.10 mg/dL Final  . Total Bilirubin 02/22/2013 0.3  0.3 - 1.2 mg/dL Final  . Alkaline Phosphatase 02/22/2013 72  39 - 117 U/L Final  . AST 02/22/2013 13  0 - 37 U/L Final  . ALT 02/22/2013 10  0 - 35 U/L Final  . Total Protein 02/22/2013 6.6  6.0 - 8.3 g/dL Final  . Albumin 14/78/2956 3.9  3.5 - 5.2 g/dL Final  . Calcium 21/30/8657 9.6  8.4 - 10.5 mg/dL Final  . Cholesterol 84/69/6295 108  0 - 200 mg/dL Final   Comment: ATP III Classification:                                < 200        mg/dL        Desirable                               200 - 239     mg/dL        Borderline High                               >= 240        mg/dL        High                              . Triglycerides 02/22/2013 154* <150 mg/dL Final  . HDL 28/41/3244 32* >39 mg/dL Final  . Total CHOL/HDL Ratio 02/22/2013 3.4   Final  . VLDL 02/22/2013 31  0 - 40 mg/dL Final  . LDL Cholesterol 02/22/2013 45  0 - 99 mg/dL Final   Comment:                            Total Cholesterol/HDL Ratio:CHD Risk                                                 Coronary Heart Disease Risk Table  Men       Women                                   1/2 Average Risk              3.4        3.3                                       Average Risk              5.0        4.4                                    2X Average Risk              9.6        7.1                                    3X Average Risk             23.4       11.0                          Use the calculated Patient Ratio above and the CHD Risk table                           to determine the patient's CHD Risk.                          ATP III Classification (LDL):                                < 100        mg/dL         Optimal                               100 - 129     mg/dL         Near or Above Optimal                               130 - 159     mg/dL         Borderline High                               160 - 189     mg/dL         High                                > 190        mg/dL         Very High  Past Medical History  Diagnosis Date  . Hyperlipidemia   . Hypertension   . Ulcer   . Diabetes mellitus   . Systolic murmur   . Hyperglycemia   . Peripheral artery disease     lower extremities  . CAD (coronary artery disease)   . Obstructive sleep apnea   . Vitamin D deficiency   . Stroke   . Chronic kidney disease    Past Surgical History  Procedure Laterality Date  . Coronary artery bypass graft     Current Outpatient Prescriptions on File Prior to Visit  Medication Sig Dispense Refill  . acetaminophen (TYLENOL) 325 MG tablet  Take 650 mg by mouth every 6 (six) hours as needed.      [name] amLODipine (NORVASC) 10 MG tablet Take 10 mg by mouth daily.        [name] aspirin 81 MG tablet Take 81 mg by mouth 2 (two) times daily.        . clopidogrel (PLAVIX) 75 MG tablet TAKE 1 TABLET BY MOUTH EVERY DAY  30 tablet  11  . CRESTOR 10 MG tablet TAKE 1 TABLET EVERY DAY  30 tablet  10  . furosemide (LASIX) 40 MG tablet Take 40 mg by mouth daily.      . hydrALAZINE (APRESOLINE) 25 MG tablet Take 25 mg by mouth 2 (two) times daily.        . insulin glargine (LANTUS) 100 UNIT/ML injection Inject 70 Units into the skin 2 (two) times daily.       [name] lisinopril (PRINIVIL,ZESTRIL) 10 MG tablet Take 10 mg by mouth daily.      . metoCLOPramide (REGLAN) 5 MG tablet Take 5 mg by mouth 3 (three) times daily.        [name] NEXIUM 40 MG capsule TAKE 1 CAPSULE BY MOUTH DAILY  30 capsule  11  . niacin (NIASPAN) 750 MG CR tablet Take 750 mg by mouth at bedtime.        . nitroGLYCERIN (NITROSTAT) 0.4 MG SL tablet Place 0.4 mg under the tongue every 5 (five) minutes as needed.       No current facility-administered medications on file prior to visit.   Allergies  Allergen Reactions  . Latex   . Neosporin (Neomycin-Polymyxin-Gramicidin)   . Sulfa Antibiotics Nausea And Vomiting   History   Social History  . Marital Status: Single    Spouse Name: N/A    Number of Children: N/A  . Years of Education: N/A   Occupational History  . Not on file.   Social History Main Topics  . Smoking status: Former Smoker    Quit date: 01/22/2010  . Smokeless tobacco: Never Used  . Alcohol Use: No  . Drug Use: No  . Sexually Active: Not on file   Other Topics Concern  . Not on file   Social History Narrative  . No narrative on file     Review of Systems  All other systems reviewed and are negative.       Objective:   Physical Exam  Vitals reviewed. Constitutional: She appears well-developed and well-nourished.  Eyes: Conjunctivae and EOM are  normal. Pupils are equal, round, and reactive to light. Right eye exhibits no discharge. Left eye exhibits no discharge. No scleral icterus.  Neck: Neck supple. No thyromegaly present.  Cardiovascular: Normal rate, regular rhythm and normal heart sounds.  Exam reveals no gallop and no friction rub.   No murmur heard. Pulmonary/Chest: Effort normal and breath  sounds normal. No respiratory distress. She has no wheezes. She has no rales.  Abdominal: Soft. Bowel sounds are normal. She exhibits no distension and no mass. There is no tenderness. There is no rebound and no guarding.  Lymphadenopathy:    She has no cervical adenopathy.  Skin: Skin is warm. No rash noted. No erythema. No pallor.   diabetic foot exam is performed        Assessment & Plan:  1. Chronic kidney disease, stage III (moderate) Creatinine is stable continue to monitor. Avoid all NSAIDs.  2. Type I (juvenile type) diabetes mellitus without mention of complication, not stated as uncontrolled Currently well controlled without hyperglycemia. Continue Lantus 70 units twice a day. If hypoglycemic episodes begin with will need to decrease insulin use  3. HTN (hypertension) Blood pressure here and her cardiologist are well controlled however these values differ from her home readings.  I have asked her to bring her blood pressure cuff here so that we can check it against ours. If the cuff is inaccurate she will need to discard the cuff. The cuff is accurate I would want to increase her medication to achieve a goal blood pressure less than 140/90.  Patient did bring a blood pressure cuff back in and was off by 15 points. Her family member is going to start checking it manually every day and notify me of the results in a couple of weeks.  If blood pressure is greater than 140/90 we will increase medication.  4. HLD (hyperlipidemia) LDL is at goal. Continued Crestor 10 mg by mouth

## 2013-03-21 ENCOUNTER — Other Ambulatory Visit: Payer: Self-pay | Admitting: Cardiovascular Disease

## 2013-03-21 NOTE — Telephone Encounter (Signed)
Rx was sent to pharmacy electronically. 

## 2013-04-08 ENCOUNTER — Other Ambulatory Visit: Payer: Self-pay | Admitting: Cardiovascular Disease

## 2013-04-08 NOTE — Telephone Encounter (Signed)
Rx was sent to pharmacy electronically. 

## 2013-04-22 ENCOUNTER — Telehealth: Payer: Self-pay | Admitting: Family Medicine

## 2013-04-22 NOTE — Telephone Encounter (Signed)
[facility] Vitamin D 2000 units 1 QD #30

## 2013-04-22 NOTE — Telephone Encounter (Signed)
Lantus Solostar 100 units/mL inject 70 units BID #45

## 2013-04-25 ENCOUNTER — Other Ambulatory Visit: Payer: Self-pay | Admitting: Family Medicine

## 2013-04-25 MED ORDER — VITAMIN D 50 MCG (2000 UT) PO CAPS: 2000.0000 [IU] | ORAL_CAPSULE | Freq: Every day | ORAL | Status: DC

## 2013-04-25 NOTE — Telephone Encounter (Signed)
Rx Refilled  

## 2013-04-25 NOTE — Telephone Encounter (Signed)
Medication refilled per protocol. 

## 2013-04-27 NOTE — Telephone Encounter (Signed)
This was done 04/25/13

## 2013-05-04 ENCOUNTER — Telehealth: Payer: Self-pay | Admitting: Family Medicine

## 2013-05-04 MED ORDER — LISINOPRIL 20 MG PO TABS: 20.0000 mg | ORAL_TABLET | Freq: Every day | ORAL | Status: DC

## 2013-05-04 NOTE — Telephone Encounter (Signed)
.  Patient aware and rx sent to pharmacy    After reviewing pts blood sugars and bp readings Dr. Tanya Nones said that her BS were fine however her BP's were too high for her to increase her Lisinopril to 20 mg qd.

## 2013-05-06 ENCOUNTER — Encounter: Payer: Self-pay | Admitting: Cardiology

## 2013-05-06 ENCOUNTER — Other Ambulatory Visit: Payer: Self-pay | Admitting: Cardiovascular Disease

## 2013-05-19 ENCOUNTER — Encounter: Payer: Self-pay | Admitting: Cardiovascular Disease

## 2013-06-16 ENCOUNTER — Ambulatory Visit (INDEPENDENT_AMBULATORY_CARE_PROVIDER_SITE_OTHER): Payer: Medicare Other | Admitting: Physician Assistant

## 2013-06-16 ENCOUNTER — Encounter: Payer: Self-pay | Admitting: Physician Assistant

## 2013-06-16 VITALS — BP 132/50 | HR 78 | Temp 97.7°F | Resp 20 | Wt 226.0 lb

## 2013-06-16 DIAGNOSIS — L0291 Cutaneous abscess, unspecified: Secondary | ICD-10-CM

## 2013-06-16 MED ORDER — SULFAMETHOXAZOLE-TMP DS 800-160 MG PO TABS: 1.0000 | ORAL_TABLET | Freq: Two times a day (BID) | ORAL | Status: DC

## 2013-06-17 NOTE — Progress Notes (Signed)
Patient ID: [name] MRN: [id number], DOB: 03/26/1946, 67 y.o. Date of Encounter: 06/17/2013, 7:52 AM    Chief Complaint:  Chief Complaint  Patient presents with  . Cyst on back     HPI: 67 y.o. year old white female reports that she had a bump on her right low back that ruptured on Sunday which was 4 days ago. She is here to get it checked and evaluated.     Home Meds: See attached medication section for any medications that were entered at today's visit. The computer does not put those onto this list.The following list is a list of meds entered prior to today's visit.   Current Outpatient Prescriptions on File Prior to Visit  Medication Sig Dispense Refill  . acetaminophen (TYLENOL) 325 MG tablet Take 650 mg by mouth every 6 (six) hours as needed.      [name] amLODipine (NORVASC) 10 MG tablet Take 10 mg by mouth daily.        [name] aspirin 81 MG tablet Take 81 mg by mouth 2 (two) times daily.        . Cholecalciferol (VITAMIN D) 2000 UNITS CAPS Take 1 capsule (2,000 Units total) by mouth daily.  30 capsule  11  . clopidogrel (PLAVIX) 75 MG tablet TAKE 1 TABLET BY MOUTH EVERY DAY  30 tablet  11  . CRESTOR 10 MG tablet TAKE 1 TABLET EVERY DAY  30 tablet  10  . furosemide (LASIX) 40 MG tablet TAKE 1 TABLET EVERY DAY  30 tablet  6  . hydrALAZINE (APRESOLINE) 25 MG tablet Take 25 mg by mouth 2 (two) times daily.        . insulin glargine (LANTUS) 100 UNIT/ML injection Inject 70 Units into the skin 2 (two) times daily.       [name] LANTUS SOLOSTAR 100 UNIT/ML SOPN INJECT 70 UNITS 2 TIMES DAILY  45 pen  5  . lisinopril (PRINIVIL,ZESTRIL) 20 MG tablet Take 1 tablet (20 mg total) by mouth daily.  30 tablet  5  . metoCLOPramide (REGLAN) 5 MG tablet Take 5 mg by mouth 3 (three) times daily.        [name] NEXIUM 40 MG capsule TAKE 1 CAPSULE BY MOUTH DAILY  30 capsule  11  . niacin (NIASPAN) 750 MG CR tablet Take 750 mg by mouth at bedtime.        . nitroGLYCERIN (NITROSTAT) 0.4 MG SL tablet Place 0.4  mg under the tongue every 5 (five) minutes as needed.       No current facility-administered medications on file prior to visit.    Allergies:  Allergies  Allergen Reactions  . Latex   . Neosporin [Neomycin-Polymyxin-Gramicidin]   . Sulfa Antibiotics Nausea And Vomiting      Review of Systems: See HPI for pertinent ROS. All other ROS negative.    Physical Exam: Blood pressure 132/50, pulse 78, temperature 97.7 F (36.5 C), temperature source Oral, resp. rate 20, weight 226 lb (102.513 kg)., Body mass index is 43.39 kg/(m^2). General: Obese WF.  Appears in no acute distress. Lungs: Clear bilaterally to auscultation without wheezes, rales, or rhonchi. Breathing is unlabored. Heart: Regular rhythm. No murmurs, rubs, or gallops. Msk:  Strength and tone normal for age. Skin: On the right low back there is an approximate 1 cm diameter area that is slightly raised with some erythema. I palpated the area and there is no significant residual abscess. This is already drained. Even with palpation there is no  further purulent drainage coming from the site. Neuro: Alert and oriented X 3. Moves all extremities spontaneously. Gait is normal. CNII-XII grossly in tact. Psych:  Responds to questions appropriately with a normal affect.     ASSESSMENT AND PLAN:  67 y.o. year old female with  1. Abscess - sulfamethoxazole-trimethoprim (BACTRIM DS) 800-160 MG per tablet; Take 1 tablet by mouth 2 (two) times daily.  Dispense: 28 tablet; Refill: 0 If does not resolve with completion of antibiotics then followup.  [name] [place], [place], [place] 06/17/2013 7:52 AM

## 2013-07-08 ENCOUNTER — Telehealth: Payer: Self-pay | Admitting: Family Medicine

## 2013-07-08 MED ORDER — NIACIN ER (ANTIHYPERLIPIDEMIC) 750 MG PO TBCR: 750.0000 mg | EXTENDED_RELEASE_TABLET | Freq: Every day | ORAL | Status: DC

## 2013-07-08 NOTE — Telephone Encounter (Signed)
Medication refilled per protocol. 

## 2013-08-15 ENCOUNTER — Encounter: Payer: Self-pay | Admitting: *Deleted

## 2013-08-30 ENCOUNTER — Encounter: Payer: Self-pay | Admitting: Family Medicine

## 2013-08-30 ENCOUNTER — Other Ambulatory Visit: Payer: Self-pay | Admitting: Family Medicine

## 2013-08-30 NOTE — Telephone Encounter (Signed)
Medication refill for one time only.  Patient needs to be seen.  Letter sent for patient to call and schedule 

## 2013-09-29 ENCOUNTER — Other Ambulatory Visit: Payer: Self-pay | Admitting: Family Medicine

## 2013-10-04 ENCOUNTER — Telehealth (HOSPITAL_COMMUNITY): Payer: Self-pay | Admitting: *Deleted

## 2013-10-05 ENCOUNTER — Other Ambulatory Visit: Payer: Self-pay | Admitting: Family Medicine

## 2013-10-17 ENCOUNTER — Other Ambulatory Visit: Payer: Self-pay | Admitting: Family Medicine

## 2013-10-18 ENCOUNTER — Other Ambulatory Visit: Payer: Medicare Other

## 2013-10-18 ENCOUNTER — Other Ambulatory Visit: Payer: Self-pay | Admitting: Family Medicine

## 2013-10-18 DIAGNOSIS — E785 Hyperlipidemia, unspecified: Secondary | ICD-10-CM

## 2013-10-18 DIAGNOSIS — I10 Essential (primary) hypertension: Secondary | ICD-10-CM | POA: Diagnosis not present

## 2013-10-18 DIAGNOSIS — Z79899 Other long term (current) drug therapy: Secondary | ICD-10-CM

## 2013-10-18 DIAGNOSIS — E119 Type 2 diabetes mellitus without complications: Secondary | ICD-10-CM | POA: Diagnosis not present

## 2013-10-18 LAB — HEMOGLOBIN A1C
Hgb A1c MFr Bld: 6.8 % — ABNORMAL HIGH (ref <5.7)
MEAN PLASMA GLUCOSE: 148 mg/dL — AB (ref <117)

## 2013-10-18 LAB — COMPREHENSIVE METABOLIC PANEL
ALT: 10 U/L (ref 0–35)
AST: 12 U/L (ref 0–37)
Albumin: 3.5 g/dL (ref 3.5–5.2)
Alkaline Phosphatase: 51 U/L (ref 39–117)
BILIRUBIN TOTAL: 0.3 mg/dL (ref 0.2–1.2)
BUN: 20 mg/dL (ref 6–23)
CO2: 27 mEq/L (ref 19–32)
Calcium: 8.8 mg/dL (ref 8.4–10.5)
Chloride: 101 mEq/L (ref 96–112)
Creat: 1.32 mg/dL — ABNORMAL HIGH (ref 0.50–1.10)
Glucose, Bld: 147 mg/dL — ABNORMAL HIGH (ref 70–99)
Potassium: 4.1 mEq/L (ref 3.5–5.3)
Sodium: 144 mEq/L (ref 135–145)
TOTAL PROTEIN: 6 g/dL (ref 6.0–8.3)

## 2013-10-18 LAB — LIPID PANEL
Cholesterol: 99 mg/dL (ref 0–200)
HDL: 27 mg/dL — ABNORMAL LOW (ref >39)
LDL CALC: 38 mg/dL (ref 0–99)
Total CHOL/HDL Ratio: 3.7 Ratio
Triglycerides: 172 mg/dL — ABNORMAL HIGH (ref <150)
VLDL: 34 mg/dL (ref 0–40)

## 2013-10-19 LAB — CBC WITH DIFFERENTIAL/PLATELET
Basophils Absolute: 0.1 10*3/uL (ref 0.0–0.1)
Basophils Relative: 1 % (ref 0–1)
EOS ABS: 0.3 10*3/uL (ref 0.0–0.7)
Eosinophils Relative: 4 % (ref 0–5)
HCT: 33.1 % — ABNORMAL LOW (ref 36.0–46.0)
Hemoglobin: 10.3 g/dL — ABNORMAL LOW (ref 12.0–15.0)
Lymphocytes Relative: 20 % (ref 12–46)
Lymphs Abs: 1.6 10*3/uL (ref 0.7–4.0)
MCH: 28.1 pg (ref 26.0–34.0)
MCHC: 31.1 g/dL (ref 30.0–36.0)
MCV: 90.2 fL (ref 78.0–100.0)
Monocytes Absolute: 0.5 10*3/uL (ref 0.1–1.0)
Monocytes Relative: 6 % (ref 3–12)
NEUTROS PCT: 69 % (ref 43–77)
Neutro Abs: 5.4 10*3/uL (ref 1.7–7.7)
PLATELETS: 178 10*3/uL (ref 150–400)
RBC: 3.67 MIL/uL — AB (ref 3.87–5.11)
RDW: 15.9 % — ABNORMAL HIGH (ref 11.5–15.5)
WBC: 7.8 10*3/uL (ref 4.0–10.5)

## 2013-10-25 DIAGNOSIS — I10 Essential (primary) hypertension: Secondary | ICD-10-CM | POA: Diagnosis not present

## 2013-10-25 DIAGNOSIS — M948X9 Other specified disorders of cartilage, unspecified sites: Secondary | ICD-10-CM | POA: Diagnosis not present

## 2013-10-25 DIAGNOSIS — N183 Chronic kidney disease, stage 3 unspecified: Secondary | ICD-10-CM | POA: Diagnosis not present

## 2013-10-25 DIAGNOSIS — D631 Anemia in chronic kidney disease: Secondary | ICD-10-CM | POA: Diagnosis not present

## 2013-10-30 ENCOUNTER — Other Ambulatory Visit: Payer: Self-pay | Admitting: Family Medicine

## 2013-11-15 ENCOUNTER — Encounter: Payer: Self-pay | Admitting: Family Medicine

## 2013-11-15 ENCOUNTER — Ambulatory Visit (INDEPENDENT_AMBULATORY_CARE_PROVIDER_SITE_OTHER): Payer: Medicare Other | Admitting: Family Medicine

## 2013-11-15 VITALS — BP 138/80 | HR 86 | Temp 97.5°F | Resp 24 | Ht <= 58 in | Wt 232.0 lb

## 2013-11-15 DIAGNOSIS — I10 Essential (primary) hypertension: Secondary | ICD-10-CM

## 2013-11-15 DIAGNOSIS — E785 Hyperlipidemia, unspecified: Secondary | ICD-10-CM | POA: Diagnosis not present

## 2013-11-15 DIAGNOSIS — E119 Type 2 diabetes mellitus without complications: Secondary | ICD-10-CM

## 2013-11-15 NOTE — Progress Notes (Signed)
Subjective:    Patient ID: [name], female    DOB: [birth year], 68 y.o.   MRN: [id number]  HPI Patient is here today complaining of 10 days of nausea and vomiting. She denies any fevers or chills. She vomits after she eats. She denies any abdominal pain. She denies any melena or hematochezia. She does have a history of diabetes mellitus with peripheral neuropathy. She also has a history of gastroparesis. She is supposed to be taking her Reglan 10 mg by mouth Q. a.c. and at bedtime. She has not been taking medication at all. She is still passing flatus. She is to have irregular bowel movements. She also complains of severe 2. She has a history of obstructive sleep apnea which is mild and her CPAP machine. Her housemate states that she frequently snores and he hears her stop breathing. The patient states that she was physically tired all the time and very sleepy. Chest history of hypertension, hyperlipidemia, and diabetes mellitus type 2. Her most recent lab work is listed below:  Lab on 10/18/2013  Component Date Value Ref Range Status  . WBC 10/18/2013 7.8  4.0 - 10.5 K/uL Final  . RBC 10/18/2013 3.67* 3.87 - 5.11 MIL/uL Final  . Hemoglobin 10/18/2013 10.3* 12.0 - 15.0 g/dL Final  . HCT 10/18/2013 33.1* 36.0 - 46.0 % Final  . MCV 10/18/2013 90.2  78.0 - 100.0 fL Final  . MCH 10/18/2013 28.1  26.0 - 34.0 pg Final  . MCHC 10/18/2013 31.1  30.0 - 36.0 g/dL Final  . RDW 10/18/2013 15.9* 11.5 - 15.5 % Final  . Platelets 10/18/2013 178  150 - 400 K/uL Final  . Neutrophils Relative % 10/18/2013 69  43 - 77 % Final  . Neutro Abs 10/18/2013 5.4  1.7 - 7.7 K/uL Final  . Lymphocytes Relative 10/18/2013 20  12 - 46 % Final  . Lymphs Abs 10/18/2013 1.6  0.7 - 4.0 K/uL Final  . Monocytes Relative 10/18/2013 6  3 - 12 % Final  . Monocytes Absolute 10/18/2013 0.5  0.1 - 1.0 K/uL Final  . Eosinophils Relative 10/18/2013 4  0 - 5 % Final  . Eosinophils Absolute 10/18/2013 0.3  0.0 - 0.7 K/uL Final    . Basophils Relative 10/18/2013 1  0 - 1 % Final  . Basophils Absolute 10/18/2013 0.1  0.0 - 0.1 K/uL Final  . Smear Review 10/18/2013 Criteria for review not met   Final  . Sodium 10/18/2013 144  135 - 145 mEq/L Final  . Potassium 10/18/2013 4.1  3.5 - 5.3 mEq/L Final  . Chloride 10/18/2013 101  96 - 112 mEq/L Final  . CO2 10/18/2013 27  19 - 32 mEq/L Final  . Glucose, Bld 10/18/2013 147* 70 - 99 mg/dL Final  . BUN 10/18/2013 20  6 - 23 mg/dL Final  . Creat 10/18/2013 1.32* 0.50 - 1.10 mg/dL Final  . Total Bilirubin 10/18/2013 0.3  0.2 - 1.2 mg/dL Final  . Alkaline Phosphatase 10/18/2013 51  39 - 117 U/L Final  . AST 10/18/2013 12  0 - 37 U/L Final  . ALT 10/18/2013 10  0 - 35 U/L Final  . Total Protein 10/18/2013 6.0  6.0 - 8.3 g/dL Final  . Albumin 10/18/2013 3.5  3.5 - 5.2 g/dL Final  . Calcium 10/18/2013 8.8  8.4 - 10.5 mg/dL Final  . Cholesterol 10/18/2013 99  0 - 200 mg/dL Final   Comment: ATP III Classification:                                <  200        mg/dL        Desirable                               200 - 239     mg/dL        Borderline High                               >= 240        mg/dL        High                             . Triglycerides 10/18/2013 172* <150 mg/dL Final  . HDL 10/18/2013 27* >39 mg/dL Final  . Total CHOL/HDL Ratio 10/18/2013 3.7   Final  . VLDL 10/18/2013 34  0 - 40 mg/dL Final  . LDL Cholesterol 10/18/2013 38  0 - 99 mg/dL Final   Comment:                            Total Cholesterol/HDL Ratio:CHD Risk                                                 Coronary Heart Disease Risk Table                                                                 Men       Women                                   1/2 Average Risk              3.4        3.3                                       Average Risk              5.0        4.4                                    2X Average Risk              9.6        7.1                                    3X Average  Risk             23.4       11.0  Use the calculated Patient Ratio above and the CHD Risk table                           to determine the patient's CHD Risk.                          ATP III Classification (LDL):                                < 100        mg/dL         Optimal                               100 - 129     mg/dL         Near or Above Optimal                               130 - 159     mg/dL         Borderline High                               160 - 189     mg/dL         High                                > 190        mg/dL         Very High                             . Hemoglobin A1C 10/18/2013 6.8* <5.7 % Final   Comment:                                                                                                 According to the ADA Clinical Practice Recommendations for 2011, when                          HbA1c is used as a screening test:                                                       >=6.5%   Diagnostic of Diabetes Mellitus                                     (if abnormal result is confirmed)  5.7-6.4%   Increased risk of developing Diabetes Mellitus                                                     References:Diagnosis and Classification of Diabetes Mellitus,Diabetes                          [id number],0102,72(ZDGUY 1):S62-S69 and Standards of Medical Care in                                  Diabetes - 2011,Diabetes [id number],7425,95 (Suppl 1):S11-S61.                             . Mean Plasma Glucose 10/18/2013 148* <117 mg/dL Final   Past Medical History  Diagnosis Date  . Hyperlipidemia   . Hypertension   . Ulcer   . Diabetes mellitus   . Systolic murmur   . Hyperglycemia   . Peripheral artery disease     lower extremities  . CAD (coronary artery disease)   . Obstructive sleep apnea   . Vitamin D deficiency   . Stroke   . Chronic kidney disease    Current Outpatient  Prescriptions on File Prior to Visit  Medication Sig Dispense Refill  . acetaminophen (TYLENOL) 325 MG tablet Take 650 mg by mouth every 6 (six) hours as needed.      [name] amLODipine (NORVASC) 10 MG tablet Take 10 mg by mouth daily.        [name] aspirin 81 MG tablet Take 81 mg by mouth 2 (two) times daily.        . Cholecalciferol (VITAMIN D) 2000 UNITS CAPS Take 1 capsule (2,000 Units total) by mouth daily.  30 capsule  11  . clopidogrel (PLAVIX) 75 MG tablet TAKE 1 TABLET BY MOUTH EVERY DAY  30 tablet  11  . CRESTOR 10 MG tablet TAKE 1 TABLET EVERY DAY  30 tablet  10  . furosemide (LASIX) 40 MG tablet TAKE 1 TABLET EVERY DAY  30 tablet  6  . hydrALAZINE (APRESOLINE) 25 MG tablet Take 25 mg by mouth 2 (two) times daily.        . insulin glargine (LANTUS) 100 UNIT/ML injection Inject 70 Units into the skin 2 (two) times daily.       [name] LANTUS SOLOSTAR 100 UNIT/ML Solostar Pen INJECT 70 UNITS 2 TIMES DAILY  45 pen  5  . lisinopril (PRINIVIL,ZESTRIL) 20 MG tablet TAKE 1 TABLET BY MOUTH DAILY  30 tablet  1  . metoCLOPramide (REGLAN) 5 MG tablet TAKE 1 TABLET BY MOUTH 3 TIMES A DAY BEFORE MEALS AND 1 AT BEDTIME  120 tablet  11  . NEXIUM 40 MG capsule TAKE 1 CAPSULE BY MOUTH DAILY  30 capsule  11  . niacin (NIASPAN) 750 MG CR tablet TAKE 1 TABLET BY MOUTH EVERY DAY AT BEDTIME  90 tablet  4  . nitroGLYCERIN (NITROSTAT) 0.4 MG SL tablet Place 0.4 mg under the tongue every 5 (five) minutes as needed.       No current facility-administered medications on file prior to visit.   Allergies  Allergen Reactions  . Latex   . Neosporin [Neomycin-Polymyxin-Gramicidin]   .  Sulfa Antibiotics Nausea And Vomiting   History   Social History  . Marital Status: Single    Spouse Name: N/A    Number of Children: N/A  . Years of Education: N/A   Occupational History  . Not on file.   Social History Main Topics  . Smoking status: Former Smoker    Quit date: 01/22/2010  . Smokeless tobacco: Never Used  . Alcohol  Use: No  . Drug Use: No  . Sexual Activity: Not on file   Other Topics Concern  . Not on file   Social History Narrative  . No narrative on file      Review of Systems  All other systems reviewed and are negative.      Objective:   Physical Exam  Vitals reviewed. Constitutional: She appears well-developed and well-nourished. No distress.  HENT:  Head: Normocephalic and atraumatic.  Right Ear: External ear normal.  Left Ear: External ear normal.  Nose: Nose normal.  Mouth/Throat: Oropharynx is clear and moist. No oropharyngeal exudate.  Eyes: Conjunctivae are normal. No scleral icterus.  Neck: Neck supple. No JVD present. No thyromegaly present.  Cardiovascular: Normal rate, regular rhythm and normal heart sounds.  Exam reveals no gallop and no friction rub.   No murmur heard. Pulmonary/Chest: Effort normal and breath sounds normal. No respiratory distress. She has no wheezes. She has no rales. She exhibits no tenderness.  Abdominal: Soft. Bowel sounds are normal. She exhibits no distension. There is no tenderness. There is no rebound and no guarding.  Musculoskeletal: She exhibits no edema.  Lymphadenopathy:    She has no cervical adenopathy.  Skin: She is not diaphoretic.          Assessment & Plan:  1. Type II or unspecified type diabetes mellitus without mention of complication, not stated as uncontrolled Patient's hemoglobin A1c is excessively controlled. She is also taking an aspirin. I recommended annual eye exam. I also believe she is suffering from gastroparesis. I recommended she resume Reglan 5-10 mg by mouth daily CHS. She is to call me back in 2-3 days if her knowledge is not improving.  2. Essential hypertension, benign Blood pressures well controlled. However I believe her fatigue is likely due to uncontrolled sleep apnea. I recommended compliance with CPAP therapy. The patient has a machine at home she is just not using it.  3. HLD  (hyperlipidemia) Patient's LDL cholesterol is excellent. However she has significant dyslipidemia with an HDL cholesterol 27. I recommended increasing aerobic exercise to help raise your good cholesterol

## 2013-11-16 ENCOUNTER — Other Ambulatory Visit: Payer: Self-pay | Admitting: *Deleted

## 2013-11-16 MED ORDER — ROSUVASTATIN CALCIUM 10 MG PO TABS: 10.0000 mg | ORAL_TABLET | Freq: Every day | ORAL | Status: DC

## 2013-11-16 NOTE — Telephone Encounter (Signed)
Rx refill sent to patients pharmacy  

## 2013-11-18 ENCOUNTER — Other Ambulatory Visit: Payer: Self-pay | Admitting: *Deleted

## 2013-11-22 ENCOUNTER — Other Ambulatory Visit: Payer: Self-pay | Admitting: *Deleted

## 2013-11-28 ENCOUNTER — Other Ambulatory Visit: Payer: Self-pay | Admitting: *Deleted

## 2013-11-28 MED ORDER — FUROSEMIDE 40 MG PO TABS: 40.0000 mg | ORAL_TABLET | Freq: Every day | ORAL | Status: DC

## 2013-11-28 NOTE — Telephone Encounter (Signed)
Rx refill sent to patient pharmacy   

## 2013-12-20 ENCOUNTER — Other Ambulatory Visit: Payer: Self-pay | Admitting: *Deleted

## 2013-12-20 MED ORDER — HYDRALAZINE HCL 25 MG PO TABS: 25.0000 mg | ORAL_TABLET | Freq: Two times a day (BID) | ORAL | Status: DC

## 2013-12-22 ENCOUNTER — Other Ambulatory Visit: Payer: Self-pay | Admitting: Family Medicine

## 2013-12-22 NOTE — Telephone Encounter (Signed)
Refill appropriate and filled per protocol. 

## 2014-01-06 ENCOUNTER — Other Ambulatory Visit: Payer: Self-pay | Admitting: Family Medicine

## 2014-01-16 ENCOUNTER — Encounter: Payer: Self-pay | Admitting: *Deleted

## 2014-01-30 ENCOUNTER — Other Ambulatory Visit: Payer: Self-pay | Admitting: Family Medicine

## 2014-02-06 ENCOUNTER — Other Ambulatory Visit: Payer: Self-pay | Admitting: Cardiovascular Disease

## 2014-02-07 NOTE — Telephone Encounter (Signed)
Rx was sent to pharmacy electronically. 

## 2014-02-20 ENCOUNTER — Other Ambulatory Visit: Payer: Self-pay | Admitting: *Deleted

## 2014-02-23 ENCOUNTER — Other Ambulatory Visit: Payer: Self-pay | Admitting: Family Medicine

## 2014-02-23 ENCOUNTER — Other Ambulatory Visit: Payer: Medicare Other

## 2014-02-23 DIAGNOSIS — R7309 Other abnormal glucose: Secondary | ICD-10-CM | POA: Diagnosis not present

## 2014-02-23 DIAGNOSIS — E559 Vitamin D deficiency, unspecified: Secondary | ICD-10-CM | POA: Diagnosis not present

## 2014-02-23 DIAGNOSIS — E785 Hyperlipidemia, unspecified: Secondary | ICD-10-CM | POA: Diagnosis not present

## 2014-02-23 DIAGNOSIS — R739 Hyperglycemia, unspecified: Secondary | ICD-10-CM

## 2014-02-23 DIAGNOSIS — Z79899 Other long term (current) drug therapy: Secondary | ICD-10-CM | POA: Diagnosis not present

## 2014-02-23 DIAGNOSIS — I10 Essential (primary) hypertension: Secondary | ICD-10-CM

## 2014-02-23 LAB — COMPLETE METABOLIC PANEL WITH GFR
ALK PHOS: 56 U/L (ref 39–117)
ALT: 9 U/L (ref 0–35)
AST: 13 U/L (ref 0–37)
Albumin: 3.8 g/dL (ref 3.5–5.2)
BUN: 21 mg/dL (ref 6–23)
CO2: 29 mEq/L (ref 19–32)
CREATININE: 1.48 mg/dL — AB (ref 0.50–1.10)
Calcium: 9.3 mg/dL (ref 8.4–10.5)
Chloride: 101 mEq/L (ref 96–112)
GFR, EST NON AFRICAN AMERICAN: 36 mL/min — AB
GFR, Est African American: 41 mL/min — ABNORMAL LOW
Glucose, Bld: 160 mg/dL — ABNORMAL HIGH (ref 70–99)
Potassium: 4.1 mEq/L (ref 3.5–5.3)
Sodium: 144 mEq/L (ref 135–145)
Total Bilirubin: 0.2 mg/dL (ref 0.2–1.2)
Total Protein: 6.6 g/dL (ref 6.0–8.3)

## 2014-02-23 LAB — CBC WITH DIFFERENTIAL/PLATELET
BASOS ABS: 0.1 10*3/uL (ref 0.0–0.1)
BASOS PCT: 1 % (ref 0–1)
EOS ABS: 0.4 10*3/uL (ref 0.0–0.7)
EOS PCT: 3 % (ref 0–5)
HEMATOCRIT: 35.1 % — AB (ref 36.0–46.0)
Hemoglobin: 11.1 g/dL — ABNORMAL LOW (ref 12.0–15.0)
Lymphocytes Relative: 17 % (ref 12–46)
Lymphs Abs: 2.1 10*3/uL (ref 0.7–4.0)
MCH: 28.3 pg (ref 26.0–34.0)
MCHC: 31.6 g/dL (ref 30.0–36.0)
MCV: 89.5 fL (ref 78.0–100.0)
MONO ABS: 1 10*3/uL (ref 0.1–1.0)
Monocytes Relative: 8 % (ref 3–12)
NEUTROS ABS: 8.7 10*3/uL — AB (ref 1.7–7.7)
Neutrophils Relative %: 71 % (ref 43–77)
Platelets: 182 10*3/uL (ref 150–400)
RBC: 3.92 MIL/uL (ref 3.87–5.11)
RDW: 16.4 % — AB (ref 11.5–15.5)
WBC: 12.3 10*3/uL — ABNORMAL HIGH (ref 4.0–10.5)

## 2014-02-23 LAB — LIPID PANEL
Cholesterol: 100 mg/dL (ref 0–200)
HDL: 29 mg/dL — AB (ref >39)
LDL Cholesterol: 34 mg/dL (ref 0–99)
TRIGLYCERIDES: 186 mg/dL — AB (ref <150)
Total CHOL/HDL Ratio: 3.4 Ratio
VLDL: 37 mg/dL (ref 0–40)

## 2014-02-23 LAB — HEMOGLOBIN A1C
HEMOGLOBIN A1C: 7.5 % — AB (ref <5.7)
Mean Plasma Glucose: 169 mg/dL — ABNORMAL HIGH (ref <117)

## 2014-02-23 NOTE — Telephone Encounter (Signed)
Refill appropriate and filled per protocol. 

## 2014-03-02 DIAGNOSIS — I10 Essential (primary) hypertension: Secondary | ICD-10-CM | POA: Diagnosis not present

## 2014-03-02 DIAGNOSIS — N183 Chronic kidney disease, stage 3 unspecified: Secondary | ICD-10-CM | POA: Diagnosis not present

## 2014-03-02 DIAGNOSIS — M948X9 Other specified disorders of cartilage, unspecified sites: Secondary | ICD-10-CM | POA: Diagnosis not present

## 2014-03-02 DIAGNOSIS — D631 Anemia in chronic kidney disease: Secondary | ICD-10-CM | POA: Diagnosis not present

## 2014-03-08 ENCOUNTER — Other Ambulatory Visit: Payer: Self-pay | Admitting: Family Medicine

## 2014-03-08 ENCOUNTER — Encounter: Payer: Self-pay | Admitting: Family Medicine

## 2014-03-17 ENCOUNTER — Encounter: Payer: Self-pay | Admitting: Family Medicine

## 2014-03-17 ENCOUNTER — Ambulatory Visit (INDEPENDENT_AMBULATORY_CARE_PROVIDER_SITE_OTHER): Payer: Medicare Other | Admitting: Family Medicine

## 2014-03-17 VITALS — BP 104/58 | HR 100 | Temp 98.4°F | Resp 24 | Ht <= 58 in | Wt 228.0 lb

## 2014-03-17 DIAGNOSIS — E119 Type 2 diabetes mellitus without complications: Secondary | ICD-10-CM

## 2014-03-17 DIAGNOSIS — I10 Essential (primary) hypertension: Secondary | ICD-10-CM | POA: Diagnosis not present

## 2014-03-17 DIAGNOSIS — E785 Hyperlipidemia, unspecified: Secondary | ICD-10-CM | POA: Diagnosis not present

## 2014-03-17 NOTE — Progress Notes (Signed)
Subjective:    Patient ID: [name], female    DOB: June 14, 1946, 68 y.o.   MRN: [id number]  HPI Patient is an 68 year old white female who presents today for followup of her diabetes mellitus which is insulin-dependent, hypertension, and hyperlipidemia. Her heart rate is slightly elevated today at 100. Furthermore most recent renal function showed a deterioration in her creatinine. The patient appears mildly dehydrated. She is also complaining of dizziness. Otherwise she feels normal. Her fasting blood sugars are typically 1:30 to 160. Her hemoglobin A1c has risen from 6.8-7.5. The patient is not engaging in any regular aerobic exercise.  Her blood pressure is currently well controlled at 104/58. She denies any chest pain or shortness of breath or dyspnea on exertion. However she is relatively sedentary. She does complain of claudication in both legs with ambulation.  She has known peripheral vascular disease. She denies myalgias right quadrant pain on her statin. Her most recent lab work is listed below: Lab on 02/23/2014  Component Date Value Ref Range Status  . Cholesterol 02/23/2014 100  0 - 200 mg/dL Final   Comment: ATP III Classification:                                < 200        mg/dL        Desirable                               200 - 239     mg/dL        Borderline High                               >= 240        mg/dL        High                             . Triglycerides 02/23/2014 186* <150 mg/dL Final  . HDL 02/23/2014 29* >39 mg/dL Final  . Total CHOL/HDL Ratio 02/23/2014 3.4   Final  . VLDL 02/23/2014 37  0 - 40 mg/dL Final  . LDL Cholesterol 02/23/2014 34  0 - 99 mg/dL Final   Comment:                            Total Cholesterol/HDL Ratio:CHD Risk                                                 Coronary Heart Disease Risk Table                                                                 Men       Women  1/2 Average Risk               3.4        3.3                                       Average Risk              5.0        4.4                                    2X Average Risk              9.6        7.1                                    3X Average Risk             23.4       11.0                          Use the calculated Patient Ratio above and the CHD Risk table                           to determine the patient's CHD Risk.                          ATP III Classification (LDL):                                < 100        mg/dL         Optimal                               100 - 129     mg/dL         Near or Above Optimal                               130 - 159     mg/dL         Borderline High                               160 - 189     mg/dL         High                                > 190        mg/dL         Very High                             . WBC 02/23/2014 12.3* 4.0 - 10.5 K/uL Final  . RBC 02/23/2014 3.92  3.87 - 5.11 MIL/uL Final  . Hemoglobin  02/23/2014 11.1* 12.0 - 15.0 g/dL Final  . HCT 02/23/2014 35.1* 36.0 - 46.0 % Final  . MCV 02/23/2014 89.5  78.0 - 100.0 fL Final  . MCH 02/23/2014 28.3  26.0 - 34.0 pg Final  . MCHC 02/23/2014 31.6  30.0 - 36.0 g/dL Final  . RDW 02/23/2014 16.4* 11.5 - 15.5 % Final  . Platelets 02/23/2014 182  150 - 400 K/uL Final  . Neutrophils Relative % 02/23/2014 71  43 - 77 % Final  . Neutro Abs 02/23/2014 8.7* 1.7 - 7.7 K/uL Final  . Lymphocytes Relative 02/23/2014 17  12 - 46 % Final  . Lymphs Abs 02/23/2014 2.1  0.7 - 4.0 K/uL Final  . Monocytes Relative 02/23/2014 8  3 - 12 % Final  . Monocytes Absolute 02/23/2014 1.0  0.1 - 1.0 K/uL Final  . Eosinophils Relative 02/23/2014 3  0 - 5 % Final  . Eosinophils Absolute 02/23/2014 0.4  0.0 - 0.7 K/uL Final  . Basophils Relative 02/23/2014 1  0 - 1 % Final  . Basophils Absolute 02/23/2014 0.1  0.0 - 0.1 K/uL Final  . Smear Review 02/23/2014 Criteria for review not met   Final  . Hemoglobin A1C 02/23/2014 7.5* <5.7 %  Final   Comment:                                                                                                 According to the ADA Clinical Practice Recommendations for 2011, when                          HbA1c is used as a screening test:                                                       >=6.5%   Diagnostic of Diabetes Mellitus                                     (if abnormal result is confirmed)                                                     5.7-6.4%   Increased risk of developing Diabetes Mellitus                                                     References:Diagnosis and Classification of Diabetes Mellitus,Diabetes  [id number],8119,14(NWGNF 1):S62-S69 and Standards of Medical Care in                                  Diabetes - 2011,Diabetes [id number],0865,78 (Suppl 1):S11-S61.                             . Mean Plasma Glucose 02/23/2014 169* <117 mg/dL Final  . Sodium 02/23/2014 144  135 - 145 mEq/L Final  . Potassium 02/23/2014 4.1  3.5 - 5.3 mEq/L Final  . Chloride 02/23/2014 101  96 - 112 mEq/L Final  . CO2 02/23/2014 29  19 - 32 mEq/L Final  . Glucose, Bld 02/23/2014 160* 70 - 99 mg/dL Final  . BUN 02/23/2014 21  6 - 23 mg/dL Final  . Creat 02/23/2014 1.48* 0.50 - 1.10 mg/dL Final  . Total Bilirubin 02/23/2014 0.2  0.2 - 1.2 mg/dL Final  . Alkaline Phosphatase 02/23/2014 56  39 - 117 U/L Final  . AST 02/23/2014 13  0 - 37 U/L Final  . ALT 02/23/2014 9  0 - 35 U/L Final  . Total Protein 02/23/2014 6.6  6.0 - 8.3 g/dL Final  . Albumin 02/23/2014 3.8  3.5 - 5.2 g/dL Final  . Calcium 02/23/2014 9.3  8.4 - 10.5 mg/dL Final  . GFR, Est African American 02/23/2014 41*  Final  . GFR, Est Non African American 02/23/2014 36*  Final   Comment:                            The estimated GFR is a calculation valid for adults (>=60 years old)                          that uses the CKD-EPI algorithm to adjust for age and sex. It is                            not to  be used for children, pregnant women, hospitalized patients,                             patients on dialysis, or with rapidly changing kidney function.                          According to the NKDEP, eGFR >89 is normal, 60-89 shows mild                          impairment, 30-59 shows moderate impairment, 15-29 shows severe                          impairment and <15 is ESRD.                              Past Medical History  Diagnosis Date  . Hyperlipidemia   . Hypertension   . Ulcer   . Diabetes mellitus   . Systolic murmur   . Hyperglycemia   . Peripheral artery disease     lower extremities  . CAD (coronary artery disease)   . Obstructive sleep apnea   .  Vitamin D deficiency   . Stroke   . Chronic kidney disease    Past Surgical History  Procedure Laterality Date  . Coronary artery bypass graft     Current Outpatient Prescriptions on File Prior to Visit  Medication Sig Dispense Refill  . acetaminophen (TYLENOL) 325 MG tablet Take 650 mg by mouth every 6 (six) hours as needed.      [name] amLODipine (NORVASC) 10 MG tablet Take 10 mg by mouth daily.        [name] aspirin 81 MG tablet Take 81 mg by mouth 2 (two) times daily.        . B-D ULTRAFINE III SHORT PEN 31G X 8 MM MISC USE AS DIRECTED  100 each  2  . clopidogrel (PLAVIX) 75 MG tablet Take 1 tablet (75 mg total) by mouth daily. <please schedule appointment for future refills>  30 tablet  1  . CVS VITAMIN D 2000 UNITS CAPS TAKE ONE CAPSULE BY MOUTH DAILY  30 capsule  5  . CVS VITAMIN D 2000 UNITS CAPS TAKE ONE CAPSULE BY MOUTH DAILY  30 capsule  5  . furosemide (LASIX) 40 MG tablet Take 1 tablet (40 mg total) by mouth daily.  30 tablet  6  . LANTUS SOLOSTAR 100 UNIT/ML Solostar Pen INJECT 70 UNITS 2 TIMES DAILY  14 pen  11  . lisinopril (PRINIVIL,ZESTRIL) 20 MG tablet TAKE 1 TABLET BY MOUTH DAILY  30 tablet  11  . metoCLOPramide (REGLAN) 5 MG tablet TAKE 1 TABLET BY MOUTH 3 TIMES A DAY BEFORE MEALS AND 1 AT BEDTIME  120 tablet   11  . NEXIUM 40 MG capsule TAKE 1 CAPSULE BY MOUTH DAILY  30 capsule  11  . niacin (NIASPAN) 750 MG CR tablet TAKE 1 TABLET BY MOUTH EVERY DAY AT BEDTIME  90 tablet  4  . nitroGLYCERIN (NITROSTAT) 0.4 MG SL tablet Place 0.4 mg under the tongue every 5 (five) minutes as needed.      . rosuvastatin (CRESTOR) 10 MG tablet Take 1 tablet (10 mg total) by mouth daily.  30 tablet  10   No current facility-administered medications on file prior to visit.   Allergies  Allergen Reactions  . Latex   . Neosporin [Neomycin-Polymyxin-Gramicidin]   . Sulfa Antibiotics Nausea And Vomiting   History   Social History  . Marital Status: Single    Spouse Name: N/A    Number of Children: N/A  . Years of Education: N/A   Occupational History  . Not on file.   Social History Main Topics  . Smoking status: Former Smoker    Quit date: 01/22/2010  . Smokeless tobacco: Never Used  . Alcohol Use: No  . Drug Use: No  . Sexual Activity: Not on file   Other Topics Concern  . Not on file   Social History Narrative  . No narrative on file      Review of Systems  All other systems reviewed and are negative.      Objective:   Physical Exam  Vitals reviewed. Constitutional: She is oriented to person, place, and time. She appears well-developed and well-nourished.  Neck: Neck supple. No JVD present. No thyromegaly present.  Cardiovascular: Normal rate, regular rhythm and normal heart sounds.   No murmur heard. Pulmonary/Chest: Effort normal and breath sounds normal. No respiratory distress. She has no wheezes. She has no rales.  Abdominal: Soft. Bowel sounds are normal. She exhibits no distension and no mass. There is  no tenderness. There is no rebound and no guarding.  Musculoskeletal: She exhibits no edema.  Lymphadenopathy:    She has no cervical adenopathy.  Neurological: She is alert and oriented to person, place, and time. She has normal reflexes. She displays normal reflexes. No cranial  nerve deficit. She exhibits normal muscle tone. Coordination normal.          Assessment & Plan:  Type II or unspecified type diabetes mellitus without mention of complication, not stated as uncontrolled  Essential hypertension, benign  HLD (hyperlipidemia)  Bleed the patient is dehydrated. I recommend she increase her fluid intake. Otherwise her blood pressures well controlled. I made no changes in her blood pressure medication. Her cholesterol is well-controlled I do believe she needs to increase her aerobic exercise. I recommended she try increasing her walking. Also recommended that she perform stand up and sit down exercises to strengthen her quadriceps as well as her hip extensor muscles. Hoping this would help improve her ambulation as well as her sense of balance and prevent falls. The patient is unable to perform these at home, recommended home physical therapy. She will notify me if she moves to proceed with this. Her diabetes is not well controlled. Therefore I recommend she increase Lantus 75 units twice a day.  Recheck in 3 months

## 2014-04-06 ENCOUNTER — Other Ambulatory Visit: Payer: Self-pay | Admitting: Cardiovascular Disease

## 2014-04-07 NOTE — Telephone Encounter (Signed)
Rx refill sent to patient pharmacy   

## 2014-04-09 ENCOUNTER — Other Ambulatory Visit: Payer: Self-pay | Admitting: Cardiovascular Disease

## 2014-05-22 ENCOUNTER — Encounter: Payer: Self-pay | Admitting: Cardiovascular Disease

## 2014-05-22 ENCOUNTER — Ambulatory Visit (INDEPENDENT_AMBULATORY_CARE_PROVIDER_SITE_OTHER): Payer: Medicare Other | Admitting: Cardiovascular Disease

## 2014-05-22 VITALS — BP 114/56 | HR 89 | Resp 16 | Ht <= 58 in | Wt 230.6 lb

## 2014-05-22 DIAGNOSIS — N183 Chronic kidney disease, stage 3 unspecified: Secondary | ICD-10-CM

## 2014-05-22 DIAGNOSIS — I739 Peripheral vascular disease, unspecified: Secondary | ICD-10-CM

## 2014-05-22 DIAGNOSIS — I2510 Atherosclerotic heart disease of native coronary artery without angina pectoris: Secondary | ICD-10-CM | POA: Diagnosis not present

## 2014-05-22 DIAGNOSIS — I350 Nonrheumatic aortic (valve) stenosis: Secondary | ICD-10-CM

## 2014-05-22 DIAGNOSIS — I480 Paroxysmal atrial fibrillation: Secondary | ICD-10-CM

## 2014-05-22 DIAGNOSIS — N189 Chronic kidney disease, unspecified: Secondary | ICD-10-CM

## 2014-05-22 DIAGNOSIS — Z8673 Personal history of transient ischemic attack (TIA), and cerebral infarction without residual deficits: Secondary | ICD-10-CM | POA: Diagnosis not present

## 2014-05-22 DIAGNOSIS — Z4509 Encounter for adjustment and management of other cardiac device: Secondary | ICD-10-CM | POA: Diagnosis not present

## 2014-05-22 DIAGNOSIS — I10 Essential (primary) hypertension: Secondary | ICD-10-CM

## 2014-05-22 DIAGNOSIS — E1122 Type 2 diabetes mellitus with diabetic chronic kidney disease: Secondary | ICD-10-CM

## 2014-05-22 NOTE — Patient Instructions (Signed)
Your physician has recommended you make the following change in your medication:  Continue your current medications.  Dr. Sallyanne Kuster recommends that you schedule a follow-up appointment in: One Year.

## 2014-05-22 NOTE — Progress Notes (Signed)
Patient ID: [name], female   DOB: May 19, 1946, 68 y.o.   MRN: [id number]     Reason for office visit CAD s/p CABG, paroxysmal atrial fibrillation, loop recorder interrogation  [name] has numerous cardiovascular problems including a history of coronary disease status post bypass surgery in 2011, severe lower extremity peripheral arterial disease with bilateral femoral artery occlusions (Dr. Gwenlyn Found), moderate left renal artery stenosis, diabetes mellitus requiring insulin, hyperlipidemia, hypertension, obstructive sleep apnea and chronic kidney disease (Dr. Posey Pronto). She has moderate left carotid artery stenosis and mild right carotid artery stenosis. She has a remote history of stroke with left visual field cuts. Interrogation of her loop recorder over the last 3 years has shown recurrent episodes of brief atrial tachycardia and long periods of PACs in patterns of bigeminy or trigeminy. There is no convincing evidence of atrial fibrillation. Her device has now reached end of service. Throughout all of 2015 at recorded only one episode of arrhythmia: an 18 second episode of atrial tachycardia at about 166 beats per minute. She leads a very sedentary lifestyle but generally feels well. She is limited by severe lower extremity intermittent claudication, but the pattern of this symptom has not recently changed. She has infrequent episodes of chest pain with activity (bathing, dressing), but these always resolved promptly with rest. She has not required sublingual nitroglycerin. . She denies shortness of breath. Her recent lipid profile was excellent except for mild hypertriglyceridemia, likely related to some deterioration in glycemic control (last hemoglobin A1c had increased to 7.5%).   Allergies  Allergen Reactions  . Latex   . Neosporin [Neomycin-Polymyxin-Gramicidin]   . Sulfa Antibiotics Nausea And Vomiting    Current Outpatient Prescriptions  Medication Sig Dispense Refill  .  acetaminophen (TYLENOL) 325 MG tablet Take 650 mg by mouth every 6 (six) hours as needed.      [name] amLODipine (NORVASC) 10 MG tablet Take 10 mg by mouth daily.        [name] aspirin 81 MG tablet Take 81 mg by mouth 2 (two) times daily.        . B-D ULTRAFINE III SHORT PEN 31G X 8 MM MISC USE AS DIRECTED  100 each  2  . clopidogrel (PLAVIX) 75 MG tablet TAKE 1 TABLET (75 MG TOTAL) BY MOUTH DAILY. <PLEASE SCHEDULE APPOINTMENT FOR FUTURE REFILLS>  30 tablet  1  . CVS VITAMIN D 2000 UNITS CAPS TAKE ONE CAPSULE BY MOUTH DAILY  30 capsule  5  . CVS VITAMIN D 2000 UNITS CAPS TAKE ONE CAPSULE BY MOUTH DAILY  30 capsule  5  . furosemide (LASIX) 40 MG tablet Take 1 tablet (40 mg total) by mouth daily.  30 tablet  6  . hydrALAZINE (APRESOLINE) 25 MG tablet Take 25 mg by mouth as needed. Takes only when BP is above 140/80      . Insulin Glargine (LANTUS SOLOSTAR) 100 UNIT/ML Solostar Pen INJECT 75 UNITS 2 TIMES DAILY      . lisinopril (PRINIVIL,ZESTRIL) 20 MG tablet TAKE 1 TABLET BY MOUTH DAILY  30 tablet  11  . metoCLOPramide (REGLAN) 5 MG tablet TAKE 1 TABLET BY MOUTH 3 TIMES A DAY BEFORE MEALS AND 1 AT BEDTIME  120 tablet  11  . NEXIUM 40 MG capsule TAKE 1 CAPSULE BY MOUTH DAILY  30 capsule  11  . niacin (NIASPAN) 750 MG CR tablet TAKE 1 TABLET BY MOUTH EVERY DAY AT BEDTIME  90 tablet  4  . nitroGLYCERIN (NITROSTAT) 0.4 MG  SL tablet Place 0.4 mg under the tongue every 5 (five) minutes as needed.      . rosuvastatin (CRESTOR) 10 MG tablet Take 1 tablet (10 mg total) by mouth daily.  30 tablet  10   No current facility-administered medications for this visit.    Past Medical History  Diagnosis Date  . Hyperlipidemia   . Hypertension   . Ulcer   . Diabetes mellitus   . Systolic murmur   . Hyperglycemia   . Peripheral artery disease     lower extremities  . CAD (coronary artery disease)     X5  . Obstructive sleep apnea   . Vitamin D deficiency   . Stroke   . Chronic kidney disease   . Atrial  fibrillation     paroxysmal  . Obesity     Past Surgical History  Procedure Laterality Date  . Coronary artery bypass graft  05/22/2010  . Carotid doppler  10/13/2011    right ICA-49% diameter reduction and left ICA with 50%-69% diameter reduction  . Myoview stress  08/13/2010    inferolateral scar without ischemia    No family history on file.  History   Social History  . Marital Status: Single    Spouse Name: N/A    Number of Children: N/A  . Years of Education: N/A   Occupational History  . Not on file.   Social History Main Topics  . Smoking status: Former Smoker    Quit date: 01/22/2010  . Smokeless tobacco: Never Used  . Alcohol Use: No  . Drug Use: No  . Sexual Activity: Not on file   Other Topics Concern  . Not on file   Social History Narrative  . No narrative on file    Review of systems: Infrequent exertional chest pain. She has gait instability if she turns her head while walking and lose his visual contact with the wall due to her field cut The patient specifically denies any chest pain at rest, dyspnea at rest or with exertion, orthopnea, paroxysmal nocturnal dyspnea, syncope, palpitations, new focal neurological deficits, intermittent claudication, lower extremity edema, unexplained weight gain, cough, hemoptysis or wheezing.  The patient also denies abdominal pain, nausea, vomiting, dysphagia, diarrhea, constipation, polyuria, polydipsia, dysuria, hematuria, frequency, urgency, abnormal bleeding or bruising, fever, chills, unexpected weight changes, mood swings, change in skin or hair texture, change in voice quality, auditory or visual problems, allergic reactions or rashes, new musculoskeletal complaints other than usual "aches and pains".   PHYSICAL EXAM BP 114/56  Pulse 89  Resp 16  Ht $R'4\' 10"'ua$  (1.473 m)  Wt 104.599 kg (230 lb 9.6 oz)  BMI 48.21 kg/m2  General: Alert, oriented x3, no distress, morbidly obese Head: no evidence of trauma, PERRL,  EOMI, no exophtalmos or lid lag, no myxedema, no xanthelasma; normal ears, nose and oropharynx Neck: normal jugular venous pulsations and no hepatojugular reflux; brisk carotid pulses without delay and no carotid bruits Chest: clear to auscultation, no signs of consolidation by percussion or palpation, normal fremitus, symmetrical and full respiratory excursions, sternotomy scar Cardiovascular: normal position and quality of the apical impulse, regular rhythm, normal first and second heart sounds, no murmurs, rubs or gallops Abdomen: no tenderness or distention, no masses by palpation, no abnormal pulsatility or arterial bruits, normal bowel sounds, no hepatosplenomegaly Extremities: no clubbing, cyanosis or edema; 2+ radial, ulnar and brachial pulses bilaterally; 2+ right femoral, posterior tibial and dorsalis pedis pulses; 2+ left femoral, posterior tibial and dorsalis pedis  pulses; no subclavian or femoral bruits Neurological: grossly nonfocal   EKG: Normal sinus rhythm, very subtle ST segment nonspecific changes seen especially in the mid precordial leads  Lipid Panel     Component Value Date/Time   CHOL 100 02/23/2014 0925   TRIG 186* 02/23/2014 0925   HDL 29* 02/23/2014 0925   CHOLHDL 3.4 02/23/2014 0925   VLDL 37 02/23/2014 0925   LDLCALC 34 02/23/2014 0925    BMET    Component Value Date/Time   NA 144 02/23/2014 0925   K 4.1 02/23/2014 0925   CL 101 02/23/2014 0925   CO2 29 02/23/2014 0925   GLUCOSE 160* 02/23/2014 0925   BUN 21 02/23/2014 0925   CREATININE 1.48* 02/23/2014 0925   CREATININE 1.65* 09/11/2010 0927   CALCIUM 9.3 02/23/2014 0925   GFRNONAA 36* 02/23/2014 0925   GFRNONAA 31* 09/11/2010 0927   GFRAA 41* 02/23/2014 0925   GFRAA  Value: 38        The eGFR has been calculated using the MDRD equation. This calculation has not been validated in all clinical situations. eGFR's persistently <60 mL/min signify possible Chronic Kidney Disease.* 09/11/2010 2595     ASSESSMENT AND  PLAN CAD (coronary artery disease) CABG x5 2011  CCS class II-III stable angina. Status post CABG x5, Dr. Servando Snare, October 2011, LIMA to LAD, SVG sequential to intermediate and circumflex, SVG sequential to PDA and PLA . Inferolateral scar without ischemia by nuclear stress test 2012. Normal EF by echo December 2013. Told her to call 911 if she has protracted episodes of chest discomfort lasting more than 20-30 minutes and not relieved by 3 sequential sublingual nitroglycerin tablets Paroxysmal atrial fibrillation  Her loop recorder shows infrequent and brief episodes of supraventricular tachycardia and longer episodes of frequent premature atrial contractions, often in bigeminy. Only one episode he has been recorded in 2015 and this was a 16 beat episode of irregular atrial tachycardia. At this point in time, I think she is best served to remain on aspirin and Plavix for the severity of her arterial problems, rather than starting warfarin. She does have a history of stroke. This decision may have to be reviewed in the future. Although she has not had falls, her unsteady gait is also a concern. Peripheral artery disease  She is known to have bilateral superficial femoral artery occlusions in addition she has a high-grade distal left external iliac artery stenosis. On both sides the popliteal arteries reconstitute via collaterals and there is two-vessel runoff with absence of the peroneal arteries. The ABIs are only 0.35 on the right side and 0.3 to the left side. She has very few symptoms related to her legs, likely due to her inactivity.  She has mild to moderate plaque in both carotid arteries and has a 50% left renal artery stenosis.  Obstructive sleep apnea  Mediocre compliance with CPAP  Hypertension  Control is good. 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 Her most recent creatinine is 1.48 Diabetes mellitus  Slight  deterioration in control.  Hyperlipidemia  Her triglycerides remain borderline high and her HDL is low. Excellent LDL. She is on a very complex system of medications. Having fenofibrate added to this may cause more problems than benefit.  Aortic stenosis Very mild by last assessment. Loop recorder at end of service The device is still collecting data and might be useful for "as needed" downloads.    Orders Placed This Encounter  Procedures  .  EKG 12-Lead   Meds ordered this encounter  Medications  . Insulin Glargine (LANTUS SOLOSTAR) 100 UNIT/ML Solostar Pen    Sig: INJECT 75 UNITS 2 TIMES DAILY    Lasheka Kempner  Sanda Klein, MD, [facility] [phone] office [phone] pager

## 2014-05-29 LAB — MDC_IDC_ENUM_SESS_TYPE_INCLINIC
Implantable Pulse Generator Model: 9529
MDC IDC SET ZONE DETECTION INTERVAL: 1500 ms
Zone Setting Detection Interval: 300 ms
Zone Setting Detection Interval: 3000 ms
Zone Setting Detection Interval: 400 ms

## 2014-06-08 ENCOUNTER — Encounter: Payer: Self-pay | Admitting: Cardiology

## 2014-06-12 ENCOUNTER — Other Ambulatory Visit: Payer: Self-pay | Admitting: Cardiovascular Disease

## 2014-06-21 ENCOUNTER — Other Ambulatory Visit: Payer: Medicare Other

## 2014-06-21 DIAGNOSIS — I10 Essential (primary) hypertension: Secondary | ICD-10-CM | POA: Diagnosis not present

## 2014-06-21 DIAGNOSIS — E785 Hyperlipidemia, unspecified: Secondary | ICD-10-CM

## 2014-06-21 DIAGNOSIS — Z79899 Other long term (current) drug therapy: Secondary | ICD-10-CM | POA: Diagnosis not present

## 2014-06-21 DIAGNOSIS — E119 Type 2 diabetes mellitus without complications: Secondary | ICD-10-CM | POA: Diagnosis not present

## 2014-06-21 LAB — CBC WITH DIFFERENTIAL/PLATELET
BASOS ABS: 0 10*3/uL (ref 0.0–0.1)
BASOS PCT: 0 % (ref 0–1)
Eosinophils Absolute: 0.3 10*3/uL (ref 0.0–0.7)
Eosinophils Relative: 3 % (ref 0–5)
HCT: 35.1 % — ABNORMAL LOW (ref 36.0–46.0)
Hemoglobin: 10.7 g/dL — ABNORMAL LOW (ref 12.0–15.0)
Lymphocytes Relative: 19 % (ref 12–46)
Lymphs Abs: 1.9 10*3/uL (ref 0.7–4.0)
MCH: 26.8 pg (ref 26.0–34.0)
MCHC: 30.5 g/dL (ref 30.0–36.0)
MCV: 88 fL (ref 78.0–100.0)
MPV: 10.5 fL (ref 9.4–12.4)
Monocytes Absolute: 0.8 10*3/uL (ref 0.1–1.0)
Monocytes Relative: 8 % (ref 3–12)
Neutro Abs: 7 10*3/uL (ref 1.7–7.7)
Neutrophils Relative %: 70 % (ref 43–77)
PLATELETS: 189 10*3/uL (ref 150–400)
RBC: 3.99 MIL/uL (ref 3.87–5.11)
RDW: 16.3 % — AB (ref 11.5–15.5)
WBC: 10 10*3/uL (ref 4.0–10.5)

## 2014-06-21 LAB — COMPREHENSIVE METABOLIC PANEL
ALBUMIN: 3.6 g/dL (ref 3.5–5.2)
ALK PHOS: 62 U/L (ref 39–117)
ALT: 8 U/L (ref 0–35)
AST: 12 U/L (ref 0–37)
BILIRUBIN TOTAL: 0.2 mg/dL (ref 0.2–1.2)
BUN: 27 mg/dL — AB (ref 6–23)
CO2: 29 mEq/L (ref 19–32)
CREATININE: 1.45 mg/dL — AB (ref 0.50–1.10)
Calcium: 9 mg/dL (ref 8.4–10.5)
Chloride: 102 mEq/L (ref 96–112)
Glucose, Bld: 176 mg/dL — ABNORMAL HIGH (ref 70–99)
POTASSIUM: 4.3 meq/L (ref 3.5–5.3)
Sodium: 143 mEq/L (ref 135–145)
Total Protein: 6.4 g/dL (ref 6.0–8.3)

## 2014-06-21 LAB — HEMOGLOBIN A1C
Hgb A1c MFr Bld: 7.5 % — ABNORMAL HIGH (ref <5.7)
Mean Plasma Glucose: 169 mg/dL — ABNORMAL HIGH (ref <117)

## 2014-06-21 LAB — LIPID PANEL
CHOL/HDL RATIO: 3.6 ratio
CHOLESTEROL: 100 mg/dL (ref 0–200)
HDL: 28 mg/dL — ABNORMAL LOW (ref >39)
LDL Cholesterol: 33 mg/dL (ref 0–99)
TRIGLYCERIDES: 195 mg/dL — AB (ref <150)
VLDL: 39 mg/dL (ref 0–40)

## 2014-06-24 ENCOUNTER — Other Ambulatory Visit: Payer: Self-pay | Admitting: Family Medicine

## 2014-06-27 DIAGNOSIS — I10 Essential (primary) hypertension: Secondary | ICD-10-CM | POA: Diagnosis not present

## 2014-06-27 DIAGNOSIS — N183 Chronic kidney disease, stage 3 (moderate): Secondary | ICD-10-CM | POA: Diagnosis not present

## 2014-06-27 DIAGNOSIS — E889 Metabolic disorder, unspecified: Secondary | ICD-10-CM | POA: Diagnosis not present

## 2014-06-27 DIAGNOSIS — D631 Anemia in chronic kidney disease: Secondary | ICD-10-CM | POA: Diagnosis not present

## 2014-07-01 ENCOUNTER — Other Ambulatory Visit: Payer: Self-pay | Admitting: Cardiovascular Disease

## 2014-07-03 NOTE — Telephone Encounter (Signed)
Rx was sent to pharmacy electronically. 

## 2014-07-06 ENCOUNTER — Ambulatory Visit (INDEPENDENT_AMBULATORY_CARE_PROVIDER_SITE_OTHER): Payer: Medicare Other | Admitting: Family Medicine

## 2014-07-06 ENCOUNTER — Encounter: Payer: Self-pay | Admitting: Family Medicine

## 2014-07-06 VITALS — BP 100/58 | HR 92 | Temp 98.5°F | Resp 20 | Ht <= 58 in | Wt 223.0 lb

## 2014-07-06 DIAGNOSIS — D62 Acute posthemorrhagic anemia: Secondary | ICD-10-CM | POA: Diagnosis not present

## 2014-07-06 DIAGNOSIS — Z794 Long term (current) use of insulin: Secondary | ICD-10-CM

## 2014-07-06 DIAGNOSIS — I2510 Atherosclerotic heart disease of native coronary artery without angina pectoris: Secondary | ICD-10-CM | POA: Diagnosis not present

## 2014-07-06 DIAGNOSIS — E119 Type 2 diabetes mellitus without complications: Secondary | ICD-10-CM

## 2014-07-06 NOTE — Progress Notes (Signed)
Subjective:    Patient ID: [name], female    DOB: [birth year], 68 y.o.   MRN: [id number]  HPI Patient is here today for follow-up of her hypertension, hyperlipidemia, and insulin-dependent diabetes mellitus. She is currently on 75 units of Lantus twice a day. Her hemoglobin A1c remains persistently elevated at 7.5. Her fasting blood sugars are typically in the range of 150-170 although she has frequent episodes greater than 200. She admits that she is constantly grazing at home. She does eat high carbohydrate diet. She is also not exercising with any regularity. Her most recent lab work as listed below: Lab on 06/21/2014  Component Date Value Ref Range Status  . WBC 06/21/2014 10.0  4.0 - 10.5 K/uL Final  . RBC 06/21/2014 3.99  3.87 - 5.11 MIL/uL Final  . Hemoglobin 06/21/2014 10.7* 12.0 - 15.0 g/dL Final  . HCT 06/21/2014 35.1* 36.0 - 46.0 % Final  . MCV 06/21/2014 88.0  78.0 - 100.0 fL Final  . MCH 06/21/2014 26.8  26.0 - 34.0 pg Final  . MCHC 06/21/2014 30.5  30.0 - 36.0 g/dL Final  . RDW 06/21/2014 16.3* 11.5 - 15.5 % Final  . Platelets 06/21/2014 189  150 - 400 K/uL Final  . MPV 06/21/2014 10.5  9.4 - 12.4 fL Final  . Neutrophils Relative % 06/21/2014 70  43 - 77 % Final  . Neutro Abs 06/21/2014 7.0  1.7 - 7.7 K/uL Final  . Lymphocytes Relative 06/21/2014 19  12 - 46 % Final  . Lymphs Abs 06/21/2014 1.9  0.7 - 4.0 K/uL Final  . Monocytes Relative 06/21/2014 8  3 - 12 % Final  . Monocytes Absolute 06/21/2014 0.8  0.1 - 1.0 K/uL Final  . Eosinophils Relative 06/21/2014 3  0 - 5 % Final  . Eosinophils Absolute 06/21/2014 0.3  0.0 - 0.7 K/uL Final  . Basophils Relative 06/21/2014 0  0 - 1 % Final  . Basophils Absolute 06/21/2014 0.0  0.0 - 0.1 K/uL Final  . Smear Review 06/21/2014 Criteria for review not met   Final  . Sodium 06/21/2014 143  135 - 145 mEq/L Final  . Potassium 06/21/2014 4.3  3.5 - 5.3 mEq/L Final  . Chloride 06/21/2014 102  96 - 112 mEq/L Final  . CO2  06/21/2014 29  19 - 32 mEq/L Final  . Glucose, Bld 06/21/2014 176* 70 - 99 mg/dL Final  . BUN 06/21/2014 27* 6 - 23 mg/dL Final  . Creat 06/21/2014 1.45* 0.50 - 1.10 mg/dL Final  . Total Bilirubin 06/21/2014 0.2  0.2 - 1.2 mg/dL Final  . Alkaline Phosphatase 06/21/2014 62  39 - 117 U/L Final  . AST 06/21/2014 12  0 - 37 U/L Final  . ALT 06/21/2014 8  0 - 35 U/L Final  . Total Protein 06/21/2014 6.4  6.0 - 8.3 g/dL Final  . Albumin 06/21/2014 3.6  3.5 - 5.2 g/dL Final  . Calcium 06/21/2014 9.0  8.4 - 10.5 mg/dL Final  . Hgb A1c MFr Bld 06/21/2014 7.5* <5.7 % Final   Comment:                                                                        According  to the ADA Clinical Practice Recommendations for 2011, when HbA1c is used as a screening test:     >=6.5%   Diagnostic of Diabetes Mellitus            (if abnormal result is confirmed)   5.7-6.4%   Increased risk of developing Diabetes Mellitus   References:Diagnosis and Classification of Diabetes Mellitus,Diabetes VCBS,4967,59(FMBWG 1):S62-S69 and Standards of Medical Care in         Diabetes - 2011,Diabetes YKZL,9357,01 (Suppl 1):S11-S61.     . Mean Plasma Glucose 06/21/2014 169* <117 mg/dL Final  . Cholesterol 06/21/2014 100  0 - 200 mg/dL Final   Comment: ATP III Classification:       < 200        mg/dL        Desirable      200 - 239     mg/dL        Borderline High      >= 240        mg/dL        High     . Triglycerides 06/21/2014 195* <150 mg/dL Final  . HDL 06/21/2014 28* >39 mg/dL Final  . Total CHOL/HDL Ratio 06/21/2014 3.6   Final  . VLDL 06/21/2014 39  0 - 40 mg/dL Final  . LDL Cholesterol 06/21/2014 33  0 - 99 mg/dL Final   Comment:   Total Cholesterol/HDL Ratio:CHD Risk                        Coronary Heart Disease Risk Table                                        Men       Women          1/2 Average Risk              3.4        3.3              Average Risk              5.0        4.4           2X Average  Risk              9.6        7.1           3X Average Risk             23.4       11.0 Use the calculated Patient Ratio above and the CHD Risk table  to determine the patient's CHD Risk. ATP III Classification (LDL):       < 100        mg/dL         Optimal      100 - 129     mg/dL         Near or Above Optimal      130 - 159     mg/dL         Borderline High      160 - 189     mg/dL         High       > 190        mg/dL  Very High      Patient's LDL cholesterol is excellent but her HDL cholesterol remains dangerously low. Past Medical History  Diagnosis Date  . Hyperlipidemia   . Hypertension   . Ulcer   . Diabetes mellitus   . Systolic murmur   . Hyperglycemia   . Peripheral artery disease     lower extremities  . CAD (coronary artery disease)     X5  . Obstructive sleep apnea   . Vitamin D deficiency   . Stroke   . Chronic kidney disease   . Atrial fibrillation     paroxysmal  . Obesity    Past Surgical History  Procedure Laterality Date  . Coronary artery bypass graft  05/22/2010  . Carotid doppler  10/13/2011    right ICA-49% diameter reduction and left ICA with 50%-69% diameter reduction  . Myoview stress  08/13/2010    inferolateral scar without ischemia   Current Outpatient Prescriptions on File Prior to Visit  Medication Sig Dispense Refill  . acetaminophen (TYLENOL) 325 MG tablet Take 650 mg by mouth every 6 (six) hours as needed.    [name] amLODipine (NORVASC) 10 MG tablet Take 10 mg by mouth daily.      [name] aspirin 81 MG tablet Take 81 mg by mouth 2 (two) times daily.      . B-D ULTRAFINE III SHORT PEN 31G X 8 MM MISC USE AS DIRECTED 100 each 2  . clopidogrel (PLAVIX) 75 MG tablet Take 1 tablet (75 mg total) by mouth daily. 30 tablet 10  . CVS VITAMIN D 2000 UNITS CAPS TAKE ONE CAPSULE BY MOUTH DAILY 30 capsule 5  . furosemide (LASIX) 40 MG tablet Take 1 tablet (40 mg total) by mouth daily. 30 tablet 10  . hydrALAZINE (APRESOLINE) 25 MG tablet Take 25 mg  by mouth as needed. Takes only when BP is above 140/80    . Insulin Glargine (LANTUS SOLOSTAR) 100 UNIT/ML Solostar Pen INJECT 75 UNITS 2 TIMES DAILY    . lisinopril (PRINIVIL,ZESTRIL) 20 MG tablet TAKE 1 TABLET BY MOUTH DAILY 30 tablet 11  . metoCLOPramide (REGLAN) 5 MG tablet TAKE 1 TABLET BY MOUTH 3 TIMES A DAY BEFORE MEALS AND 1 AT BEDTIME 120 tablet 11  . NEXIUM 40 MG capsule TAKE 1 CAPSULE BY MOUTH DAILY 30 capsule 11  . niacin (NIASPAN) 750 MG CR tablet TAKE 1 TABLET BY MOUTH EVERY DAY AT BEDTIME 90 tablet 4  . nitroGLYCERIN (NITROSTAT) 0.4 MG SL tablet Place 0.4 mg under the tongue every 5 (five) minutes as needed.    . rosuvastatin (CRESTOR) 10 MG tablet Take 1 tablet (10 mg total) by mouth daily. 30 tablet 10   No current facility-administered medications on file prior to visit.   Allergies  Allergen Reactions  . Latex   . Neosporin [Neomycin-Polymyxin-Gramicidin]   . Sulfa Antibiotics Nausea And Vomiting   History   Social History  . Marital Status: Single    Spouse Name: N/A    Number of Children: N/A  . Years of Education: N/A   Occupational History  . Not on file.   Social History Main Topics  . Smoking status: Former Smoker    Quit date: 01/22/2010  . Smokeless tobacco: Never Used  . Alcohol Use: No  . Drug Use: No  . Sexual Activity: Not on file   Other Topics Concern  . Not on file   Social History Narrative      Review of Systems  All  other systems reviewed and are negative.      Objective:   Physical Exam  Constitutional: She appears well-developed and well-nourished.  Cardiovascular: Normal rate, regular rhythm and normal heart sounds.   No murmur heard. Pulmonary/Chest: Effort normal and breath sounds normal. No respiratory distress. She has no wheezes. She has no rales.  Abdominal: Soft. Bowel sounds are normal. She exhibits no distension. There is no tenderness. There is no rebound.  Musculoskeletal: She exhibits no edema.  Vitals  reviewed.         Assessment & Plan:  Acute blood loss anemia - Plan: POC Hemoccult Bld/Stl (3-Cd Home Screen)  Diabetes mellitus, type II, insulin dependent  I'm concerned because the patient's hemoglobin continues to trend down. It is now down to 10.7. She has never had a colonoscopy and is afraid to have a colonoscopy. I believe the most likely cause of the anemia of chronic disease however I will send the patient home with stool cards 3 to rule out an occult GI bleed. Hemoglobin A1c is elevated. Her goal hemoglobin A1c is less than 7. I recommended he increase Lantus to 80 units twice a day and I recommended that she also discontinued her grazing. We had a 20 minute discussion regarding a low carbohydrate diet. Also recommended that she increase her aerobic exercise. Hopefully by increasing her aerobic exercise to raise her HDL cholesterol. Recheck in 3 months. Her blood pressures acceptable. Her immunizations are up-to-date.

## 2014-07-10 ENCOUNTER — Other Ambulatory Visit: Payer: Self-pay | Admitting: Family Medicine

## 2014-07-10 ENCOUNTER — Other Ambulatory Visit: Payer: Medicare Other

## 2014-07-10 DIAGNOSIS — D649 Anemia, unspecified: Secondary | ICD-10-CM

## 2014-07-11 LAB — FECAL OCCULT BLOOD, IMMUNOCHEMICAL
FECAL OCCULT BLOOD: POSITIVE — AB
FECAL OCCULT BLOOD: POSITIVE — AB
Fecal Occult Blood: POSITIVE — AB

## 2014-07-13 ENCOUNTER — Other Ambulatory Visit: Payer: Self-pay | Admitting: *Deleted

## 2014-07-13 ENCOUNTER — Encounter: Payer: Self-pay | Admitting: Gastroenterology

## 2014-07-13 DIAGNOSIS — R195 Other fecal abnormalities: Secondary | ICD-10-CM

## 2014-09-05 ENCOUNTER — Telehealth: Payer: Self-pay

## 2014-09-05 ENCOUNTER — Ambulatory Visit (INDEPENDENT_AMBULATORY_CARE_PROVIDER_SITE_OTHER): Payer: Medicare Other | Admitting: Gastroenterology

## 2014-09-05 ENCOUNTER — Encounter: Payer: Self-pay | Admitting: Gastroenterology

## 2014-09-05 VITALS — BP 124/50 | HR 88 | Ht 59.25 in | Wt 227.1 lb

## 2014-09-05 DIAGNOSIS — R195 Other fecal abnormalities: Secondary | ICD-10-CM | POA: Diagnosis not present

## 2014-09-05 MED ORDER — MOVIPREP 100 G PO SOLR: 1.0000 | Freq: Once | ORAL | Status: DC

## 2014-09-05 NOTE — Telephone Encounter (Signed)
Pt has been notified.

## 2014-09-05 NOTE — Progress Notes (Signed)
HPI: This is a  very pleasant, morbidly obese 69 year old woman whom I am meeting for the first time today. She was here with a friend of hers. He seems to also act as a partial caregiver.  Cbc 06/2014 show Hb 10.7 (3 years ago was normal) FOBT testing 3 sets in past month have all been +  Never sees blood in her stools.    No colon cancer in family.  Has diarrhea intermittently.  Feels bloated at times.  Overall stable weight.  She believes she had a colonoscopy many years ago, 10 years ago.  She understands this was normal, no polyps  history of coronary disease status post bypass surgery in 2011, severe lower extremity peripheral arterial disease with bilateral femoral artery occlusions (Dr. Gwenlyn Found), moderate left renal artery stenosis, diabetes mellitus requiring insulin, hyperlipidemia, hypertension, obstructive sleep apnea and chronic kidney disease (Dr. Posey Pronto). She has moderate left carotid artery stenosis and mild right carotid artery stenosis. She has a remote history of stroke with left visual field cuts.    Review of systems: Pertinent positive and negative review of systems were noted in the above HPI section. Complete review of systems was performed and was otherwise normal.    Past Medical History  Diagnosis Date  . Hyperlipidemia   . Hypertension   . Ulcer   . Diabetes mellitus   . Systolic murmur   . Hyperglycemia   . Peripheral artery disease     lower extremities  . CAD (coronary artery disease)     X5  . Obstructive sleep apnea   . Vitamin D deficiency   . Stroke   . Chronic kidney disease   . Atrial fibrillation     paroxysmal  . Obesity   . GI bleeding     Past Surgical History  Procedure Laterality Date  . Coronary artery bypass graft  05/22/2010  . Carotid doppler  10/13/2011    right ICA-49% diameter reduction and left ICA with 50%-69% diameter reduction  . Myoview stress  08/13/2010    inferolateral scar without ischemia  . Angioplasty /  stenting femoral Right     leg  . Heart bypass      Current Outpatient Prescriptions  Medication Sig Dispense Refill  . acetaminophen (TYLENOL) 325 MG tablet Take 650 mg by mouth every 6 (six) hours as needed.    [name] amLODipine (NORVASC) 10 MG tablet Take 10 mg by mouth daily.      [name] aspirin 81 MG tablet Take 81 mg by mouth daily.     . B-D ULTRAFINE III SHORT PEN 31G X 8 MM MISC USE AS DIRECTED 100 each 2  . clopidogrel (PLAVIX) 75 MG tablet Take 1 tablet (75 mg total) by mouth daily. 30 tablet 10  . CVS VITAMIN D 2000 UNITS CAPS TAKE ONE CAPSULE BY MOUTH DAILY 30 capsule 5  . furosemide (LASIX) 40 MG tablet Take 1 tablet (40 mg total) by mouth daily. 30 tablet 10  . hydrALAZINE (APRESOLINE) 25 MG tablet Take 25 mg by mouth as needed. Takes only when BP is above 140/80    . Insulin Glargine (LANTUS SOLOSTAR) 100 UNIT/ML Solostar Pen INJECT 75 UNITS 2 TIMES DAILY    . lisinopril (PRINIVIL,ZESTRIL) 20 MG tablet TAKE 1 TABLET BY MOUTH DAILY 30 tablet 11  . metoCLOPramide (REGLAN) 5 MG tablet TAKE 1 TABLET BY MOUTH 3 TIMES A DAY BEFORE MEALS AND 1 AT BEDTIME 120 tablet 11  . NEXIUM 40 MG capsule TAKE 1  CAPSULE BY MOUTH DAILY 30 capsule 11  . niacin (NIASPAN) 750 MG CR tablet TAKE 1 TABLET BY MOUTH EVERY DAY AT BEDTIME 90 tablet 4  . nitroGLYCERIN (NITROSTAT) 0.4 MG SL tablet Place 0.4 mg under the tongue every 5 (five) minutes as needed.    . Omega-3 Fatty Acids (FISH OIL) 1200 MG CPDR Take 2 tablets by mouth at bedtime.    . rosuvastatin (CRESTOR) 10 MG tablet Take 1 tablet (10 mg total) by mouth daily. 30 tablet 10   No current facility-administered medications for this visit.    Allergies as of 09/05/2014 - Review Complete 09/05/2014  Allergen Reaction Noted  . Latex  02/04/2011  . Neosporin [neomycin-polymyxin-gramicidin]  02/04/2011  . Sulfa antibiotics Nausea And Vomiting 02/10/2013    Family History  Problem Relation Age of Onset  . Cancer Mother     Did not know what kind  .  COPD Brother   . Diabetes Sister   . Heart disease Brother   . Colon cancer Neg Hx   . Colon polyps Neg Hx   . Gallbladder disease Neg Hx   . Kidney disease Neg Hx   . Esophageal cancer Neg Hx     History   Social History  . Marital Status: Single    Spouse Name: N/A    Number of Children: 0  . Years of Education: N/A   Occupational History  . Retired    Social History Main Topics  . Smoking status: Former Smoker    Quit date: 01/22/2010  . Smokeless tobacco: Never Used  . Alcohol Use: No  . Drug Use: No  . Sexual Activity: Not on file   Other Topics Concern  . Not on file   Social History Narrative       Physical Exam: BP 124/50 mmHg  Pulse 88  Ht 4' 11.25" (1.505 m)  Wt 227 lb 2 oz (103.023 kg)  BMI 45.48 kg/m2 Constitutional: Morbidly obese otherwise fairly well appearing Psychiatric: alert and oriented x3 Eyes: extraocular movements intact Mouth: oral pharynx moist, no lesions Neck: supple no lymphadenopathy Cardiovascular: heart regular rate and rhythm Lungs: clear to auscultation bilaterally Abdomen: soft, nontender, nondistended, no obvious ascites, no peritoneal signs, normal bowel sounds Extremities: no lower extremity edema bilaterally Skin: no lesions on visible extremities    Assessment and plan: 69 y.o. female with morbid obesity, heart disease, normocytic anemia with Hemoccult positive stools, she takes Plavix daily  She has microscopic blood in her stool and she is slightly anemic.  She believes she had a colonoscopy 10-15 years ago and understood that that was normal. Given her Hemoccult positive stool and her anemia now I recommended we proceed with repeat colonoscopy and if nothing is found on: To clearly explain her eyelid at the same time proceed with EGD. She takes Plavix daily for cardiac reasons and we will communicate with her cardiologist for safety of her holding the Plavix for 5 days prior to the procedure.

## 2014-09-05 NOTE — Telephone Encounter (Signed)
No problem temporarily holding Plavix for 5-7 days before colonoscopy.         Sanda Klein, MD, [facility]    [facility]    [phone] office    [phone] pager        ----- Message -----     From: Barron Alvine, CMA     Sent: 09/05/2014  9:47 AM      To: Sanda Klein, MD

## 2014-09-05 NOTE — Patient Instructions (Addendum)
We will contact your cardiologist about the safety of holding your plavix for 5 days prior to colonoscopy. You will be set up for a colonoscopy (heme positive anemia). You will be set up for an upper endoscopy for hemocult positive anemia.

## 2014-09-19 ENCOUNTER — Other Ambulatory Visit: Payer: Self-pay | Admitting: Family Medicine

## 2014-09-25 ENCOUNTER — Other Ambulatory Visit: Payer: Medicare Other

## 2014-09-25 DIAGNOSIS — I10 Essential (primary) hypertension: Secondary | ICD-10-CM

## 2014-09-25 DIAGNOSIS — N183 Chronic kidney disease, stage 3 unspecified: Secondary | ICD-10-CM

## 2014-09-25 DIAGNOSIS — E119 Type 2 diabetes mellitus without complications: Secondary | ICD-10-CM | POA: Diagnosis not present

## 2014-09-25 DIAGNOSIS — I2510 Atherosclerotic heart disease of native coronary artery without angina pectoris: Secondary | ICD-10-CM

## 2014-09-25 DIAGNOSIS — E785 Hyperlipidemia, unspecified: Secondary | ICD-10-CM | POA: Diagnosis not present

## 2014-09-25 LAB — COMPLETE METABOLIC PANEL WITH GFR
ALT: 9 U/L (ref 0–35)
AST: 12 U/L (ref 0–37)
Albumin: 3.5 g/dL (ref 3.5–5.2)
Alkaline Phosphatase: 62 U/L (ref 39–117)
BUN: 24 mg/dL — ABNORMAL HIGH (ref 6–23)
CO2: 26 mEq/L (ref 19–32)
Calcium: 9 mg/dL (ref 8.4–10.5)
Chloride: 104 mEq/L (ref 96–112)
Creat: 1.32 mg/dL — ABNORMAL HIGH (ref 0.50–1.10)
GFR, EST AFRICAN AMERICAN: 47 mL/min — AB
GFR, Est Non African American: 41 mL/min — ABNORMAL LOW
Glucose, Bld: 137 mg/dL — ABNORMAL HIGH (ref 70–99)
POTASSIUM: 3.8 meq/L (ref 3.5–5.3)
Sodium: 146 mEq/L — ABNORMAL HIGH (ref 135–145)
Total Bilirubin: 0.2 mg/dL (ref 0.2–1.2)
Total Protein: 6.3 g/dL (ref 6.0–8.3)

## 2014-09-25 LAB — LIPID PANEL
Cholesterol: 103 mg/dL (ref 0–200)
HDL: 28 mg/dL — ABNORMAL LOW (ref >=46)
LDL CALC: 47 mg/dL (ref 0–99)
TRIGLYCERIDES: 141 mg/dL (ref <150)
Total CHOL/HDL Ratio: 3.7 Ratio
VLDL: 28 mg/dL (ref 0–40)

## 2014-09-25 LAB — CBC WITH DIFFERENTIAL/PLATELET
BASOS ABS: 0.1 10*3/uL (ref 0.0–0.1)
Basophils Relative: 1 % (ref 0–1)
Eosinophils Absolute: 0.2 10*3/uL (ref 0.0–0.7)
Eosinophils Relative: 2 % (ref 0–5)
HCT: 34.9 % — ABNORMAL LOW (ref 36.0–46.0)
Hemoglobin: 10.8 g/dL — ABNORMAL LOW (ref 12.0–15.0)
LYMPHS ABS: 1.4 10*3/uL (ref 0.7–4.0)
Lymphocytes Relative: 17 % (ref 12–46)
MCH: 27.3 pg (ref 26.0–34.0)
MCHC: 30.9 g/dL (ref 30.0–36.0)
MCV: 88.4 fL (ref 78.0–100.0)
MONOS PCT: 9 % (ref 3–12)
MPV: 10.3 fL (ref 8.6–12.4)
Monocytes Absolute: 0.8 10*3/uL (ref 0.1–1.0)
Neutro Abs: 6 10*3/uL (ref 1.7–7.7)
Neutrophils Relative %: 71 % (ref 43–77)
Platelets: 169 10*3/uL (ref 150–400)
RBC: 3.95 MIL/uL (ref 3.87–5.11)
RDW: 16.3 % — ABNORMAL HIGH (ref 11.5–15.5)
WBC: 8.5 10*3/uL (ref 4.0–10.5)

## 2014-09-25 LAB — HEMOGLOBIN A1C
Hgb A1c MFr Bld: 7.8 % — ABNORMAL HIGH (ref <5.7)
Mean Plasma Glucose: 177 mg/dL — ABNORMAL HIGH (ref <117)

## 2014-10-03 ENCOUNTER — Encounter: Payer: Self-pay | Admitting: Gastroenterology

## 2014-10-03 ENCOUNTER — Ambulatory Visit (AMBULATORY_SURGERY_CENTER): Payer: Medicare Other | Admitting: Gastroenterology

## 2014-10-03 VITALS — BP 93/46 | HR 86 | Temp 98.8°F | Resp 17 | Ht 59.25 in | Wt 227.0 lb

## 2014-10-03 DIAGNOSIS — K2950 Unspecified chronic gastritis without bleeding: Secondary | ICD-10-CM | POA: Diagnosis not present

## 2014-10-03 DIAGNOSIS — K2990 Gastroduodenitis, unspecified, without bleeding: Secondary | ICD-10-CM | POA: Diagnosis not present

## 2014-10-03 DIAGNOSIS — I10 Essential (primary) hypertension: Secondary | ICD-10-CM | POA: Diagnosis not present

## 2014-10-03 DIAGNOSIS — N189 Chronic kidney disease, unspecified: Secondary | ICD-10-CM | POA: Diagnosis not present

## 2014-10-03 DIAGNOSIS — K2970 Gastritis, unspecified, without bleeding: Secondary | ICD-10-CM

## 2014-10-03 DIAGNOSIS — I4891 Unspecified atrial fibrillation: Secondary | ICD-10-CM | POA: Diagnosis not present

## 2014-10-03 DIAGNOSIS — I2510 Atherosclerotic heart disease of native coronary artery without angina pectoris: Secondary | ICD-10-CM | POA: Diagnosis not present

## 2014-10-03 DIAGNOSIS — D122 Benign neoplasm of ascending colon: Secondary | ICD-10-CM

## 2014-10-03 DIAGNOSIS — D649 Anemia, unspecified: Secondary | ICD-10-CM | POA: Diagnosis not present

## 2014-10-03 DIAGNOSIS — R195 Other fecal abnormalities: Secondary | ICD-10-CM | POA: Diagnosis not present

## 2014-10-03 DIAGNOSIS — G4733 Obstructive sleep apnea (adult) (pediatric): Secondary | ICD-10-CM | POA: Diagnosis not present

## 2014-10-03 DIAGNOSIS — I739 Peripheral vascular disease, unspecified: Secondary | ICD-10-CM | POA: Diagnosis not present

## 2014-10-03 DIAGNOSIS — Z8673 Personal history of transient ischemic attack (TIA), and cerebral infarction without residual deficits: Secondary | ICD-10-CM | POA: Diagnosis not present

## 2014-10-03 DIAGNOSIS — D120 Benign neoplasm of cecum: Secondary | ICD-10-CM

## 2014-10-03 DIAGNOSIS — D123 Benign neoplasm of transverse colon: Secondary | ICD-10-CM | POA: Diagnosis not present

## 2014-10-03 DIAGNOSIS — E119 Type 2 diabetes mellitus without complications: Secondary | ICD-10-CM | POA: Diagnosis not present

## 2014-10-03 LAB — GLUCOSE, CAPILLARY
GLUCOSE-CAPILLARY: 123 mg/dL — AB (ref 70–99)
Glucose-Capillary: 145 mg/dL — ABNORMAL HIGH (ref 70–99)

## 2014-10-03 MED ORDER — SODIUM CHLORIDE 0.9 % IV SOLN: 500.0000 mL | INTRAVENOUS | Status: DC

## 2014-10-03 NOTE — Op Note (Signed)
[place]  [address]. [place], [id number]   COLONOSCOPY PROCEDURE REPORT  PATIENT: Callen, Zuba  MR#: [id number] BIRTHDATE: [birth year] , 68  yrs. old GENDER: female ENDOSCOPIST: Milus Banister, MD REFERRED HU:TMLYYT Dennard Schaumann, M.D. PROCEDURE DATE:  10/03/2014 PROCEDURE:   Colonoscopy with snare polypectomy and Submucosal injection, any substance First Screening Colonoscopy - Avg.  risk and is 50 yrs.  old or older - No.  Prior Negative Screening - Now for repeat screening. N/A  History of Adenoma - Now for follow-up colonoscopy & has been > or = to 3 yrs.  N/A  Polyps Removed Today? Yes. ASA CLASS:   Class III INDICATIONS:hemocult + anemia (normocytic). MEDICATIONS: Monitored anesthesia care, Propofol 250 mg IV, and lidocaine 200mg  IV, ephedrine 20mg  IV  DESCRIPTION OF PROCEDURE:   After the risks benefits and alternatives of the procedure were thoroughly explained, informed consent was obtained.  The digital rectal exam revealed no abnormalities of the rectum.   The [id number]  endoscope was introduced through the anus and advanced to the cecum, which was identified by both the appendix and ileocecal valve. No adverse events experienced.   The quality of the prep was good.  The instrument was then slowly withdrawn as the colon was fully examined.      COLON FINDINGS: Seven polyps were found, removed and sent to pathology.  6 of these were sessile, 3-74mm across, located in cecum, ascending, transverse segment, all removed with cold snare (jar 1).  The last polyp was 2cm across, sessile, located in proximal transverse colon, removed in piecemeal fashion with snare/cautery and cold snare technique (jar 2).  This site was labeled with injection of Spot following polyp removal. The examination was otherwise normal.  Retroflexed views revealed no abnormalities. The time to cecum=2 minutes 42 seconds.  Withdrawal time=21 minutes .36 seconds.   The scope was withdrawn and the procedure completed. COMPLICATIONS: There were no immediate complications.  ENDOSCOPIC IMPRESSION: Seven polyps were found, removed and sent to pathology. The examination was otherwise normal  RECOMMENDATIONS: 1. If the polyp(s) removed today are proven to be adenomatous (pre-cancerous) polyps, you will need a colonoscopy in 3 years. Otherwise you should continue to follow colorectal cancer screening guidelines for "routine risk" patients with a colonoscopy in 10 years.  You will receive a letter within 1-2 weeks with the results of your biopsy as well as final recommendations.  Please call my office if you have not received a letter after 3 weeks. 2. Restart your plavix tomorrow.  eSigned:  Milus Banister, MD 10/03/2014 3:10 PM      PATIENT NAME:  Arisa, [place]leton MR#: [id number]

## 2014-10-03 NOTE — Patient Instructions (Signed)
RESTART YOUR PLAVIX TOMORROW.   YOU HAD AN ENDOSCOPIC PROCEDURE TODAY AT [facility]:   Refer to the procedure report that was given to you for any specific questions about what was found during the examination.  If the procedure report does not answer your questions, please call your gastroenterologist to clarify.  If you requested that your care partner not be given the details of your procedure findings, then the procedure report has been included in a sealed envelope for you to review at your convenience later.  YOU SHOULD EXPECT: Some feelings of bloating in the abdomen. Passage of more gas than usual.  Walking can help get rid of the air that was put into your GI tract during the procedure and reduce the bloating. If you had a lower endoscopy (such as a colonoscopy or flexible sigmoidoscopy) you may notice spotting of blood in your stool or on the toilet paper. If you underwent a bowel prep for your procedure, you may not have a normal bowel movement for a few days.  Please Note:  You might notice some irritation and congestion in your nose or some drainage.  This is from the oxygen used during your procedure.  There is no need for concern and it should clear up in a day or so.  SYMPTOMS TO REPORT IMMEDIATELY:   Following lower endoscopy (colonoscopy or flexible sigmoidoscopy):  Excessive amounts of blood in the stool  Significant tenderness or worsening of abdominal pains  Swelling of the abdomen that is new, acute  Fever of 100F or higher   Following upper endoscopy (EGD)  Vomiting of blood or coffee ground material  New chest pain or pain under the shoulder blades  Painful or persistently difficult swallowing  New shortness of breath  Fever of 100F or higher  Black, tarry-looking stools  A gastroenterologist can be reached at any hour by calling [phone].   DIET: Your first meal following the procedure should be a small meal and then it is ok to  progress to your normal diet. Heavy or fried foods are harder to digest and may make you feel nauseous or bloated.  Likewise, meals heavy in dairy and vegetables can increase bloating.  Drink plenty of fluids but you should avoid alcoholic beverages for 24 hours.  ACTIVITY:  You should plan to take it easy for the rest of today and you should NOT DRIVE or use heavy machinery until tomorrow (because of the sedation medicines used during the test).    FOLLOW UP: Our staff will call the number listed on your records the next business day following your procedure to check on you and address any questions or concerns that you may have regarding the information given to you following your procedure. If we do not reach you, we will leave a message.  However, if you are feeling well and you are not experiencing any problems, there is no need to return our call.  We will assume that you have returned to your regular daily activities without incident.  If any biopsies were taken you will be contacted by phone or by letter within the next 1-3 weeks.  Please call us at [phone] if you have not heard about the biopsies in 3 weeks.    SIGNATURES/CONFIDENTIALITY: You and/or your care partner have signed paperwork which will be entered into your electronic medical record.  These signatures attest to the fact that that the information above on your After Visit Summary has been  reviewed and is understood.  Full responsibility of the confidentiality of this discharge information lies with you and/or your care-partner.

## 2014-10-03 NOTE — Progress Notes (Signed)
Called to room to assist during endoscopic procedure.  Patient ID and intended procedure confirmed with present staff. Received instructions for my participation in the procedure from the performing physician.  

## 2014-10-03 NOTE — Op Note (Signed)
[place]  [address]. [place], [id number]   ENDOSCOPY PROCEDURE REPORT  PATIENT: Diane, [name]  MR#: [id number] BIRTHDATE: 1946-04-06 , 68  yrs. old GENDER: female ENDOSCOPIST: Milus Banister, MD PROCEDURE DATE:  10/03/2014 PROCEDURE:  EGD w/ biopsy ASA CLASS:     Class III INDICATIONS:  heme + anemia (normocytic). MEDICATIONS: Monitored anesthesia care, Propofol 150 mg IV, and ephedrine 20mg  IV TOPICAL ANESTHETIC: none  DESCRIPTION OF PROCEDURE: After the risks benefits and alternatives of the procedure were thoroughly explained, informed consent was obtained.  The LB [id number] endoscope was introduced through the mouth and advanced to the second portion of the duodenum , Without limitations.  The instrument was slowly withdrawn as the mucosa was fully examined.    Moderate pan-gastritis was found, biopsied in the distal stomach. There was a 2cm hiatal hernia.  The examination was otherwise normal.  Retroflexed views revealed no abnormalities.     The scope was then withdrawn from the patient and the procedure completed.  COMPLICATIONS: There were no immediate complications.  ENDOSCOPIC IMPRESSION: Moderate pan-gastritis was found, biopsied in the distal stomach. There was a 2cm hiatal hernia.  The examination was otherwise normal  RECOMMENDATIONS: Await final pathology.   eSigned:  Milus Banister, MD 10/03/2014 3:13 PM    CC: Jenna Luo, MD

## 2014-10-03 NOTE — Progress Notes (Signed)
Report to PACU, RN, vss, BBS= Clear.  

## 2014-10-04 ENCOUNTER — Telehealth: Payer: Self-pay | Admitting: *Deleted

## 2014-10-04 NOTE — Telephone Encounter (Signed)
  Follow up Call-  Call back number 10/03/2014  Post procedure Call Back phone  # [phone]  Permission to leave phone message Yes     Patient questions:  Do you have a fever, pain , or abdominal swelling? No. Pain Score  0 *  Have you tolerated food without any problems? Yes.    Have you been able to return to your normal activities? Yes.    Do you have any questions about your discharge instructions: Diet   No. Medications  No. Follow up visit  No.  Do you have questions or concerns about your Care? No.  Actions: * If pain score is 4 or above: No action needed, pain <4.

## 2014-10-05 ENCOUNTER — Encounter: Payer: Self-pay | Admitting: Family Medicine

## 2014-10-09 ENCOUNTER — Ambulatory Visit (INDEPENDENT_AMBULATORY_CARE_PROVIDER_SITE_OTHER): Payer: Medicare Other | Admitting: Family Medicine

## 2014-10-09 ENCOUNTER — Encounter: Payer: Self-pay | Admitting: Family Medicine

## 2014-10-09 VITALS — BP 128/50 | HR 100 | Temp 98.1°F | Resp 20 | Ht <= 58 in | Wt 229.0 lb

## 2014-10-09 DIAGNOSIS — E785 Hyperlipidemia, unspecified: Secondary | ICD-10-CM | POA: Diagnosis not present

## 2014-10-09 DIAGNOSIS — I10 Essential (primary) hypertension: Secondary | ICD-10-CM

## 2014-10-09 DIAGNOSIS — E119 Type 2 diabetes mellitus without complications: Secondary | ICD-10-CM

## 2014-10-09 DIAGNOSIS — I2510 Atherosclerotic heart disease of native coronary artery without angina pectoris: Secondary | ICD-10-CM | POA: Diagnosis not present

## 2014-10-09 DIAGNOSIS — Z794 Long term (current) use of insulin: Secondary | ICD-10-CM

## 2014-10-09 MED ORDER — INSULIN GLARGINE 100 UNIT/ML SOLOSTAR PEN: PEN_INJECTOR | SUBCUTANEOUS | Status: DC

## 2014-10-09 NOTE — Progress Notes (Signed)
Subjective:    Patient ID: [name], female    DOB: [birth year], 69 y.o.   MRN: [id number]  HPI Patient is here today for follow-up of her multiple medical condition. She has a history of coronary artery disease status post 5 vessel CABG in 2011. She also has chronic kidney disease stage III, history of stroke, peripheral artery disease, hypertension and hyperlipidemia. She denies any chest pain shortness of breath or dyspnea on exertion. Her blood pressures well controlled at 128/50. She denies any right upper quadrant pain or myalgias on Crestor 10 mg by mouth daily. Her most recent lab work as listed below: Procedure visit on 10/03/2014  Component Date Value Ref Range Status  . Glucose-Capillary 10/03/2014 145* 70 - 99 mg/dL Final  . Glucose-Capillary 10/03/2014 123* 70 - 99 mg/dL Final  Lab on 09/25/2014  Component Date Value Ref Range Status  . WBC 09/25/2014 8.5  4.0 - 10.5 K/uL Final  . RBC 09/25/2014 3.95  3.87 - 5.11 MIL/uL Final  . Hemoglobin 09/25/2014 10.8* 12.0 - 15.0 g/dL Final  . HCT 09/25/2014 34.9* 36.0 - 46.0 % Final  . MCV 09/25/2014 88.4  78.0 - 100.0 fL Final  . MCH 09/25/2014 27.3  26.0 - 34.0 pg Final  . MCHC 09/25/2014 30.9  30.0 - 36.0 g/dL Final  . RDW 09/25/2014 16.3* 11.5 - 15.5 % Final  . Platelets 09/25/2014 169  150 - 400 K/uL Final  . MPV 09/25/2014 10.3  8.6 - 12.4 fL Final  . Neutrophils Relative % 09/25/2014 71  43 - 77 % Final  . Neutro Abs 09/25/2014 6.0  1.7 - 7.7 K/uL Final  . Lymphocytes Relative 09/25/2014 17  12 - 46 % Final  . Lymphs Abs 09/25/2014 1.4  0.7 - 4.0 K/uL Final  . Monocytes Relative 09/25/2014 9  3 - 12 % Final  . Monocytes Absolute 09/25/2014 0.8  0.1 - 1.0 K/uL Final  . Eosinophils Relative 09/25/2014 2  0 - 5 % Final  . Eosinophils Absolute 09/25/2014 0.2  0.0 - 0.7 K/uL Final  . Basophils Relative 09/25/2014 1  0 - 1 % Final  . Basophils Absolute 09/25/2014 0.1  0.0 - 0.1 K/uL Final  . Smear Review 09/25/2014  Criteria for review not met   Final  . Cholesterol 09/25/2014 103  0 - 200 mg/dL Final   Comment: ATP III Classification:       < 200        mg/dL        Desirable      200 - 239     mg/dL        Borderline High      >= 240        mg/dL        High     . Triglycerides 09/25/2014 141  <150 mg/dL Final  . HDL 09/25/2014 28* >=46 mg/dL Final   ** Please note change in reference range(s). **  . Total CHOL/HDL Ratio 09/25/2014 3.7   Final  . VLDL 09/25/2014 28  0 - 40 mg/dL Final  . LDL Cholesterol 09/25/2014 47  0 - 99 mg/dL Final   Comment:   Total Cholesterol/HDL Ratio:CHD Risk                        Coronary Heart Disease Risk Table  Men       Women          1/2 Average Risk              3.4        3.3              Average Risk              5.0        4.4           2X Average Risk              9.6        7.1           3X Average Risk             23.4       11.0 Use the calculated Patient Ratio above and the CHD Risk table  to determine the patient's CHD Risk. ATP III Classification (LDL):       < 100        mg/dL         Optimal      100 - 129     mg/dL         Near or Above Optimal      130 - 159     mg/dL         Borderline High      160 - 189     mg/dL         High       > 190        mg/dL         Very High     . Hgb A1c MFr Bld 09/25/2014 7.8* <5.7 % Final   Comment:                                                                        According to the ADA Clinical Practice Recommendations for 2011, when HbA1c is used as a screening test:     >=6.5%   Diagnostic of Diabetes Mellitus            (if abnormal result is confirmed)   5.7-6.4%   Increased risk of developing Diabetes Mellitus   References:Diagnosis and Classification of Diabetes Mellitus,Diabetes TXHF,4142,39(RVUYE 1):S62-S69 and Standards of Medical Care in         Diabetes - 2011,Diabetes BXID,5686,16 (Suppl 1):S11-S61.     . Mean Plasma Glucose 09/25/2014 177* <117  mg/dL Final  . Sodium 09/25/2014 146* 135 - 145 mEq/L Final  . Potassium 09/25/2014 3.8  3.5 - 5.3 mEq/L Final  . Chloride 09/25/2014 104  96 - 112 mEq/L Final  . CO2 09/25/2014 26  19 - 32 mEq/L Final  . Glucose, Bld 09/25/2014 137* 70 - 99 mg/dL Final  . BUN 09/25/2014 24* 6 - 23 mg/dL Final  . Creat 09/25/2014 1.32* 0.50 - 1.10 mg/dL Final  . Total Bilirubin 09/25/2014 0.2  0.2 - 1.2 mg/dL Final  . Alkaline Phosphatase 09/25/2014 62  39 - 117 U/L Final  . AST 09/25/2014 12  0 - 37 U/L Final  . ALT 09/25/2014 9  0 - 35 U/L Final  . Total  Protein 09/25/2014 6.3  6.0 - 8.3 g/dL Final  . Albumin 09/25/2014 3.5  3.5 - 5.2 g/dL Final  . Calcium 09/25/2014 9.0  8.4 - 10.5 mg/dL Final  . GFR, Est African American 09/25/2014 47*  Final  . GFR, Est Non African American 09/25/2014 41*  Final   Comment:   The estimated GFR is a calculation valid for adults (>=26 years old) that uses the CKD-EPI algorithm to adjust for age and sex. It is   not to be used for children, pregnant women, hospitalized patients,    patients on dialysis, or with rapidly changing kidney function. According to the NKDEP, eGFR >89 is normal, 60-89 shows mild impairment, 30-59 shows moderate impairment, 15-29 shows severe impairment and <15 is ESRD.      LDL cholesterol is excellent patient still has low HDL due to inactivity and her chronic dyslipidemia. She is currently taking niacin. Unfortunately her blood sugars continue to rise. Fasting blood sugars range anywhere from 120-170. She is not checking 2 hour postprandial sugars. She is already taking 80 units of Lantus twice daily. Past Medical History  Diagnosis Date  . Hyperlipidemia   . Hypertension   . Ulcer   . Diabetes mellitus   . Systolic murmur   . Hyperglycemia   . Peripheral artery disease     lower extremities  . CAD (coronary artery disease)     X5  . Obstructive sleep apnea   . Vitamin D deficiency   . Stroke   . Chronic kidney disease   .  Atrial fibrillation     paroxysmal  . Obesity   . GI bleeding   . Gastritis   . Hiatal hernia    Past Surgical History  Procedure Laterality Date  . Coronary artery bypass graft  05/22/2010  . Carotid doppler  10/13/2011    right ICA-49% diameter reduction and left ICA with 50%-69% diameter reduction  . Myoview stress  08/13/2010    inferolateral scar without ischemia  . Angioplasty / stenting femoral Right     leg  . Heart bypass     Current Outpatient Prescriptions on File Prior to Visit  Medication Sig Dispense Refill  . acetaminophen (TYLENOL) 325 MG tablet Take 650 mg by mouth every 6 (six) hours as needed.    [name] amLODipine (NORVASC) 10 MG tablet Take 10 mg by mouth daily.      [name] aspirin 81 MG tablet Take 81 mg by mouth daily.     . B-D ULTRAFINE III SHORT PEN 31G X 8 MM MISC USE AS DIRECTED 100 each 2  . clopidogrel (PLAVIX) 75 MG tablet Take 1 tablet (75 mg total) by mouth daily. 30 tablet 10  . CVS D3 2000 UNITS CAPS TAKE ONE CAPSULE BY MOUTH DAILY 30 capsule 11  . furosemide (LASIX) 40 MG tablet Take 1 tablet (40 mg total) by mouth daily. 30 tablet 10  . hydrALAZINE (APRESOLINE) 25 MG tablet Take 25 mg by mouth as needed. Takes only when BP is above 140/80    . lisinopril (PRINIVIL,ZESTRIL) 20 MG tablet TAKE 1 TABLET BY MOUTH DAILY 30 tablet 11  . metoCLOPramide (REGLAN) 5 MG tablet TAKE 1 TABLET BY MOUTH 3 TIMES A DAY BEFORE MEALS AND 1 AT BEDTIME 120 tablet 11  . NEXIUM 40 MG capsule TAKE 1 CAPSULE BY MOUTH DAILY 30 capsule 11  . niacin (NIASPAN) 750 MG CR tablet TAKE 1 TABLET BY MOUTH EVERY DAY AT BEDTIME 90 tablet 4  .  nitroGLYCERIN (NITROSTAT) 0.4 MG SL tablet Place 0.4 mg under the tongue every 5 (five) minutes as needed.    . Omega-3 Fatty Acids (FISH OIL) 1200 MG CPDR Take 2 tablets by mouth at bedtime.    . rosuvastatin (CRESTOR) 10 MG tablet Take 1 tablet (10 mg total) by mouth daily. 30 tablet 10   No current facility-administered medications on file prior to  visit.   Allergies  Allergen Reactions  . Latex   . Neosporin [Neomycin-Polymyxin-Gramicidin]   . Sulfa Antibiotics Nausea And Vomiting   History   Social History  . Marital Status: Single    Spouse Name: N/A  . Number of Children: 0  . Years of Education: N/A   Occupational History  . Retired    Social History Main Topics  . Smoking status: Former Smoker    Quit date: 01/22/2010  . Smokeless tobacco: Never Used  . Alcohol Use: No  . Drug Use: No  . Sexual Activity: Not on file   Other Topics Concern  . Not on file   Social History Narrative     Review of Systems  All other systems reviewed and are negative.      Objective:   Physical Exam  Constitutional: She appears well-developed and well-nourished.  Neck: Neck supple. No JVD present.  Cardiovascular: Normal rate and regular rhythm.   Murmur heard. Pulmonary/Chest: Effort normal and breath sounds normal. No respiratory distress. She has no wheezes. She has no rales.  Abdominal: Soft. Bowel sounds are normal. She exhibits no distension. There is no tenderness. There is no rebound and no guarding.  Musculoskeletal: She exhibits edema.  Lymphadenopathy:    She has no cervical adenopathy.  Vitals reviewed.         Assessment & Plan:  Diabetes mellitus, type II, insulin dependent  Essential hypertension, benign  HLD (hyperlipidemia)  Patient's blood pressure is excellent I will make no changes in her blood pressure medication. Cholesterol is acceptable as long as her LDL cholesterol is less than 70. Unfortunately her hemoglobin A1c is elevated. I would like the patient to add Tradjenta 5 mg poqdaya nd recheck FBS adn 2 hr pps in 1 month.

## 2014-10-10 ENCOUNTER — Encounter: Payer: Self-pay | Admitting: Gastroenterology

## 2014-10-23 DIAGNOSIS — N189 Chronic kidney disease, unspecified: Secondary | ICD-10-CM | POA: Diagnosis not present

## 2014-10-23 DIAGNOSIS — E889 Metabolic disorder, unspecified: Secondary | ICD-10-CM | POA: Diagnosis not present

## 2014-10-23 DIAGNOSIS — N183 Chronic kidney disease, stage 3 (moderate): Secondary | ICD-10-CM | POA: Diagnosis not present

## 2014-10-25 ENCOUNTER — Other Ambulatory Visit: Payer: Self-pay | Admitting: Family Medicine

## 2014-10-25 NOTE — Telephone Encounter (Signed)
Prescription sent to pharmacy.

## 2014-10-26 DIAGNOSIS — Z23 Encounter for immunization: Secondary | ICD-10-CM | POA: Diagnosis not present

## 2014-10-26 DIAGNOSIS — I10 Essential (primary) hypertension: Secondary | ICD-10-CM | POA: Diagnosis not present

## 2014-10-26 DIAGNOSIS — D631 Anemia in chronic kidney disease: Secondary | ICD-10-CM | POA: Diagnosis not present

## 2014-10-26 DIAGNOSIS — N183 Chronic kidney disease, stage 3 (moderate): Secondary | ICD-10-CM | POA: Diagnosis not present

## 2014-10-26 DIAGNOSIS — E889 Metabolic disorder, unspecified: Secondary | ICD-10-CM | POA: Diagnosis not present

## 2014-11-07 ENCOUNTER — Telehealth: Payer: Self-pay | Admitting: Family Medicine

## 2014-11-07 NOTE — Telephone Encounter (Signed)
Patients son brian would like to know if we care going  be be able to fill her tragenta  Please call him at [phone]

## 2014-11-08 MED ORDER — LINAGLIPTIN 5 MG PO TABS: 5.0000 mg | ORAL_TABLET | Freq: Every day | ORAL | Status: DC

## 2014-11-08 NOTE — Telephone Encounter (Signed)
Med sent to pharm and appt made for f/u on her BS

## 2014-11-14 ENCOUNTER — Ambulatory Visit (INDEPENDENT_AMBULATORY_CARE_PROVIDER_SITE_OTHER): Payer: Medicare Other | Admitting: Family Medicine

## 2014-11-14 ENCOUNTER — Encounter: Payer: Self-pay | Admitting: Family Medicine

## 2014-11-14 VITALS — BP 98/50 | HR 86 | Temp 98.1°F | Resp 18 | Ht <= 58 in | Wt 226.0 lb

## 2014-11-14 DIAGNOSIS — Z794 Long term (current) use of insulin: Secondary | ICD-10-CM

## 2014-11-14 DIAGNOSIS — I2510 Atherosclerotic heart disease of native coronary artery without angina pectoris: Secondary | ICD-10-CM | POA: Diagnosis not present

## 2014-11-14 DIAGNOSIS — E119 Type 2 diabetes mellitus without complications: Secondary | ICD-10-CM

## 2014-11-14 NOTE — Progress Notes (Signed)
Subjective:    Patient ID: [name], female    DOB: 1945-11-27, 69 y.o.   MRN: [id number]  HPI  Patient is currently on Lantus 80 units subcutaneous twice a day. Recently I started the patient on Trajenta 5 mg poqday to help address her two-hour postprandial sugars. She is here today for recheck. Fasting blood sugars are typically 100-140 with a vast majority being less than 130. She is not checking any two-hour postprandial sugars. She continues to graze throughout the day and eat constantly rather than eating 3 distinct meals. She is also not exercising regularly . Past Medical History  Diagnosis Date  . Hyperlipidemia   . Hypertension   . Ulcer   . Diabetes mellitus   . Systolic murmur   . Hyperglycemia   . Peripheral artery disease     lower extremities  . CAD (coronary artery disease)     X5  . Obstructive sleep apnea   . Vitamin D deficiency   . Stroke   . Chronic kidney disease   . Atrial fibrillation     paroxysmal  . Obesity   . GI bleeding   . Gastritis   . Hiatal hernia    Past Surgical History  Procedure Laterality Date  . Coronary artery bypass graft  05/22/2010  . Carotid doppler  10/13/2011    right ICA-49% diameter reduction and left ICA with 50%-69% diameter reduction  . Myoview stress  08/13/2010    inferolateral scar without ischemia  . Angioplasty / stenting femoral Right     leg  . Heart bypass     Current Outpatient Prescriptions on File Prior to Visit  Medication Sig Dispense Refill  . acetaminophen (TYLENOL) 325 MG tablet Take 650 mg by mouth every 6 (six) hours as needed.    [name] amLODipine (NORVASC) 10 MG tablet Take 10 mg by mouth daily.      [name] aspirin 81 MG tablet Take 81 mg by mouth daily.     . B-D ULTRAFINE III SHORT PEN 31G X 8 MM MISC USE AS DIRECTED 100 each 2  . clopidogrel (PLAVIX) 75 MG tablet Take 1 tablet (75 mg total) by mouth daily. 30 tablet 10  . CRESTOR 10 MG tablet TAKE 1 TABLET (10 MG TOTAL) BY MOUTH DAILY. 90 tablet 3   . CVS D3 2000 UNITS CAPS TAKE ONE CAPSULE BY MOUTH DAILY 30 capsule 11  . furosemide (LASIX) 40 MG tablet Take 1 tablet (40 mg total) by mouth daily. 30 tablet 10  . hydrALAZINE (APRESOLINE) 25 MG tablet Take 25 mg by mouth as needed. Takes only when BP is above 140/80    . Insulin Glargine (LANTUS SOLOSTAR) 100 UNIT/ML Solostar Pen INJECT 80 UNITS 2 TIMES DAILY 45 mL 5  . linagliptin (TRADJENTA) 5 MG TABS tablet Take 1 tablet (5 mg total) by mouth daily. 30 tablet 3  . lisinopril (PRINIVIL,ZESTRIL) 20 MG tablet TAKE 1 TABLET BY MOUTH DAILY 30 tablet 11  . metoCLOPramide (REGLAN) 5 MG tablet TAKE 1 TABLET BY MOUTH 3 TIMES A DAY BEFORE MEALS AND 1 AT BEDTIME 120 tablet 11  . NEXIUM 40 MG capsule TAKE 1 CAPSULE BY MOUTH DAILY 30 capsule 11  . niacin (NIASPAN) 750 MG CR tablet TAKE 1 TABLET BY MOUTH EVERY DAY AT BEDTIME 90 tablet 3  . nitroGLYCERIN (NITROSTAT) 0.4 MG SL tablet Place 0.4 mg under the tongue every 5 (five) minutes as needed.    . Omega-3 Fatty Acids (FISH OIL)  1200 MG CPDR Take 2 tablets by mouth at bedtime.     No current facility-administered medications on file prior to visit.   Allergies  Allergen Reactions  . Latex   . Neosporin [Neomycin-Polymyxin-Gramicidin]   . Sulfa Antibiotics Nausea And Vomiting   History   Social History  . Marital Status: Single    Spouse Name: N/A  . Number of Children: 0  . Years of Education: N/A   Occupational History  . Retired    Social History Main Topics  . Smoking status: Former Smoker    Quit date: 01/22/2010  . Smokeless tobacco: Never Used  . Alcohol Use: No  . Drug Use: No  . Sexual Activity: Not on file   Other Topics Concern  . Not on file   Social History Narrative     Review of Systems  All other systems reviewed and are negative.      Objective:   Physical Exam  Constitutional: She appears well-developed and well-nourished.  Cardiovascular: Normal rate, regular rhythm and normal heart sounds.     Pulmonary/Chest: Effort normal and breath sounds normal. No respiratory distress. She has no wheezes. She has no rales.  Abdominal: Soft. Bowel sounds are normal.  Vitals reviewed.         Assessment & Plan:  Diabetes mellitus, type II, insulin dependent  Fasting blood sugars seem acceptable however I'm concerned about her two-hour postprandial sugars. I have asked her to report her two-hour postprandial sugars to me in one week. I have set a goal of less than 180 for this patient. If they're greater than 180, I would start mealtime insulin using NovoLog.

## 2015-01-04 ENCOUNTER — Other Ambulatory Visit: Payer: Self-pay | Admitting: Family Medicine

## 2015-01-11 ENCOUNTER — Other Ambulatory Visit: Payer: Self-pay | Admitting: Family Medicine

## 2015-01-29 ENCOUNTER — Other Ambulatory Visit: Payer: Self-pay

## 2015-02-15 ENCOUNTER — Encounter: Payer: Self-pay | Admitting: Gastroenterology

## 2015-03-03 ENCOUNTER — Other Ambulatory Visit: Payer: Self-pay | Admitting: Family Medicine

## 2015-03-04 ENCOUNTER — Other Ambulatory Visit: Payer: Self-pay | Admitting: Family Medicine

## 2015-05-10 ENCOUNTER — Other Ambulatory Visit: Payer: Self-pay | Admitting: Cardiovascular Disease

## 2015-05-10 NOTE — Telephone Encounter (Signed)
Rx request sent to pharmacy.  

## 2015-05-23 ENCOUNTER — Other Ambulatory Visit: Payer: Self-pay | Admitting: Cardiovascular Disease

## 2015-06-05 ENCOUNTER — Encounter: Payer: Self-pay | Admitting: *Deleted

## 2015-06-07 ENCOUNTER — Other Ambulatory Visit: Payer: Self-pay | Admitting: Family Medicine

## 2015-06-07 NOTE — Telephone Encounter (Signed)
Medication refilled per protocol. 

## 2015-06-13 ENCOUNTER — Other Ambulatory Visit: Payer: Self-pay | Admitting: Cardiovascular Disease

## 2015-06-21 ENCOUNTER — Other Ambulatory Visit: Payer: Self-pay | Admitting: *Deleted

## 2015-06-21 MED ORDER — FUROSEMIDE 40 MG PO TABS: 40.0000 mg | ORAL_TABLET | Freq: Every day | ORAL | Status: DC

## 2015-07-05 ENCOUNTER — Other Ambulatory Visit: Payer: Self-pay | Admitting: Family Medicine

## 2015-07-12 ENCOUNTER — Other Ambulatory Visit: Payer: Self-pay | Admitting: Family Medicine

## 2015-07-12 ENCOUNTER — Other Ambulatory Visit: Payer: Self-pay | Admitting: Cardiovascular Disease

## 2015-07-12 DIAGNOSIS — N183 Chronic kidney disease, stage 3 (moderate): Secondary | ICD-10-CM | POA: Diagnosis not present

## 2015-07-12 DIAGNOSIS — E889 Metabolic disorder, unspecified: Secondary | ICD-10-CM | POA: Diagnosis not present

## 2015-07-12 DIAGNOSIS — N189 Chronic kidney disease, unspecified: Secondary | ICD-10-CM | POA: Diagnosis not present

## 2015-07-12 NOTE — Telephone Encounter (Signed)
Refill appropriate and filled per protocol. 

## 2015-07-12 NOTE — Telephone Encounter (Signed)
REFILL 

## 2015-07-18 DIAGNOSIS — D631 Anemia in chronic kidney disease: Secondary | ICD-10-CM | POA: Diagnosis not present

## 2015-07-18 DIAGNOSIS — E889 Metabolic disorder, unspecified: Secondary | ICD-10-CM | POA: Diagnosis not present

## 2015-07-18 DIAGNOSIS — N183 Chronic kidney disease, stage 3 (moderate): Secondary | ICD-10-CM | POA: Diagnosis not present

## 2015-07-18 DIAGNOSIS — I10 Essential (primary) hypertension: Secondary | ICD-10-CM | POA: Diagnosis not present

## 2015-07-18 DIAGNOSIS — M9080 Osteopathy in diseases classified elsewhere, unspecified site: Secondary | ICD-10-CM | POA: Diagnosis not present

## 2015-07-26 ENCOUNTER — Other Ambulatory Visit: Payer: Self-pay | Admitting: Cardiovascular Disease

## 2015-07-26 NOTE — Telephone Encounter (Signed)
Rx(s) sent to pharmacy electronically.  

## 2015-07-31 ENCOUNTER — Encounter: Payer: Self-pay | Admitting: *Deleted

## 2015-08-09 ENCOUNTER — Other Ambulatory Visit: Payer: Self-pay | Admitting: Cardiovascular Disease

## 2015-08-09 NOTE — Telephone Encounter (Signed)
REFILL 

## 2015-08-13 ENCOUNTER — Other Ambulatory Visit: Payer: Self-pay | Admitting: Cardiovascular Disease

## 2015-08-13 NOTE — Telephone Encounter (Signed)
REFILL 

## 2015-08-28 ENCOUNTER — Other Ambulatory Visit: Payer: Self-pay | Admitting: Family Medicine

## 2015-09-17 ENCOUNTER — Other Ambulatory Visit: Payer: Self-pay | Admitting: Family Medicine

## 2015-09-17 ENCOUNTER — Other Ambulatory Visit: Payer: Medicare Other

## 2015-09-17 DIAGNOSIS — E559 Vitamin D deficiency, unspecified: Secondary | ICD-10-CM

## 2015-09-17 DIAGNOSIS — Z79899 Other long term (current) drug therapy: Secondary | ICD-10-CM

## 2015-09-17 DIAGNOSIS — I10 Essential (primary) hypertension: Secondary | ICD-10-CM | POA: Diagnosis not present

## 2015-09-17 DIAGNOSIS — E1122 Type 2 diabetes mellitus with diabetic chronic kidney disease: Secondary | ICD-10-CM | POA: Diagnosis not present

## 2015-09-17 DIAGNOSIS — E785 Hyperlipidemia, unspecified: Secondary | ICD-10-CM | POA: Diagnosis not present

## 2015-09-17 DIAGNOSIS — N186 End stage renal disease: Principal | ICD-10-CM

## 2015-09-17 DIAGNOSIS — E1129 Type 2 diabetes mellitus with other diabetic kidney complication: Secondary | ICD-10-CM | POA: Diagnosis not present

## 2015-09-17 LAB — COMPLETE METABOLIC PANEL WITH GFR
ALBUMIN: 3.3 g/dL — AB (ref 3.6–5.1)
ALK PHOS: 66 U/L (ref 33–130)
ALT: 6 U/L (ref 6–29)
AST: 10 U/L (ref 10–35)
BILIRUBIN TOTAL: 0.2 mg/dL (ref 0.2–1.2)
BUN: 23 mg/dL (ref 7–25)
CALCIUM: 8.9 mg/dL (ref 8.6–10.4)
CO2: 29 mmol/L (ref 20–31)
CREATININE: 1.34 mg/dL — AB (ref 0.60–0.93)
Chloride: 104 mmol/L (ref 98–110)
GFR, Est African American: 46 mL/min — ABNORMAL LOW (ref >=60)
GFR, Est Non African American: 40 mL/min — ABNORMAL LOW (ref >=60)
Glucose, Bld: 112 mg/dL — ABNORMAL HIGH (ref 70–99)
Potassium: 4 mmol/L (ref 3.5–5.3)
Sodium: 144 mmol/L (ref 135–146)
TOTAL PROTEIN: 6.2 g/dL (ref 6.1–8.1)

## 2015-09-17 LAB — CBC WITH DIFFERENTIAL/PLATELET
BASOS ABS: 0.1 10*3/uL (ref 0.0–0.1)
Basophils Relative: 1 % (ref 0–1)
EOS ABS: 0.2 10*3/uL (ref 0.0–0.7)
Eosinophils Relative: 3 % (ref 0–5)
HCT: 33.7 % — ABNORMAL LOW (ref 36.0–46.0)
Hemoglobin: 10.1 g/dL — ABNORMAL LOW (ref 12.0–15.0)
LYMPHS ABS: 1.5 10*3/uL (ref 0.7–4.0)
Lymphocytes Relative: 20 % (ref 12–46)
MCH: 26.6 pg (ref 26.0–34.0)
MCHC: 30 g/dL (ref 30.0–36.0)
MCV: 88.9 fL (ref 78.0–100.0)
MPV: 10.4 fL (ref 8.6–12.4)
Monocytes Absolute: 0.7 10*3/uL (ref 0.1–1.0)
Monocytes Relative: 9 % (ref 3–12)
NEUTROS PCT: 67 % (ref 43–77)
Neutro Abs: 5.2 10*3/uL (ref 1.7–7.7)
PLATELETS: 202 10*3/uL (ref 150–400)
RBC: 3.79 MIL/uL — ABNORMAL LOW (ref 3.87–5.11)
RDW: 15.5 % (ref 11.5–15.5)
WBC: 7.7 10*3/uL (ref 4.0–10.5)

## 2015-09-17 LAB — LIPID PANEL
CHOLESTEROL: 104 mg/dL — AB (ref 125–200)
HDL: 29 mg/dL — ABNORMAL LOW (ref >=46)
LDL Cholesterol: 50 mg/dL (ref <130)
Total CHOL/HDL Ratio: 3.6 Ratio (ref <=5.0)
Triglycerides: 125 mg/dL (ref <150)
VLDL: 25 mg/dL (ref <30)

## 2015-09-17 LAB — HEMOGLOBIN A1C
HEMOGLOBIN A1C: 6.6 % — AB (ref <5.7)
Mean Plasma Glucose: 143 mg/dL — ABNORMAL HIGH (ref <117)

## 2015-09-17 LAB — TSH: TSH: 2.51 m[IU]/L

## 2015-09-18 ENCOUNTER — Ambulatory Visit: Payer: Medicare Other | Admitting: Cardiovascular Disease

## 2015-09-18 ENCOUNTER — Ambulatory Visit (INDEPENDENT_AMBULATORY_CARE_PROVIDER_SITE_OTHER): Payer: Medicare Other | Admitting: Family Medicine

## 2015-09-18 ENCOUNTER — Encounter: Payer: Self-pay | Admitting: Family Medicine

## 2015-09-18 VITALS — BP 130/60 | HR 76 | Temp 98.2°F | Resp 20 | Ht <= 58 in | Wt 224.0 lb

## 2015-09-18 DIAGNOSIS — E119 Type 2 diabetes mellitus without complications: Secondary | ICD-10-CM | POA: Diagnosis not present

## 2015-09-18 DIAGNOSIS — E785 Hyperlipidemia, unspecified: Secondary | ICD-10-CM

## 2015-09-18 DIAGNOSIS — Z23 Encounter for immunization: Secondary | ICD-10-CM | POA: Diagnosis not present

## 2015-09-18 DIAGNOSIS — I10 Essential (primary) hypertension: Secondary | ICD-10-CM

## 2015-09-18 DIAGNOSIS — Z794 Long term (current) use of insulin: Secondary | ICD-10-CM

## 2015-09-18 LAB — VITAMIN D 25 HYDROXY (VIT D DEFICIENCY, FRACTURES): VIT D 25 HYDROXY: 51 ng/mL (ref 30–100)

## 2015-09-18 NOTE — Progress Notes (Signed)
Subjective:    Patient ID: [name], female    DOB: 10-03-1945, 70 y.o.   MRN: [id number]  HPI  11/14/14 Patient is currently on Lantus 80 units subcutaneous twice a day. Recently I started the patient on Trajenta 5 mg poqday to help address her two-hour postprandial sugars. She is here today for recheck. Fasting blood sugars are typically 100-140 with a vast majority being less than 130. She is not checking any two-hour postprandial sugars. She continues to graze throughout the day and eat constantly rather than eating 3 distinct meals. She is also not exercising regularly.  At that time, my plan was:  Fasting blood sugars seem acceptable however I'm concerned about her two-hour postprandial sugars. I have asked her to report her two-hour postprandial sugars to me in one week. I have set a goal of less than 180 for this patient. If they're greater than 180, I would start mealtime insulin using NovoLog.  09/18/15  patient is here today for follow-up. Her blood pressure is excellent 130/60. She denies any episodes of hypoglycemia. She denies any polyuria, polydipsia, or blurred vision. Her most recent lab work as listed below: Appointment on 09/17/2015  Component Date Value Ref Range Status  . Sodium 09/17/2015 144  135 - 146 mmol/L Final  . Potassium 09/17/2015 4.0  3.5 - 5.3 mmol/L Final  . Chloride 09/17/2015 104  98 - 110 mmol/L Final  . CO2 09/17/2015 29  20 - 31 mmol/L Final  . Glucose, Bld 09/17/2015 112* 70 - 99 mg/dL Final  . BUN 09/17/2015 23  7 - 25 mg/dL Final  . Creat 09/17/2015 1.34* 0.60 - 0.93 mg/dL Final  . Total Bilirubin 09/17/2015 0.2  0.2 - 1.2 mg/dL Final  . Alkaline Phosphatase 09/17/2015 66  33 - 130 U/L Final  . AST 09/17/2015 10  10 - 35 U/L Final  . ALT 09/17/2015 6  6 - 29 U/L Final  . Total Protein 09/17/2015 6.2  6.1 - 8.1 g/dL Final  . Albumin 09/17/2015 3.3* 3.6 - 5.1 g/dL Final  . Calcium 09/17/2015 8.9  8.6 - 10.4 mg/dL Final  . GFR, Est African  American 09/17/2015 46* >=60 mL/min Final  . GFR, Est Non African American 09/17/2015 40* >=60 mL/min Final   Comment:   The estimated GFR is a calculation valid for adults (>=73 years old) that uses the CKD-EPI algorithm to adjust for age and sex. It is   not to be used for children, pregnant women, hospitalized patients,    patients on dialysis, or with rapidly changing kidney function. According to the NKDEP, eGFR >89 is normal, 60-89 shows mild impairment, 30-59 shows moderate impairment, 15-29 shows severe impairment and <15 is ESRD.     [name] Cholesterol 09/17/2015 104* 125 - 200 mg/dL Final  . Triglycerides 09/17/2015 125  <150 mg/dL Final  . HDL 09/17/2015 29* >=46 mg/dL Final  . Total CHOL/HDL Ratio 09/17/2015 3.6  <=5.0 Ratio Final  . VLDL 09/17/2015 25  <30 mg/dL Final  . LDL Cholesterol 09/17/2015 50  <130 mg/dL Final   Comment:   Total Cholesterol/HDL Ratio:CHD Risk                        Coronary Heart Disease Risk Table  Men       Women          1/2 Average Risk              3.4        3.3              Average Risk              5.0        4.4           2X Average Risk              9.6        7.1           3X Average Risk             23.4       11.0 Use the calculated Patient Ratio above and the CHD Risk table  to determine the patient's CHD Risk.   . WBC 09/17/2015 7.7  4.0 - 10.5 K/uL Final  . RBC 09/17/2015 3.79* 3.87 - 5.11 MIL/uL Final  . Hemoglobin 09/17/2015 10.1* 12.0 - 15.0 g/dL Final  . HCT 09/17/2015 33.7* 36.0 - 46.0 % Final  . MCV 09/17/2015 88.9  78.0 - 100.0 fL Final  . MCH 09/17/2015 26.6  26.0 - 34.0 pg Final  . MCHC 09/17/2015 30.0  30.0 - 36.0 g/dL Final  . RDW 09/17/2015 15.5  11.5 - 15.5 % Final  . Platelets 09/17/2015 202  150 - 400 K/uL Final  . MPV 09/17/2015 10.4  8.6 - 12.4 fL Final  . Neutrophils Relative % 09/17/2015 67  43 - 77 % Final  . Neutro Abs 09/17/2015 5.2  1.7 - 7.7 K/uL Final  . Lymphocytes  Relative 09/17/2015 20  12 - 46 % Final  . Lymphs Abs 09/17/2015 1.5  0.7 - 4.0 K/uL Final  . Monocytes Relative 09/17/2015 9  3 - 12 % Final  . Monocytes Absolute 09/17/2015 0.7  0.1 - 1.0 K/uL Final  . Eosinophils Relative 09/17/2015 3  0 - 5 % Final  . Eosinophils Absolute 09/17/2015 0.2  0.0 - 0.7 K/uL Final  . Basophils Relative 09/17/2015 1  0 - 1 % Final  . Basophils Absolute 09/17/2015 0.1  0.0 - 0.1 K/uL Final  . Smear Review 09/17/2015 Criteria for review not met   Final  . Hgb A1c MFr Bld 09/17/2015 6.6* <5.7 % Final   Comment:                                                                        According to the ADA Clinical Practice Recommendations for 2011, when HbA1c is used as a screening test:     >=6.5%   Diagnostic of Diabetes Mellitus            (if abnormal result is confirmed)   5.7-6.4%   Increased risk of developing Diabetes Mellitus   References:Diagnosis and Classification of Diabetes Mellitus,Diabetes KZLD,3570,17(BLTJQ 1):S62-S69 and Standards of Medical Care in         Diabetes - 2011,Diabetes ZESP,2330,07 (Suppl 1):S11-S61.     . Mean Plasma Glucose 09/17/2015 143* <117 mg/dL Final  . TSH 09/17/2015 2.51   Final  Comment:   Reference Range   > or = 20 Years  0.40-4.50   Pregnancy Range First trimester  0.26-2.66 Second trimester 0.55-2.73 Third trimester  0.43-2.91   ** Please note change in unit of measure and reference range(s). **     . Vit D, 25-Hydroxy 09/17/2015 51  30 - 100 ng/mL Final   Comment: Vitamin D Status           25-OH Vitamin D        Deficiency                <20 ng/mL        Insufficiency         20 - 29 ng/mL        Optimal             > or = 30 ng/mL   For 25-OH Vitamin D testing on patients on D2-supplementation and patients for whom quantitation of D2 and D3 fractions is required, the [facility] 25-OH VIT D, (D2,D3), LC/MS/MS is recommended: order code [id number] (patients > 2 yrs).     . Past Medical History    Diagnosis Date  . Hyperlipidemia   . Hypertension   . Ulcer   . Diabetes mellitus   . Systolic murmur   . Hyperglycemia   . Peripheral artery disease (HCC)     lower extremities  . CAD (coronary artery disease)     X5  . Obstructive sleep apnea   . Vitamin D deficiency   . Stroke ([place])   . Chronic kidney disease   . Atrial fibrillation (HCC)     paroxysmal  . Obesity   . GI bleeding   . Gastritis   . Hiatal hernia    Past Surgical History  Procedure Laterality Date  . Coronary artery bypass graft  05/22/2010  . Carotid doppler  10/13/2011    right ICA-49% diameter reduction and left ICA with 50%-69% diameter reduction  . Myoview stress  08/13/2010    inferolateral scar without ischemia  . Angioplasty / stenting femoral Right     leg  . Heart bypass     Current Outpatient Prescriptions on File Prior to Visit  Medication Sig Dispense Refill  . acetaminophen (TYLENOL) 325 MG tablet Take 650 mg by mouth every 6 (six) hours as needed.    [name] amLODipine (NORVASC) 10 MG tablet Take 10 mg by mouth daily.      [name] aspirin 81 MG tablet Take 81 mg by mouth daily.     . B-D ULTRAFINE III SHORT PEN 31G X 8 MM MISC USE AS DIRECTED 100 each 2  . clopidogrel (PLAVIX) 75 MG tablet TAKE 1 TABLET (75 MG TOTAL) BY MOUTH DAILY. NEED OV. 30 tablet 1  . CRESTOR 10 MG tablet TAKE 1 TABLET (10 MG TOTAL) BY MOUTH DAILY. 90 tablet 3  . CVS VITAMIN D 2000 units CAPS TAKE ONE CAPSULE BY MOUTH DAILY 30 capsule 11  . furosemide (LASIX) 40 MG tablet Take 1 tablet (40 mg total) by mouth daily. KEEP OV. 30 tablet 1  . hydrALAZINE (APRESOLINE) 25 MG tablet Take 25 mg by mouth as needed. Takes only when BP is above 140/80    . LANTUS SOLOSTAR 100 UNIT/ML Solostar Pen INJECT 80 UNITS 2 TIMES DAILY 45 pen 5  . lisinopril (PRINIVIL,ZESTRIL) 20 MG tablet TAKE 1 TABLET EVERY DAY 30 tablet 11  . metoCLOPramide (REGLAN) 5 MG tablet TAKE 1 TABLET BY MOUTH 3 TIMES A  DAY BEFORE MEALS AND 1 AT BEDTIME 120 tablet 5  .  NEXIUM 40 MG capsule TAKE 1 CAPSULE BY MOUTH DAILY 30 capsule 11  . niacin (NIASPAN) 750 MG CR tablet TAKE 1 TABLET BY MOUTH EVERY DAY AT BEDTIME 90 tablet 3  . nitroGLYCERIN (NITROSTAT) 0.4 MG SL tablet Place 0.4 mg under the tongue every 5 (five) minutes as needed.    . Omega-3 Fatty Acids (FISH OIL) 1200 MG CPDR Take 2 tablets by mouth at bedtime.    . TRADJENTA 5 MG TABS tablet TAKE 1 TABLET BY MOUTH EVERY DAY 30 tablet 3   No current facility-administered medications on file prior to visit.   Allergies  Allergen Reactions  . Latex   . Neosporin [Neomycin-Polymyxin-Gramicidin]   . Sulfa Antibiotics Nausea And Vomiting   Social History   Social History  . Marital Status: Single    Spouse Name: N/A  . Number of Children: 0  . Years of Education: N/A   Occupational History  . Retired    Social History Main Topics  . Smoking status: Former Smoker    Quit date: 01/22/2010  . Smokeless tobacco: Never Used  . Alcohol Use: No  . Drug Use: No  . Sexual Activity: Not on file   Other Topics Concern  . Not on file   Social History Narrative     Review of Systems  All other systems reviewed and are negative.      Objective:   Physical Exam  Constitutional: She appears well-developed and well-nourished.  Cardiovascular: Normal rate, regular rhythm and normal heart sounds.   Pulmonary/Chest: Effort normal and breath sounds normal. No respiratory distress. She has no wheezes. She has no rales.  Abdominal: Soft. Bowel sounds are normal.  Vitals reviewed.         Assessment & Plan:  Diabetes mellitus, type II, insulin dependent (HCC)  Essential hypertension, benign  HLD (hyperlipidemia)  Blood pressure is excellent. Chronic kidney disease is stable. Hemoglobin A1c is outstanding. Cholesterol is acceptable. Patient does have low HDL cholesterol and I recommended increasing aerobic exercise and try to lose weight. Otherwise I'm very happy with her lab work and will  make no changes in her medication at this time.

## 2015-09-19 NOTE — Addendum Note (Signed)
Addended by: Shary Decamp B on: 09/19/2015 02:48 PM   Modules accepted: Orders

## 2015-10-02 ENCOUNTER — Ambulatory Visit (INDEPENDENT_AMBULATORY_CARE_PROVIDER_SITE_OTHER): Payer: Medicare Other | Admitting: Cardiovascular Disease

## 2015-10-02 ENCOUNTER — Encounter: Payer: Self-pay | Admitting: Cardiovascular Disease

## 2015-10-02 VITALS — BP 160/52 | HR 97 | Ht <= 58 in | Wt 226.0 lb

## 2015-10-02 DIAGNOSIS — I350 Nonrheumatic aortic (valve) stenosis: Secondary | ICD-10-CM | POA: Diagnosis not present

## 2015-10-02 DIAGNOSIS — N183 Chronic kidney disease, stage 3 (moderate): Secondary | ICD-10-CM

## 2015-10-02 DIAGNOSIS — I471 Supraventricular tachycardia: Secondary | ICD-10-CM

## 2015-10-02 DIAGNOSIS — E785 Hyperlipidemia, unspecified: Secondary | ICD-10-CM

## 2015-10-02 DIAGNOSIS — I739 Peripheral vascular disease, unspecified: Secondary | ICD-10-CM

## 2015-10-02 DIAGNOSIS — I4719 Other supraventricular tachycardia: Secondary | ICD-10-CM

## 2015-10-02 DIAGNOSIS — E1122 Type 2 diabetes mellitus with diabetic chronic kidney disease: Secondary | ICD-10-CM

## 2015-10-02 DIAGNOSIS — Z794 Long term (current) use of insulin: Secondary | ICD-10-CM

## 2015-10-02 DIAGNOSIS — I10 Essential (primary) hypertension: Secondary | ICD-10-CM

## 2015-10-02 DIAGNOSIS — I2510 Atherosclerotic heart disease of native coronary artery without angina pectoris: Secondary | ICD-10-CM | POA: Diagnosis not present

## 2015-10-02 NOTE — Progress Notes (Signed)
Patient ID: [name], female   DOB: 01-May-1946, 70 y.o.   MRN: [id number]    Cardiology Office Note    Date:  10/03/2015   ID:  [name], DOB November 30, 1945, MRN [id number]  PCP:  Odette Fraction, MD  Cardiologist:  Quay Burow, M.D.; Sanda Klein, MD   Chief Complaint  Patient presents with  . Annual Exam    PACER CHECK  . Chest Pain    TINGING IN HER LEFT BREAST    History of Present Illness:  [name] is a 70 y.o. female with long-standing problems with coronary and peripheral arterial disease in the setting of diabetes, hyperlipidemia, hypertension and mild-moderate chronic kidney disease presents for routine follow-up.  She had a loop recorder that has reached end of service. At her request we have left a device in situ. In the past, the device showed recurrent brief episodes of atrial tachycardia, frequent atrial bigeminy and trigeminy, but never showed atrial fibrillation.  She has a history of bypass surgery in 2011. She has not required coronary angiography or nuclear stress testing since that time. She does not have angina. Most recent echo in 2013 showed LVEF 78-24%, grade 1 diastolic dysfunction, mild to moderately dilated left atrium, very mild aortic valve stenosis.  She has known moderate left renal artery stenosis but her blood pressure is well controlled. She sees Dr. Posey Pronto in the nephrology clinic, but her most recent creatinine level was actually a substantial improvement from the past. She has moderate left carotid artery stenosis and mild right carotid artery stenosis and a history of remote stroke with a left visual field cut. Has not had any new neurological complaints and denies problems with leg edema, palpitations, syncope, excessive fatigue.  She has known bilateral femoral artery occlusion,  felt best treated medically. She does appear to describe intermittent claudication of the buttocks. She has an appointment with Dr. Gwenlyn Found next month.  She denies angina or dyspnea on exertion, but activity level is limited by her leg problems.    Past Medical History  Diagnosis Date  . Hyperlipidemia   . Hypertension   . Ulcer   . Diabetes mellitus   . Systolic murmur   . Hyperglycemia   . Peripheral artery disease (HCC)     lower extremities  . CAD (coronary artery disease)     X5  . Obstructive sleep apnea   . Vitamin D deficiency   . Stroke ([place])   . Chronic kidney disease   . Atrial fibrillation (HCC)     paroxysmal  . Obesity   . GI bleeding   . Gastritis   . Hiatal hernia     Past Surgical History  Procedure Laterality Date  . Coronary artery bypass graft  05/22/2010  . Carotid doppler  10/13/2011    right ICA-49% diameter reduction and left ICA with 50%-69% diameter reduction  . Myoview stress  08/13/2010    inferolateral scar without ischemia  . Angioplasty / stenting femoral Right     leg  . Heart bypass      Outpatient Prescriptions Prior to Visit  Medication Sig Dispense Refill  . acetaminophen (TYLENOL) 325 MG tablet Take 650 mg by mouth every 6 (six) hours as needed.    [name] amLODipine (NORVASC) 10 MG tablet Take 10 mg by mouth daily.      [name] aspirin 81 MG tablet Take 81 mg by mouth daily.     . B-D ULTRAFINE III SHORT PEN 31G X 8  MM MISC USE AS DIRECTED 100 each 2  . clopidogrel (PLAVIX) 75 MG tablet TAKE 1 TABLET (75 MG TOTAL) BY MOUTH DAILY. NEED OV. 30 tablet 1  . CRESTOR 10 MG tablet TAKE 1 TABLET (10 MG TOTAL) BY MOUTH DAILY. 90 tablet 3  . [facility] VITAMIN D 2000 units CAPS TAKE ONE CAPSULE BY MOUTH DAILY 30 capsule 11  . furosemide (LASIX) 40 MG tablet Take 1 tablet (40 mg total) by mouth daily. KEEP OV. 30 tablet 1  . hydrALAZINE (APRESOLINE) 25 MG tablet Take 25 mg by mouth as needed. Takes only when BP is above 140/80    . LANTUS SOLOSTAR 100 UNIT/ML Solostar Pen INJECT 80 UNITS 2 TIMES DAILY 45 pen 5  . lisinopril (PRINIVIL,ZESTRIL) 20 MG tablet TAKE 1 TABLET EVERY DAY 30 tablet 11  .  metoCLOPramide (REGLAN) 5 MG tablet TAKE 1 TABLET BY MOUTH 3 TIMES A DAY BEFORE MEALS AND 1 AT BEDTIME 120 tablet 5  . NEXIUM 40 MG capsule TAKE 1 CAPSULE BY MOUTH DAILY 30 capsule 11  . niacin (NIASPAN) 750 MG CR tablet TAKE 1 TABLET BY MOUTH EVERY DAY AT BEDTIME 90 tablet 3  . nitroGLYCERIN (NITROSTAT) 0.4 MG SL tablet Place 0.4 mg under the tongue every 5 (five) minutes as needed.    . Omega-3 Fatty Acids (FISH OIL) 1200 MG CPDR Take 2 tablets by mouth at bedtime.    . TRADJENTA 5 MG TABS tablet TAKE 1 TABLET BY MOUTH EVERY DAY 30 tablet 3   No facility-administered medications prior to visit.     Allergies:   Latex; Neosporin; and Sulfa antibiotics   Social History   Social History  . Marital Status: Single    Spouse Name: N/A  . Number of Children: 0  . Years of Education: N/A   Occupational History  . Retired    Social History Main Topics  . Smoking status: Former Smoker    Quit date: 01/22/2010  . Smokeless tobacco: Never Used  . Alcohol Use: No  . Drug Use: No  . Sexual Activity: Not Asked   Other Topics Concern  . None   Social History Narrative     Family History:  The patient's family history includes COPD in her brother; Cancer in her mother; Diabetes in her sister; Heart disease in her brother. There is no history of Colon cancer, Colon polyps, Gallbladder disease, Kidney disease, or Esophageal cancer.   ROS:   Please see the history of present illness.    ROS All other systems reviewed and are negative.   PHYSICAL EXAM:   VS:  BP 160/52 mmHg  Pulse 97  Ht '4\' 9"'$  (1.448 m)  Wt 102.513 kg (226 lb)  BMI 48.89 kg/m2   GEN: Well nourished, well developed, in no acute distress HEENT: normal Neck: no JVD, has faint bilateral carotid bruits, no masses Cardiac: RRR; 2/6 early peaking systolic ejection murmur, no diastolic murmurs murmurs, rubs, or gallops,no edema  Respiratory:  clear to auscultation bilaterally, normal work of breathing; loop recorder site  appears healthy GI: soft, nontender, nondistended, + BS MS: no deformity or atrophy Skin: warm and dry, no rash Neuro:  Alert and Oriented x 3, Strength and sensation are intact Psych: euthymic mood, full affect  Wt Readings from Last 3 Encounters:  10/02/15 102.513 kg (226 lb)  09/18/15 101.606 kg (224 lb)  11/14/14 102.513 kg (226 lb)      Studies/Labs Reviewed:   EKG:  EKG is ordered today.  The ekg ordered today demonstrates sinus rhythm with poor R-wave progression from V1 to V6 consistent with an old anterior myocardial infarction  Recent Labs: 09/17/2015: ALT 6; BUN 23; Creat 1.34*; Hemoglobin 10.1*; Platelets 202; Potassium 4.0; Sodium 144; TSH 2.51   Lipid Panel    Component Value Date/Time   CHOL 104* 09/17/2015 0851   TRIG 125 09/17/2015 0851   HDL 29* 09/17/2015 0851   CHOLHDL 3.6 09/17/2015 0851   VLDL 25 09/17/2015 0851   LDLCALC 50 09/17/2015 0851     ASSESSMENT:    1. Coronary artery disease involving native coronary artery of native heart without angina pectoris   2. Atrial tachycardia, paroxysmal ([place])   3. Mild aortic stenosis by prior echocardiogram   4. Peripheral artery disease ([place])   5. Essential hypertension   6. Hyperlipidemia   7. Type 2 diabetes mellitus with stage 3 chronic kidney disease, with long-term current use of insulin (HCC)      PLAN:  In order of problems listed above:  1. CAD s/p CABG: Asymptomatic, preserved LV function 2. PAT: Although she has mild to moderate left atrial dilatation and is at risk for developing true atrial fibrillation, this was not documented during 3 years of implantable loop recorder follow up in the past. 3. AS: Her physical exam suggest that she still has only mild aortic valve stenosis 4. PAD: I think she is describing buttock claudication, but she was felt best suited for medical therapy at her last evaluation. She has an appointment with Dr. Gwenlyn Found next month. 5. HTN: Systolic blood pressure today is  elevated but she states this is quite unusual for her. At home she consistently has blood pressure in the 120-130/50-60 range. No changes are made to her medications today 6. HLP: great LDL cholesterol level, HDL remains low 7. DM: Glycemic control with last A1c of 6.6%. Her creatinine has been stable over the last couple of years, with a GFR of approx. 40 mL/minute    Medication Adjustments/Labs and Tests Ordered: Current medicines are reviewed at length with the patient today.  Concerns regarding medicines are outlined above.  Medication changes, Labs and Tests ordered today are listed in the Patient Instructions below. Patient Instructions  Dr. Sallyanne Kuster recommends that you schedule a follow-up appointment in: AS NEEDED.  FOLLOW-UP CARDIAC CARE WITH DR. Macario Golds, MD  10/03/2015 1:27 PM    [facility] [place], [place],   [address] Phone: [phone]; Fax: [phone]

## 2015-10-02 NOTE — Patient Instructions (Signed)
Dr. Sallyanne Kuster recommends that you schedule a follow-up appointment in: AS NEEDED.  [facility]. Gwenlyn Found

## 2015-10-03 ENCOUNTER — Other Ambulatory Visit: Payer: Self-pay | Admitting: Family Medicine

## 2015-10-03 MED ORDER — LISINOPRIL 20 MG PO TABS: 20.0000 mg | ORAL_TABLET | Freq: Every day | ORAL | Status: DC

## 2015-10-03 MED ORDER — LINAGLIPTIN 5 MG PO TABS: 5.0000 mg | ORAL_TABLET | Freq: Every day | ORAL | Status: DC

## 2015-10-03 NOTE — Telephone Encounter (Signed)
Medication called/sent to requested pharmacy  

## 2015-10-04 ENCOUNTER — Encounter: Payer: Self-pay | Admitting: Cardiovascular Disease

## 2015-10-08 ENCOUNTER — Other Ambulatory Visit: Payer: Self-pay | Admitting: Family Medicine

## 2015-10-08 MED ORDER — INSULIN GLARGINE 100 UNIT/ML SOLOSTAR PEN
PEN_INJECTOR | SUBCUTANEOUS | Status: DC
Start: 2015-10-08 — End: 2016-07-21

## 2015-10-09 ENCOUNTER — Encounter: Payer: Self-pay | Admitting: *Deleted

## 2015-10-12 ENCOUNTER — Other Ambulatory Visit: Payer: Self-pay | Admitting: Cardiovascular Disease

## 2015-10-15 NOTE — Telephone Encounter (Signed)
REFILL 

## 2015-10-19 ENCOUNTER — Other Ambulatory Visit: Payer: Self-pay | Admitting: Family Medicine

## 2015-10-23 ENCOUNTER — Ambulatory Visit: Payer: Medicare Other | Admitting: Cardiovascular Disease

## 2015-10-28 ENCOUNTER — Other Ambulatory Visit: Payer: Self-pay | Admitting: Family Medicine

## 2015-10-29 NOTE — Telephone Encounter (Signed)
Refill appropriate and filled per protocol. 

## 2015-11-07 ENCOUNTER — Other Ambulatory Visit: Payer: Self-pay | Admitting: Family Medicine

## 2015-11-09 ENCOUNTER — Ambulatory Visit: Payer: Medicare Other | Admitting: Cardiovascular Disease

## 2015-11-13 ENCOUNTER — Other Ambulatory Visit: Payer: Self-pay | Admitting: Cardiovascular Disease

## 2015-11-14 ENCOUNTER — Telehealth: Payer: Self-pay | Admitting: Family Medicine

## 2015-11-14 MED ORDER — PANTOPRAZOLE SODIUM 40 MG PO TBEC: 40.0000 mg | DELAYED_RELEASE_TABLET | Freq: Every day | ORAL | Status: DC

## 2015-11-14 NOTE — Telephone Encounter (Signed)
Pharmacy called and states that nexium and plavix have an interaction warning - Changed pt to protonix and pharm will let pt know.

## 2015-11-14 NOTE — Telephone Encounter (Signed)
Rx(s) sent to pharmacy electronically.  

## 2015-11-15 NOTE — Telephone Encounter (Signed)
ok 

## 2015-11-19 ENCOUNTER — Telehealth: Payer: Self-pay | Admitting: Family Medicine

## 2015-11-19 NOTE — Telephone Encounter (Signed)
I don't think the protonix had anything to do with her blood pressure spiking.

## 2015-11-19 NOTE — Telephone Encounter (Signed)
Does this mean they brought one in to be filled out or they need me to fill out the ones we have?

## 2015-11-19 NOTE — Telephone Encounter (Signed)
Handicap placard routed on 11/19/15 per caregiver's request.

## 2015-11-19 NOTE — Telephone Encounter (Signed)
Pt's caregiver called to advise Dr. Dennard Schaumann that she took her first dose of Protonix yesterday. It caused her BP to spike to 188/110.  She has discontinued use of Protonix and is back to taking Nexium. Her BP is back to normal.  Please advise [phone]

## 2015-11-21 NOTE — Telephone Encounter (Signed)
Pt aware and will restart it - was informed if she continues to have problems with her BP to call us back

## 2015-11-29 ENCOUNTER — Telehealth: Payer: Self-pay | Admitting: Cardiovascular Disease

## 2015-11-29 ENCOUNTER — Other Ambulatory Visit: Payer: Self-pay | Admitting: Cardiovascular Disease

## 2015-11-29 NOTE — Telephone Encounter (Signed)
[facility] is calling because the instructions for the  Hydralazine '25mg'$  says to take one as needed and it does not say what it is for  . Would Like to get some clarification. Please call   Thanks

## 2015-11-29 NOTE — Telephone Encounter (Signed)
Spoke with [facility] at [facility] to clarify medication sig

## 2015-11-29 NOTE — Telephone Encounter (Signed)
Rx has been sent to the pharmacy electronically. ° °

## 2015-11-30 ENCOUNTER — Encounter: Payer: Self-pay | Admitting: Cardiovascular Disease

## 2015-11-30 ENCOUNTER — Ambulatory Visit (INDEPENDENT_AMBULATORY_CARE_PROVIDER_SITE_OTHER): Payer: Medicare Other | Admitting: Cardiovascular Disease

## 2015-11-30 VITALS — BP 154/50 | HR 91 | Ht <= 58 in | Wt 231.0 lb

## 2015-11-30 DIAGNOSIS — I739 Peripheral vascular disease, unspecified: Secondary | ICD-10-CM

## 2015-11-30 DIAGNOSIS — I2510 Atherosclerotic heart disease of native coronary artery without angina pectoris: Secondary | ICD-10-CM | POA: Diagnosis not present

## 2015-11-30 DIAGNOSIS — E785 Hyperlipidemia, unspecified: Secondary | ICD-10-CM

## 2015-11-30 DIAGNOSIS — R011 Cardiac murmur, unspecified: Secondary | ICD-10-CM

## 2015-11-30 DIAGNOSIS — R0989 Other specified symptoms and signs involving the circulatory and respiratory systems: Secondary | ICD-10-CM | POA: Diagnosis not present

## 2015-11-30 DIAGNOSIS — I779 Disorder of arteries and arterioles, unspecified: Secondary | ICD-10-CM

## 2015-11-30 DIAGNOSIS — I2583 Coronary atherosclerosis due to lipid rich plaque: Secondary | ICD-10-CM

## 2015-11-30 NOTE — Assessment & Plan Note (Signed)
istory of hyperlipidemia on statin therapy with recent lipid profile performed 09/17/15 revealed a total cholesterol 104, LDL 50 and HDL of 29.

## 2015-11-30 NOTE — Progress Notes (Signed)
11/30/2015 [name]   30-Apr-1946  [id number]  Primary Physician Diane Fraction, MD Primary Cardiologist: Diane Harp MD [name]   HPI:  The patient is a 70 year old moderately overweight single Caucasian female with no children whom I last saw in the office 02/10/13. She saw [name] recently for followup of sleep apnea and CPAP. She has a history of CAD, status post bypass grafting x5 on May 22, 2010, by Dr. Ceasar [name] with a LIMA to her LAD, sequential vein to a ramus branch, circumflex, and the PDA and posterolateral branch as well as to the right coronary artery. Her postoperative course was complicated by paroxysmal AFib. A loop recorder implanted by Dr. Elisabeth [name] continues to be interrogated without episodes. She does have PVOD with occluded SFAs bilaterally and 50% left renal artery stenosis as well as moderate left common femoral disease. She does have lifestyle-limiting claudication but this is not _____. She has chronic renal insufficiency followed by Dr. Posey [name] as well as hypertension, hyperlipidemia, and remote tobacco abuse, having quit 2 years ago. She also has mild right and moderate left ICA stenosis and neurologically asymptomatic. She has had 2 episodes of chest pain the past 2 mornings as well as dyspnea. She had a Myoview performed August 13, 2010, after her bypass surgery that showed inferolateral scar toward the base without ischemia.  Since I saw her 3 years ago she's remained stable. She did see [name] earlier this year for her loop recorder which has since ceased to work. This was placed because of atrial tachycardia. She denies chest pain or shortness of breath.   Current Outpatient Prescriptions  Medication Sig Dispense Refill  . acetaminophen (TYLENOL) 325 MG tablet Take 650 mg by mouth every 6 (six) hours as needed.    [name] amLODipine (NORVASC) 10 MG tablet Take 10 mg by mouth daily.      [name] aspirin 81 MG tablet Take 81 mg by  mouth daily.     . B-D ULTRAFINE III SHORT PEN 31G X 8 MM MISC USE AS DIRECTED 100 each 2  . clopidogrel (PLAVIX) 75 MG tablet Take 1 tablet (75 mg total) by mouth daily. 30 tablet 6  . [facility] VITAMIN D 2000 units CAPS TAKE ONE CAPSULE BY MOUTH DAILY 30 capsule 11  . Esomeprazole Magnesium (NEXIUM PO) Take 1 tablet by mouth daily.    . furosemide (LASIX) 40 MG tablet Take 1 tablet (40 mg total) by mouth daily. KEEP OV. 30 tablet 1  . furosemide (LASIX) 40 MG tablet Take 1 tablet (40 mg total) by mouth daily. 30 tablet 6  . hydrALAZINE (APRESOLINE) 25 MG tablet Take 25 mg by mouth as needed. Takes only when BP is above 140/80    . hydrALAZINE (APRESOLINE) 25 MG tablet Take 1 tablet (25 mg total) by mouth as needed. 30 tablet 6  . Insulin Glargine (LANTUS SOLOSTAR) 100 UNIT/ML Solostar Pen INJECT 80 UNITS 2 TIMES DAILY 60 pen 4  . IRON PO Take 73 mg elemental calcium/kg/hr by mouth daily.    [name] LANTUS SOLOSTAR 100 UNIT/ML Solostar Pen INJECT 80 UNITS 2 TIMES DAILY 45 pen 3  . linagliptin (TRADJENTA) 5 MG TABS tablet Take 1 tablet (5 mg total) by mouth daily. 90 tablet 4  . lisinopril (PRINIVIL,ZESTRIL) 20 MG tablet Take 1 tablet (20 mg total) by mouth daily. 90 tablet 4  . metoCLOPramide (REGLAN) 5 MG tablet TAKE 1 TABLET BY MOUTH 3 TIMES A DAY BEFORE MEALS  AND 1 AT BEDTIME 120 tablet 5  . niacin (NIASPAN) 750 MG CR tablet TAKE 1 TABLET BY MOUTH EVERY DAY AT BEDTIME 90 tablet 3  . nitroGLYCERIN (NITROSTAT) 0.4 MG SL tablet Place 0.4 mg under the tongue every 5 (five) minutes as needed.    . Omega-3 Fatty Acids (FISH OIL) 1200 MG CPDR Take 2 tablets by mouth at bedtime.    . rosuvastatin (CRESTOR) 10 MG tablet TAKE 1 TABLET (10 MG TOTAL) BY MOUTH DAILY. 90 tablet 3   No current facility-administered medications for this visit.    Allergies  Allergen Reactions  . Latex   . Neosporin [Neomycin-Polymyxin-Gramicidin]   . Sulfa Antibiotics Nausea And Vomiting    Social History   Social History    . Marital Status: Single    Spouse Name: N/A  . Number of Children: 0  . Years of Education: N/A   Occupational History  . Retired    Social History Main Topics  . Smoking status: Former Smoker    Quit date: 01/22/2010  . Smokeless tobacco: Never Used  . Alcohol Use: No  . Drug Use: No  . Sexual Activity: Not on file   Other Topics Concern  . Not on file   Social History Narrative     Review of Systems: General: negative for chills, fever, night sweats or weight changes.  Cardiovascular: negative for chest pain, dyspnea on exertion, edema, orthopnea, palpitations, paroxysmal nocturnal dyspnea or shortness of breath Dermatological: negative for rash Respiratory: negative for cough or wheezing Urologic: negative for hematuria Abdominal: negative for nausea, vomiting, diarrhea, bright red blood per rectum, melena, or hematemesis Neurologic: negative for visual changes, syncope, or dizziness All other systems reviewed and are otherwise negative except as noted above.    Blood pressure 154/50, pulse 91, height '4\' 10"'$  (1.473 m), weight 231 lb (104.781 kg).  General appearance: alert and no distress Neck: no adenopathy, no carotid bruit, no JVD, supple, symmetrical, trachea midline and thyroid not enlarged, symmetric, no tenderness/mass/nodules Lungs: clear to auscultation bilaterally Heart: regular rate and rhythm, S1, S2 normal, no murmur, click, rub or gallop Extremities: extremities normal, atraumatic, no cyanosis or edema  EKG not performed today  ASSESSMENT AND PLAN:   Hyperlipidemia istory of hyperlipidemia on statin therapy with recent lipid profile performed 09/17/15 revealed a total cholesterol 104, LDL 50 and HDL of 29.  Hypertension istory of hypertension blood pressure mattress at 154/50. She is on amlodipine, hydralazine and lisinopril. Continue current meds at current dosing  Systolic murmur History of a soft systolic murmur probably related to mild aortic  stenosis by 2-D echo several years ago  Peripheral artery disease History of peripheral arterial disease with known occluded SFAs bilaterally as well as left common femoral artery stenosis. She does have lifestyle limiting claudication but really does not ambulate much outside of the house. At this point, I'm not inclined to pursue this further.  CAD (coronary artery disease) CABG x5 2011 History of CAD status post coronary artery bypass graftingbX5 y Dr. Servando Snare October 2011 with a LIMA to the LAD, sequential vein to an intermediate branch and circumflex as well as the PDA and PLA Her last Myoview stress test performed 08/13/10 showed inferolateral scar towards the base without ischemia. She denies chest pain.  Bilateral carotid artery disease (HCC) History of bilateral ICA stenosis left greater than right. Her last Doppler study performed 3/11/14revealed this. She has not had a Doppler since. She has had a stroke. I am going to  repeat carotid Doppler studies.      Diane Harp MD FACP,FACC,FAHA, [facility] 11/30/2015 11:05 AM

## 2015-11-30 NOTE — Assessment & Plan Note (Signed)
History of peripheral arterial disease with known occluded SFAs bilaterally as well as left common femoral artery stenosis. She does have lifestyle limiting claudication but really does not ambulate much outside of the house. At this point, I'm not inclined to pursue this further.

## 2015-11-30 NOTE — Assessment & Plan Note (Signed)
istory of hypertension blood pressure mattress at 154/50. She is on amlodipine, hydralazine and lisinopril. Continue current meds at current dosing

## 2015-11-30 NOTE — Patient Instructions (Addendum)
Medication Instructions:  Your physician recommends that you continue on your current medications as directed. Please refer to the Current Medication list given to you today.   Labwork: None  Testing/Procedures: Your physician has requested that you have a carotid duplex. This test is an ultrasound of the carotid arteries in your neck. It looks at blood flow through these arteries that supply the brain with blood. Allow one hour for this exam. There are no restrictions or special instructions.    Follow-Up: Your physician wants you to follow-up in: 12 months with Dr. Gwenlyn Found. You will receive a reminder letter in the mail two months in advance. If you don't receive a letter, please call our office to schedule the follow-up appointment.   Any Other Special Instructions Will Be Listed Below (If Applicable).     If you need a refill on your cardiac medications before your next appointment, please call your pharmacy.

## 2015-11-30 NOTE — Assessment & Plan Note (Signed)
History of a soft systolic murmur probably related to mild aortic stenosis by 2-D echo several years ago

## 2015-11-30 NOTE — Assessment & Plan Note (Signed)
History of CAD status post coronary artery bypass graftingbX5 y Dr. Servando Snare October 2011 with a LIMA to the LAD, sequential vein to an intermediate branch and circumflex as well as the PDA and PLA Her last Myoview stress test performed 08/13/10 showed inferolateral scar towards the base without ischemia. She denies chest pain.

## 2015-11-30 NOTE — Assessment & Plan Note (Signed)
History of bilateral ICA stenosis left greater than right. Her last Doppler study performed 3/11/14revealed this. She has not had a Doppler since. She has had a stroke. I am going to repeat carotid Doppler studies.

## 2015-12-10 ENCOUNTER — Ambulatory Visit (HOSPITAL_COMMUNITY)
Admission: RE | Admit: 2015-12-10 | Discharge: 2015-12-10 | Disposition: A | Discharge status: Home / Self Care | Payer: Medicare Other | Source: Ambulatory Visit | Attending: Cardiology | Admitting: Cardiology

## 2015-12-10 DIAGNOSIS — G4733 Obstructive sleep apnea (adult) (pediatric): Secondary | ICD-10-CM | POA: Insufficient documentation

## 2015-12-10 DIAGNOSIS — R0989 Other specified symptoms and signs involving the circulatory and respiratory systems: Secondary | ICD-10-CM | POA: Insufficient documentation

## 2015-12-10 DIAGNOSIS — Z6841 Body Mass Index (BMI) 40.0 and over, adult: Secondary | ICD-10-CM | POA: Diagnosis not present

## 2015-12-10 DIAGNOSIS — I2510 Atherosclerotic heart disease of native coronary artery without angina pectoris: Secondary | ICD-10-CM | POA: Insufficient documentation

## 2015-12-10 DIAGNOSIS — E669 Obesity, unspecified: Secondary | ICD-10-CM | POA: Insufficient documentation

## 2015-12-10 DIAGNOSIS — E785 Hyperlipidemia, unspecified: Secondary | ICD-10-CM | POA: Insufficient documentation

## 2015-12-10 DIAGNOSIS — N189 Chronic kidney disease, unspecified: Secondary | ICD-10-CM | POA: Insufficient documentation

## 2015-12-10 DIAGNOSIS — E1122 Type 2 diabetes mellitus with diabetic chronic kidney disease: Secondary | ICD-10-CM | POA: Insufficient documentation

## 2015-12-10 DIAGNOSIS — I6523 Occlusion and stenosis of bilateral carotid arteries: Secondary | ICD-10-CM | POA: Insufficient documentation

## 2015-12-10 DIAGNOSIS — I129 Hypertensive chronic kidney disease with stage 1 through stage 4 chronic kidney disease, or unspecified chronic kidney disease: Secondary | ICD-10-CM | POA: Diagnosis not present

## 2016-01-09 ENCOUNTER — Other Ambulatory Visit: Payer: Self-pay | Admitting: Family Medicine

## 2016-01-14 DIAGNOSIS — N189 Chronic kidney disease, unspecified: Secondary | ICD-10-CM | POA: Diagnosis not present

## 2016-01-14 DIAGNOSIS — E889 Metabolic disorder, unspecified: Secondary | ICD-10-CM | POA: Diagnosis not present

## 2016-01-14 DIAGNOSIS — N183 Chronic kidney disease, stage 3 (moderate): Secondary | ICD-10-CM | POA: Diagnosis not present

## 2016-01-18 DIAGNOSIS — E889 Metabolic disorder, unspecified: Secondary | ICD-10-CM | POA: Diagnosis not present

## 2016-01-18 DIAGNOSIS — D631 Anemia in chronic kidney disease: Secondary | ICD-10-CM | POA: Diagnosis not present

## 2016-01-18 DIAGNOSIS — I10 Essential (primary) hypertension: Secondary | ICD-10-CM | POA: Diagnosis not present

## 2016-01-18 DIAGNOSIS — M9080 Osteopathy in diseases classified elsewhere, unspecified site: Secondary | ICD-10-CM | POA: Diagnosis not present

## 2016-01-18 DIAGNOSIS — N183 Chronic kidney disease, stage 3 (moderate): Secondary | ICD-10-CM | POA: Diagnosis not present

## 2016-01-18 DIAGNOSIS — E669 Obesity, unspecified: Secondary | ICD-10-CM | POA: Diagnosis not present

## 2016-03-05 ENCOUNTER — Other Ambulatory Visit: Payer: Self-pay | Admitting: Family Medicine

## 2016-03-18 ENCOUNTER — Ambulatory Visit (INDEPENDENT_AMBULATORY_CARE_PROVIDER_SITE_OTHER): Payer: Medicare Other | Admitting: Family Medicine

## 2016-03-18 VITALS — BP 130/60 | HR 80 | Temp 97.9°F | Resp 16 | Ht <= 58 in | Wt 225.0 lb

## 2016-03-18 DIAGNOSIS — E785 Hyperlipidemia, unspecified: Secondary | ICD-10-CM | POA: Diagnosis not present

## 2016-03-18 DIAGNOSIS — E1122 Type 2 diabetes mellitus with diabetic chronic kidney disease: Secondary | ICD-10-CM | POA: Diagnosis not present

## 2016-03-18 DIAGNOSIS — Z23 Encounter for immunization: Secondary | ICD-10-CM

## 2016-03-18 DIAGNOSIS — Z1231 Encounter for screening mammogram for malignant neoplasm of breast: Secondary | ICD-10-CM | POA: Diagnosis not present

## 2016-03-18 DIAGNOSIS — I10 Essential (primary) hypertension: Secondary | ICD-10-CM

## 2016-03-18 DIAGNOSIS — I2510 Atherosclerotic heart disease of native coronary artery without angina pectoris: Secondary | ICD-10-CM | POA: Diagnosis not present

## 2016-03-18 DIAGNOSIS — I2583 Coronary atherosclerosis due to lipid rich plaque: Secondary | ICD-10-CM

## 2016-03-18 DIAGNOSIS — E2839 Other primary ovarian failure: Secondary | ICD-10-CM

## 2016-03-18 DIAGNOSIS — Z1239 Encounter for other screening for malignant neoplasm of breast: Secondary | ICD-10-CM

## 2016-03-18 LAB — CBC WITH DIFFERENTIAL/PLATELET
BASOS ABS: 0 {cells}/uL (ref 0–200)
Basophils Relative: 0 %
EOS ABS: 166 {cells}/uL (ref 15–500)
Eosinophils Relative: 2 %
HCT: 43.2 % (ref 35.0–45.0)
HEMOGLOBIN: 13.7 g/dL (ref 12.0–15.0)
Lymphs Abs: 1245 cells/uL (ref 850–3900)
MCH: 30.3 pg (ref 27.0–33.0)
MCHC: 31.7 g/dL — AB (ref 32.0–36.0)
MCV: 95.6 fL (ref 80.0–100.0)
MONO ABS: 581 {cells}/uL (ref 200–950)
MONOS PCT: 7 %
MPV: 10.5 fL (ref 7.5–12.5)
NEUTROS ABS: 6308 {cells}/uL (ref 1500–7800)
Neutrophils Relative %: 76 %
Platelets: 157 10*3/uL (ref 140–400)
RBC: 4.52 MIL/uL (ref 3.80–5.10)
RDW: 15 % (ref 11.0–15.0)
WBC: 8.3 10*3/uL (ref 3.8–10.8)

## 2016-03-18 LAB — LIPID PANEL
CHOL/HDL RATIO: 3.2 ratio (ref <=5.0)
Cholesterol: 111 mg/dL — ABNORMAL LOW (ref 125–200)
HDL: 35 mg/dL — AB (ref >=46)
LDL CALC: 51 mg/dL (ref <130)
TRIGLYCERIDES: 123 mg/dL (ref <150)
VLDL: 25 mg/dL (ref <30)

## 2016-03-18 LAB — COMPLETE METABOLIC PANEL WITH GFR
ALT: 8 U/L (ref 6–29)
AST: 14 U/L (ref 10–35)
Albumin: 3.6 g/dL (ref 3.6–5.1)
Alkaline Phosphatase: 71 U/L (ref 33–130)
BUN: 24 mg/dL (ref 7–25)
CHLORIDE: 100 mmol/L (ref 98–110)
CO2: 32 mmol/L — AB (ref 20–31)
CREATININE: 1.06 mg/dL — AB (ref 0.60–0.93)
Calcium: 8.8 mg/dL (ref 8.6–10.4)
GFR, EST AFRICAN AMERICAN: 61 mL/min (ref >=60)
GFR, Est Non African American: 53 mL/min — ABNORMAL LOW (ref >=60)
Glucose, Bld: 112 mg/dL — ABNORMAL HIGH (ref 70–99)
Potassium: 4.1 mmol/L (ref 3.5–5.3)
Sodium: 143 mmol/L (ref 135–146)
Total Bilirubin: 0.3 mg/dL (ref 0.2–1.2)
Total Protein: 6.6 g/dL (ref 6.1–8.1)

## 2016-03-18 NOTE — Addendum Note (Signed)
Addended by: Shary Decamp B on: 03/18/2016 11:50 AM   Modules accepted: Orders

## 2016-03-18 NOTE — Progress Notes (Signed)
Subjective:    Patient ID: [name], female    DOB: July 24, 1946, 70 y.o.   MRN: [id number]  HPI 11/14/14 Patient is currently on Lantus 80 units subcutaneous twice a day. Recently I started the patient on Trajenta 5 mg poqday to help address her two-hour postprandial sugars. She is here today for recheck. Fasting blood sugars are typically 100-140 with a vast majority being less than 130. She is not checking any two-hour postprandial sugars. She continues to graze throughout the day and eat constantly rather than eating 3 distinct meals. She is also not exercising regularly.  At that time, my plan was:  Fasting blood sugars seem acceptable however I'm concerned about her two-hour postprandial sugars. I have asked her to report her two-hour postprandial sugars to me in one week. I have set a goal of less than 180 for this patient. If they're greater than 180, I would start mealtime insulin using NovoLog.  09/18/15  patient is here today for follow-up. Her blood pressure is excellent 130/60. She denies any episodes of hypoglycemia. She denies any polyuria, polydipsia, or blurred vision.At that time, my plan was:  Blood pressure is excellent. Chronic kidney disease is stable. Hemoglobin A1c is outstanding. Cholesterol is acceptable. Patient does have low HDL cholesterol and I recommended increasing aerobic exercise and try to lose weight. Otherwise I'm very happy with her lab work and will make no changes in her medication at this time.  03/18/16 Patient is here for follow-up. She is overdue for mammogram. She is also due for a bone density test. Her last colonoscopy was 2 years ago. She is due for Prevnar 13. She is also due for an eye exam. Diabetic foot exam is performed today and is significant for thick yellow dystrophic toenails. 10 g monofilament test is felt in 5 out of 5 locations. However mother patient has very weak pulses in both feet due to her known peripheral vascular disease. Her  blood sugars are typically 110-120 every morning. She is very consistent. She denies frequent hypoglycemic episodes. She denies any chest pain shortness of breath or dyspnea on exertion. She denies any myalgias or right upper quadrant pain. Past Medical History:  Diagnosis Date  . Atrial fibrillation (HCC)    paroxysmal  . CAD (coronary artery disease)    X5  . Chronic kidney disease   . Diabetes mellitus   . Gastritis   . GI bleeding   . Hiatal hernia   . Hyperglycemia   . Hyperlipidemia   . Hypertension   . Obesity   . Obstructive sleep apnea   . Peripheral artery disease (HCC)    lower extremities  . Stroke ([place])   . Systolic murmur   . Ulcer   . Vitamin D deficiency    Past Surgical History:  Procedure Laterality Date  . ANGIOPLASTY / STENTING FEMORAL Right    leg  . Carotid doppler  10/13/2011   right ICA-49% diameter reduction and left ICA with 50%-69% diameter reduction  . CORONARY ARTERY BYPASS GRAFT  05/22/2010  . Heart bypass    . Myoview Stress  08/13/2010   inferolateral scar without ischemia   Current Outpatient Prescriptions on File Prior to Visit  Medication Sig Dispense Refill  . acetaminophen (TYLENOL) 325 MG tablet Take 650 mg by mouth every 6 (six) hours as needed.    [name] amLODipine (NORVASC) 10 MG tablet Take 10 mg by mouth daily.      [name] aspirin 81 MG tablet  Take 81 mg by mouth daily.     . B-D ULTRAFINE III SHORT PEN 31G X 8 MM MISC USE AS DIRECTED 100 each 2  . clopidogrel (PLAVIX) 75 MG tablet Take 1 tablet (75 mg total) by mouth daily. 30 tablet 6  . CVS VITAMIN D 2000 units CAPS TAKE ONE CAPSULE BY MOUTH DAILY 30 capsule 11  . furosemide (LASIX) 40 MG tablet Take 1 tablet (40 mg total) by mouth daily. KEEP OV. 30 tablet 1  . furosemide (LASIX) 40 MG tablet Take 1 tablet (40 mg total) by mouth daily. 30 tablet 6  . hydrALAZINE (APRESOLINE) 25 MG tablet Take 25 mg by mouth as needed. Takes only when BP is above 140/80    . hydrALAZINE (APRESOLINE) 25  MG tablet Take 1 tablet (25 mg total) by mouth as needed. 30 tablet 6  . Insulin Glargine (LANTUS SOLOSTAR) 100 UNIT/ML Solostar Pen INJECT 80 UNITS 2 TIMES DAILY 60 pen 4  . IRON PO Take 73 mg elemental calcium/kg/hr by mouth daily.    [name] LANTUS SOLOSTAR 100 UNIT/ML Solostar Pen INJECT 80 UNITS SUBQ 2 TIMES DAILY 45 pen 3  . linagliptin (TRADJENTA) 5 MG TABS tablet Take 1 tablet (5 mg total) by mouth daily. 90 tablet 4  . lisinopril (PRINIVIL,ZESTRIL) 20 MG tablet Take 1 tablet (20 mg total) by mouth daily. 90 tablet 4  . metoCLOPramide (REGLAN) 5 MG tablet TAKE 1 TABLET BY MOUTH 3 TIMES A DAY BEFORE MEALS AND 1 AT BEDTIME 120 tablet 5  . niacin (NIASPAN) 750 MG CR tablet TAKE 1 TABLET BY MOUTH EVERY DAY AT BEDTIME 90 tablet 3  . nitroGLYCERIN (NITROSTAT) 0.4 MG SL tablet Place 0.4 mg under the tongue every 5 (five) minutes as needed.    . Omega-3 Fatty Acids (FISH OIL) 1200 MG CPDR Take 2 tablets by mouth at bedtime.    . rosuvastatin (CRESTOR) 10 MG tablet TAKE 1 TABLET (10 MG TOTAL) BY MOUTH DAILY. 90 tablet 3   No current facility-administered medications on file prior to visit.    Allergies  Allergen Reactions  . Latex   . Neosporin [Neomycin-Polymyxin-Gramicidin]   . Sulfa Antibiotics Nausea And Vomiting   Social History   Social History  . Marital status: Single    Spouse name: N/A  . Number of children: 0  . Years of education: N/A   Occupational History  . Retired    Social History Main Topics  . Smoking status: Former Smoker    Quit date: 01/22/2010  . Smokeless tobacco: Never Used  . Alcohol use No  . Drug use: No  . Sexual activity: Not on file   Other Topics Concern  . Not on file   Social History Narrative  . No narrative on file     Review of Systems  All other systems reviewed and are negative.      Objective:   Physical Exam  Constitutional: She appears well-developed and well-nourished.  Cardiovascular: Normal rate, regular rhythm and normal  heart sounds.   Pulmonary/Chest: Effort normal and breath sounds normal. No respiratory distress. She has no wheezes. She has no rales.  Abdominal: Soft. Bowel sounds are normal.  Vitals reviewed.         Assessment & Plan:  Essential hypertension  Coronary artery disease due to lipid rich plaque  Type 2 diabetes mellitus with diabetic chronic kidney disease, unspecified CKD stage, unspecified long term insulin use status ([place]) - Plan: CBC with Differential/Platelet, COMPLETE METABOLIC  PANEL WITH GFR, Lipid panel, Microalbumin, urine, Hemoglobin A1c, Ambulatory referral to Ophthalmology  Hyperlipidemia  Breast cancer screening, high risk patient - Plan: MM Digital Screening  Estrogen deficiency - Plan: DG Bone Density I will schedule the patient for mammogram as well as a bone density test. I will also schedule her for an eye exam. She received Prevnar 13 today. I will check a fasting lab panel. Goal LDL cholesterol is less than 70 given her peripheral vascular disease. No hemoglobin A1c is less than 7. Her blood pressure today is acceptable.

## 2016-03-19 LAB — MICROALBUMIN, URINE: Microalb, Ur: 147.6 mg/dL

## 2016-03-19 LAB — HEMOGLOBIN A1C
HEMOGLOBIN A1C: 5.9 % — AB (ref <5.7)
Mean Plasma Glucose: 123 mg/dL

## 2016-03-29 ENCOUNTER — Other Ambulatory Visit: Payer: Self-pay | Admitting: Family Medicine

## 2016-03-29 DIAGNOSIS — Z1231 Encounter for screening mammogram for malignant neoplasm of breast: Secondary | ICD-10-CM

## 2016-04-09 ENCOUNTER — Ambulatory Visit
Admission: RE | Admit: 2016-04-09 | Discharge: 2016-04-09 | Disposition: A | Discharge status: Home / Self Care | Payer: Medicare Other | Source: Ambulatory Visit | Attending: Family Medicine | Admitting: Family Medicine

## 2016-04-09 ENCOUNTER — Other Ambulatory Visit: Payer: Self-pay | Admitting: Family Medicine

## 2016-04-09 DIAGNOSIS — N6459 Other signs and symptoms in breast: Secondary | ICD-10-CM

## 2016-04-09 DIAGNOSIS — M85851 Other specified disorders of bone density and structure, right thigh: Secondary | ICD-10-CM | POA: Diagnosis not present

## 2016-04-09 DIAGNOSIS — E2839 Other primary ovarian failure: Secondary | ICD-10-CM

## 2016-04-09 DIAGNOSIS — N6489 Other specified disorders of breast: Secondary | ICD-10-CM | POA: Diagnosis not present

## 2016-04-09 DIAGNOSIS — Z780 Asymptomatic menopausal state: Secondary | ICD-10-CM | POA: Diagnosis not present

## 2016-04-09 DIAGNOSIS — Z1231 Encounter for screening mammogram for malignant neoplasm of breast: Secondary | ICD-10-CM

## 2016-04-09 DIAGNOSIS — R928 Other abnormal and inconclusive findings on diagnostic imaging of breast: Secondary | ICD-10-CM | POA: Diagnosis not present

## 2016-04-09 NOTE — Progress Notes (Signed)
Pt at breast center for screening mammogram.  [facility], pt has inverted left nipple.  Asking for order for Diagnostic mammogram with ultrasounds as needed.  Orders placed

## 2016-04-10 ENCOUNTER — Encounter: Payer: Self-pay | Admitting: Family Medicine

## 2016-04-11 ENCOUNTER — Ambulatory Visit
Admission: RE | Admit: 2016-04-11 | Discharge: 2016-04-11 | Disposition: A | Discharge status: Home / Self Care | Payer: Medicare Other | Source: Ambulatory Visit | Attending: Family Medicine | Admitting: Family Medicine

## 2016-04-11 DIAGNOSIS — N6012 Diffuse cystic mastopathy of left breast: Secondary | ICD-10-CM | POA: Diagnosis not present

## 2016-04-11 DIAGNOSIS — N6459 Other signs and symptoms in breast: Secondary | ICD-10-CM

## 2016-04-11 DIAGNOSIS — R928 Other abnormal and inconclusive findings on diagnostic imaging of breast: Secondary | ICD-10-CM | POA: Diagnosis not present

## 2016-04-26 ENCOUNTER — Encounter: Payer: Self-pay | Admitting: Family Medicine

## 2016-05-08 DIAGNOSIS — Z961 Presence of intraocular lens: Secondary | ICD-10-CM | POA: Diagnosis not present

## 2016-05-08 DIAGNOSIS — D3132 Benign neoplasm of left choroid: Secondary | ICD-10-CM | POA: Diagnosis not present

## 2016-05-08 DIAGNOSIS — Z9841 Cataract extraction status, right eye: Secondary | ICD-10-CM | POA: Diagnosis not present

## 2016-05-08 DIAGNOSIS — H43811 Vitreous degeneration, right eye: Secondary | ICD-10-CM | POA: Diagnosis not present

## 2016-05-08 LAB — HM DIABETES EYE EXAM

## 2016-05-21 ENCOUNTER — Encounter: Payer: Self-pay | Admitting: Family Medicine

## 2016-06-03 ENCOUNTER — Other Ambulatory Visit: Payer: Self-pay | Admitting: Family Medicine

## 2016-06-09 ENCOUNTER — Other Ambulatory Visit: Payer: Self-pay | Admitting: Cardiovascular Disease

## 2016-06-09 NOTE — Telephone Encounter (Signed)
Rx request sent to pharmacy.  

## 2016-07-05 ENCOUNTER — Other Ambulatory Visit: Payer: Self-pay | Admitting: Cardiovascular Disease

## 2016-07-07 DIAGNOSIS — H26493 Other secondary cataract, bilateral: Secondary | ICD-10-CM | POA: Diagnosis not present

## 2016-07-07 NOTE — Telephone Encounter (Signed)
Rx(s) sent to pharmacy electronically.  

## 2016-07-09 ENCOUNTER — Other Ambulatory Visit: Payer: Self-pay | Admitting: Family Medicine

## 2016-07-10 ENCOUNTER — Other Ambulatory Visit: Payer: Self-pay

## 2016-07-14 DIAGNOSIS — N183 Chronic kidney disease, stage 3 (moderate): Secondary | ICD-10-CM | POA: Diagnosis not present

## 2016-07-14 DIAGNOSIS — E889 Metabolic disorder, unspecified: Secondary | ICD-10-CM | POA: Diagnosis not present

## 2016-07-21 ENCOUNTER — Other Ambulatory Visit: Payer: Self-pay | Admitting: Family Medicine

## 2016-07-21 NOTE — Telephone Encounter (Signed)
Medication refilled per protocol. 

## 2016-07-23 DIAGNOSIS — Z23 Encounter for immunization: Secondary | ICD-10-CM | POA: Diagnosis not present

## 2016-07-23 DIAGNOSIS — I10 Essential (primary) hypertension: Secondary | ICD-10-CM | POA: Diagnosis not present

## 2016-07-23 DIAGNOSIS — N183 Chronic kidney disease, stage 3 (moderate): Secondary | ICD-10-CM | POA: Diagnosis not present

## 2016-07-23 DIAGNOSIS — Z6841 Body Mass Index (BMI) 40.0 and over, adult: Secondary | ICD-10-CM | POA: Diagnosis not present

## 2016-07-23 DIAGNOSIS — E889 Metabolic disorder, unspecified: Secondary | ICD-10-CM | POA: Diagnosis not present

## 2016-07-23 DIAGNOSIS — M9080 Osteopathy in diseases classified elsewhere, unspecified site: Secondary | ICD-10-CM | POA: Diagnosis not present

## 2016-07-23 DIAGNOSIS — D631 Anemia in chronic kidney disease: Secondary | ICD-10-CM | POA: Diagnosis not present

## 2016-08-07 ENCOUNTER — Other Ambulatory Visit: Payer: Self-pay | Admitting: *Deleted

## 2016-08-07 MED ORDER — METOCLOPRAMIDE HCL 5 MG PO TABS: ORAL_TABLET | ORAL | 1 refills | Status: DC

## 2016-08-07 NOTE — Telephone Encounter (Signed)
Received fax requesting refill on Reglan.   Refill appropriate and filled per protocol.

## 2016-08-14 ENCOUNTER — Other Ambulatory Visit: Payer: Self-pay | Admitting: Family Medicine

## 2016-09-24 ENCOUNTER — Telehealth: Payer: Self-pay | Admitting: Family Medicine

## 2016-09-24 MED ORDER — INSULIN DETEMIR 100 UNIT/ML FLEXPEN: 80.0000 [IU] | PEN_INJECTOR | Freq: Two times a day (BID) | SUBCUTANEOUS | 3 refills | Status: DC

## 2016-09-24 NOTE — Telephone Encounter (Signed)
[name] called and b/c of ins needs to change Lantus to Levemir - Changed made and sent to pharm.

## 2016-10-26 ENCOUNTER — Other Ambulatory Visit: Payer: Self-pay | Admitting: Family Medicine

## 2016-12-19 ENCOUNTER — Other Ambulatory Visit: Payer: Self-pay | Admitting: Family Medicine

## 2016-12-28 ENCOUNTER — Other Ambulatory Visit: Payer: Self-pay | Admitting: Family Medicine

## 2017-01-10 ENCOUNTER — Other Ambulatory Visit: Payer: Self-pay | Admitting: Cardiovascular Disease

## 2017-01-23 ENCOUNTER — Other Ambulatory Visit: Payer: Self-pay | Admitting: Family Medicine

## 2017-01-25 ENCOUNTER — Other Ambulatory Visit: Payer: Self-pay | Admitting: Family Medicine

## 2017-02-08 ENCOUNTER — Other Ambulatory Visit: Payer: Self-pay | Admitting: Cardiovascular Disease

## 2017-02-08 ENCOUNTER — Other Ambulatory Visit: Payer: Self-pay | Admitting: Family Medicine

## 2017-03-10 ENCOUNTER — Other Ambulatory Visit: Payer: Self-pay | Admitting: Cardiovascular Disease

## 2017-04-06 ENCOUNTER — Other Ambulatory Visit: Payer: Self-pay | Admitting: Cardiovascular Disease

## 2017-04-07 NOTE — Telephone Encounter (Signed)
REFILL 

## 2017-04-15 ENCOUNTER — Other Ambulatory Visit: Payer: Self-pay | Admitting: Cardiovascular Disease

## 2017-04-18 ENCOUNTER — Other Ambulatory Visit: Payer: Self-pay | Admitting: Family Medicine

## 2017-04-18 ENCOUNTER — Other Ambulatory Visit: Payer: Self-pay | Admitting: Cardiovascular Disease

## 2017-04-20 NOTE — Telephone Encounter (Signed)
Rx(s) sent to pharmacy electronically.  

## 2017-04-20 NOTE — Telephone Encounter (Signed)
Medication refill for one time only.  Patient needs to be seen.  Letter sent for patient to call and schedule.  Pt has not been seen in over 1 year.

## 2017-04-22 ENCOUNTER — Other Ambulatory Visit: Payer: Self-pay | Admitting: Family Medicine

## 2017-04-22 DIAGNOSIS — I10 Essential (primary) hypertension: Secondary | ICD-10-CM

## 2017-04-22 DIAGNOSIS — Z794 Long term (current) use of insulin: Secondary | ICD-10-CM

## 2017-04-22 DIAGNOSIS — Z79899 Other long term (current) drug therapy: Secondary | ICD-10-CM

## 2017-04-22 DIAGNOSIS — I25810 Atherosclerosis of coronary artery bypass graft(s) without angina pectoris: Secondary | ICD-10-CM

## 2017-04-22 DIAGNOSIS — E785 Hyperlipidemia, unspecified: Secondary | ICD-10-CM

## 2017-04-22 DIAGNOSIS — E1122 Type 2 diabetes mellitus with diabetic chronic kidney disease: Secondary | ICD-10-CM

## 2017-04-23 ENCOUNTER — Other Ambulatory Visit: Payer: Medicare Other

## 2017-04-23 DIAGNOSIS — Z79899 Other long term (current) drug therapy: Secondary | ICD-10-CM

## 2017-04-23 DIAGNOSIS — E785 Hyperlipidemia, unspecified: Secondary | ICD-10-CM | POA: Diagnosis not present

## 2017-04-23 DIAGNOSIS — E1122 Type 2 diabetes mellitus with diabetic chronic kidney disease: Secondary | ICD-10-CM | POA: Diagnosis not present

## 2017-04-23 DIAGNOSIS — I10 Essential (primary) hypertension: Secondary | ICD-10-CM

## 2017-04-23 DIAGNOSIS — I25810 Atherosclerosis of coronary artery bypass graft(s) without angina pectoris: Secondary | ICD-10-CM

## 2017-04-23 DIAGNOSIS — Z794 Long term (current) use of insulin: Secondary | ICD-10-CM

## 2017-04-24 LAB — CBC WITH DIFFERENTIAL/PLATELET
BASOS PCT: 0.7 %
Basophils Absolute: 60 cells/uL (ref 0–200)
EOS ABS: 170 {cells}/uL (ref 15–500)
Eosinophils Relative: 2 %
HEMATOCRIT: 40.1 % (ref 35.0–45.0)
HEMOGLOBIN: 13.3 g/dL (ref 11.7–15.5)
Lymphs Abs: 1972 cells/uL (ref 850–3900)
MCH: 31.2 pg (ref 27.0–33.0)
MCHC: 33.2 g/dL (ref 32.0–36.0)
MCV: 94.1 fL (ref 80.0–100.0)
MPV: 11.5 fL (ref 7.5–12.5)
Monocytes Relative: 7.9 %
NEUTROS ABS: 5627 {cells}/uL (ref 1500–7800)
Neutrophils Relative %: 66.2 %
Platelets: 155 10*3/uL (ref 140–400)
RBC: 4.26 10*6/uL (ref 3.80–5.10)
RDW: 13 % (ref 11.0–15.0)
TOTAL LYMPHOCYTE: 23.2 %
WBC: 8.5 10*3/uL (ref 3.8–10.8)
WBCMIX: 672 {cells}/uL (ref 200–950)

## 2017-04-24 LAB — COMPLETE METABOLIC PANEL WITH GFR
AG Ratio: 1.3 (calc) (ref 1.0–2.5)
ALBUMIN MSPROF: 3.5 g/dL — AB (ref 3.6–5.1)
ALKALINE PHOSPHATASE (APISO): 51 U/L (ref 33–130)
ALT: 8 U/L (ref 6–29)
AST: 12 U/L (ref 10–35)
BILIRUBIN TOTAL: 0.3 mg/dL (ref 0.2–1.2)
BUN / CREAT RATIO: 23 (calc) — AB (ref 6–22)
BUN: 25 mg/dL (ref 7–25)
CHLORIDE: 102 mmol/L (ref 98–110)
CO2: 32 mmol/L (ref 20–32)
Calcium: 9.6 mg/dL (ref 8.6–10.4)
Creat: 1.08 mg/dL — ABNORMAL HIGH (ref 0.60–0.93)
GFR, Est African American: 59 mL/min/{1.73_m2} — ABNORMAL LOW (ref >=60)
GFR, Est Non African American: 51 mL/min/{1.73_m2} — ABNORMAL LOW (ref >=60)
Globulin: 2.6 g/dL (calc) (ref 1.9–3.7)
Glucose, Bld: 151 mg/dL — ABNORMAL HIGH (ref 65–99)
Potassium: 3.6 mmol/L (ref 3.5–5.3)
Sodium: 143 mmol/L (ref 135–146)
Total Protein: 6.1 g/dL (ref 6.1–8.1)

## 2017-04-24 LAB — HEMOGLOBIN A1C
EAG (MMOL/L): 10.3 (calc)
HEMOGLOBIN A1C: 8.1 %{Hb} — AB (ref <5.7)
Mean Plasma Glucose: 186 (calc)

## 2017-04-24 LAB — LIPID PANEL
CHOLESTEROL: 111 mg/dL (ref <200)
HDL: 31 mg/dL — ABNORMAL LOW (ref >50)
LDL CHOLESTEROL (CALC): 55 mg/dL
Non-HDL Cholesterol (Calc): 80 mg/dL (calc) (ref <130)
Total CHOL/HDL Ratio: 3.6 (calc) (ref <5.0)
Triglycerides: 171 mg/dL — ABNORMAL HIGH (ref <150)

## 2017-04-24 LAB — MICROALBUMIN / CREATININE URINE RATIO
Creatinine, Urine: 72 mg/dL (ref 20–275)
Microalb Creat Ratio: 1432 mcg/mg creat — ABNORMAL HIGH (ref <30)
Microalb, Ur: 103.1 mg/dL

## 2017-04-27 ENCOUNTER — Ambulatory Visit (INDEPENDENT_AMBULATORY_CARE_PROVIDER_SITE_OTHER): Payer: Medicare Other | Admitting: Family Medicine

## 2017-04-27 ENCOUNTER — Encounter: Payer: Self-pay | Admitting: Family Medicine

## 2017-04-27 VITALS — BP 108/58 | HR 76 | Temp 98.0°F | Resp 20 | Ht <= 58 in | Wt 217.0 lb

## 2017-04-27 DIAGNOSIS — I2583 Coronary atherosclerosis due to lipid rich plaque: Secondary | ICD-10-CM | POA: Diagnosis not present

## 2017-04-27 DIAGNOSIS — I10 Essential (primary) hypertension: Secondary | ICD-10-CM | POA: Diagnosis not present

## 2017-04-27 DIAGNOSIS — Z794 Long term (current) use of insulin: Secondary | ICD-10-CM

## 2017-04-27 DIAGNOSIS — I2510 Atherosclerotic heart disease of native coronary artery without angina pectoris: Secondary | ICD-10-CM | POA: Diagnosis not present

## 2017-04-27 DIAGNOSIS — Z23 Encounter for immunization: Secondary | ICD-10-CM

## 2017-04-27 DIAGNOSIS — E7800 Pure hypercholesterolemia, unspecified: Secondary | ICD-10-CM | POA: Diagnosis not present

## 2017-04-27 DIAGNOSIS — E1165 Type 2 diabetes mellitus with hyperglycemia: Secondary | ICD-10-CM

## 2017-04-27 DIAGNOSIS — IMO0001 Reserved for inherently not codable concepts without codable children: Secondary | ICD-10-CM

## 2017-04-27 MED ORDER — LINAGLIPTIN 5 MG PO TABS: 5.0000 mg | ORAL_TABLET | Freq: Every day | ORAL | 4 refills | Status: DC

## 2017-04-27 NOTE — Addendum Note (Signed)
Addended by: Shary Decamp B on: 04/27/2017 01:03 PM   Modules accepted: Orders

## 2017-04-27 NOTE — Progress Notes (Signed)
Subjective:    Patient ID: [name], female    DOB: April 30, 1946, 71 y.o.   MRN: [id number]  Medication Refill    Patient is here today for follow-up of her diabetes mellitus. She is currently on Levemir 80 units twice a day. She checks her fasting blood sugar every day and her fasting blood sugars between 120 and 130. She is not checking 2 hour postprandial sugars but her hemoglobin A1c has recently risen to greater than 8. Her most recent lab work as listed below. Her LDL cholesterol is well below the goal of 70. Renal function is stable. She denies any chest pain shortness of breath or dyspnea on exertion. She denies any myalgias or right upper quadrant pain. She denies any neuropathic pain in her legs today. Diabetic foot exam is performed and shows adequate circulation with diminished sensation to 10 g monofilament and thick large dystrophic toenails rubbing and causing calluses on adjacent toes Appointment on 04/23/2017  Component Date Value Ref Range Status  . Glucose, Bld 04/23/2017 151* 65 - 99 mg/dL Final   Comment: .            Fasting reference interval . For someone without known diabetes, a glucose value >125 mg/dL indicates that they may have diabetes and this should be confirmed with a follow-up test. .   . BUN 04/23/2017 25  7 - 25 mg/dL Final  . Creat 04/23/2017 1.08* 0.60 - 0.93 mg/dL Final   Comment: For patients >34 years of age, the reference limit for Creatinine is approximately 13% higher for people identified as African-American. .   . GFR, Est Non African American 04/23/2017 51* > OR = 60 mL/min/1.70m2 Final  . GFR, Est African American 04/23/2017 59* > OR = 60 mL/min/1.80m2 Final  . BUN/Creatinine Ratio 04/23/2017 23* 6 - 22 (calc) Final  . Sodium 04/23/2017 143  135 - 146 mmol/L Final  . Potassium 04/23/2017 3.6  3.5 - 5.3 mmol/L Final  . Chloride 04/23/2017 102  98 - 110 mmol/L Final  . CO2 04/23/2017 32  20 - 32 mmol/L Final  . Calcium 04/23/2017  9.6  8.6 - 10.4 mg/dL Final  . Total Protein 04/23/2017 6.1  6.1 - 8.1 g/dL Final  . Albumin 04/23/2017 3.5* 3.6 - 5.1 g/dL Final  . Globulin 04/23/2017 2.6  1.9 - 3.7 g/dL (calc) Final  . AG Ratio 04/23/2017 1.3  1.0 - 2.5 (calc) Final  . Total Bilirubin 04/23/2017 0.3  0.2 - 1.2 mg/dL Final  . Alkaline phosphatase (APISO) 04/23/2017 51  33 - 130 U/L Final  . AST 04/23/2017 12  10 - 35 U/L Final  . ALT 04/23/2017 8  6 - 29 U/L Final  . Cholesterol 04/23/2017 111  <200 mg/dL Final  . HDL 04/23/2017 31* >50 mg/dL Final  . Triglycerides 04/23/2017 171* <150 mg/dL Final  . LDL Cholesterol (Calc) 04/23/2017 55  mg/dL (calc) Final   Comment: Reference range: <100 . Desirable range <100 mg/dL for primary prevention;   <70 mg/dL for patients with CHD or diabetic patients  with > or = 2 CHD risk factors. [name] LDL-C is now calculated using the Martin-Hopkins  calculation, which is a validated novel method providing  better accuracy than the Friedewald equation in the  estimation of LDL-C.  Cresenciano Genre et al. Annamaria Helling. 8938;101(75): 2061-2068  (http://education.[facility].com/faq/FAQ164)   . Total CHOL/HDL Ratio 04/23/2017 3.6  <5.0 (calc) Final  . Non-HDL Cholesterol (Calc) 04/23/2017 80  <130 mg/dL (calc) Final  Comment: For patients with diabetes plus 1 major ASCVD risk  factor, treating to a non-HDL-C goal of <100 mg/dL  (LDL-C of <70 mg/dL) is considered a therapeutic  option.   . WBC 04/23/2017 8.5  3.8 - 10.8 Thousand/uL Final  . RBC 04/23/2017 4.26  3.80 - 5.10 Million/uL Final  . Hemoglobin 04/23/2017 13.3  11.7 - 15.5 g/dL Final  . HCT 04/23/2017 40.1  35.0 - 45.0 % Final  . MCV 04/23/2017 94.1  80.0 - 100.0 fL Final  . MCH 04/23/2017 31.2  27.0 - 33.0 pg Final  . MCHC 04/23/2017 33.2  32.0 - 36.0 g/dL Final  . RDW 04/23/2017 13.0  11.0 - 15.0 % Final  . Platelets 04/23/2017 155  140 - 400 Thousand/uL Final  . MPV 04/23/2017 11.5  7.5 - 12.5 fL Final  . Neutro Abs 04/23/2017  5627  1,500 - 7,800 cells/uL Final  . Lymphs Abs 04/23/2017 1972  850 - 3,900 cells/uL Final  . WBC mixed population 04/23/2017 672  200 - 950 cells/uL Final  . Eosinophils Absolute 04/23/2017 170  15 - 500 cells/uL Final  . Basophils Absolute 04/23/2017 60  0 - 200 cells/uL Final  . Neutrophils Relative % 04/23/2017 66.2  % Final  . Total Lymphocyte 04/23/2017 23.2  % Final  . Monocytes Relative 04/23/2017 7.9  % Final  . Eosinophils Relative 04/23/2017 2.0  % Final  . Basophils Relative 04/23/2017 0.7  % Final  . Hgb A1c MFr Bld 04/23/2017 8.1* <5.7 % of total Hgb Final   Comment: For someone without known diabetes, a hemoglobin A1c value of 6.5% or greater indicates that they may have  diabetes and this should be confirmed with a follow-up  test. . For someone with known diabetes, a value <7% indicates  that their diabetes is well controlled and a value  greater than or equal to 7% indicates suboptimal  control. A1c targets should be individualized based on  duration of diabetes, age, comorbid conditions, and  other considerations. . Currently, no consensus exists regarding use of hemoglobin A1c for diagnosis of diabetes for children. .   . Mean Plasma Glucose 04/23/2017 186  (calc) Final  . eAG (mmol/L) 04/23/2017 10.3  (calc) Final  . Creatinine, Urine 04/23/2017 72  20 - 275 mg/dL Final  . Microalb, Ur 04/23/2017 103.1  mg/dL Final   Comment: Verified by repeat analysis. [name] Reference Range Not established   . Microalb Creat Ratio 04/23/2017 1432* <30 mcg/mg creat Final   Comment: . The ADA defines abnormalities in albumin excretion as follows: [name] Category         Result (mcg/mg creatinine) . Normal                    <30 Microalbuminuria         30-299  Clinical albuminuria   > OR = 300 . The ADA recommends that at least two of three specimens collected within a 3-6 month period be abnormal before considering a patient to be within a diagnostic category.      Past Medical History:  Diagnosis Date  . Atrial fibrillation (HCC)    paroxysmal  . CAD (coronary artery disease)    X5  . Chronic kidney disease   . Diabetes mellitus   . Gastritis   . GI bleeding   . Hiatal hernia   . Hyperglycemia   . Hyperlipidemia   . Hypertension   . Obesity   . Obstructive sleep apnea   .  Osteopenia   . Peripheral artery disease (HCC)    lower extremities  . Stroke ([place])   . Systolic murmur   . Ulcer   . Vitamin D deficiency    Past Surgical History:  Procedure Laterality Date  . ANGIOPLASTY / STENTING FEMORAL Right    leg  . Carotid doppler  10/13/2011   right ICA-49% diameter reduction and left ICA with 50%-69% diameter reduction  . CORONARY ARTERY BYPASS GRAFT  05/22/2010  . Heart bypass    . Myoview Stress  08/13/2010   inferolateral scar without ischemia   Current Outpatient Prescriptions on File Prior to Visit  Medication Sig Dispense Refill  . acetaminophen (TYLENOL) 325 MG tablet Take 650 mg by mouth every 6 (six) hours as needed.    [name] amLODipine (NORVASC) 10 MG tablet Take 10 mg by mouth daily.      [name] aspirin 81 MG tablet Take 81 mg by mouth daily.     . B-D ULTRAFINE III SHORT PEN 31G X 8 MM MISC USE AS DIRECTED 100 each 2  . clopidogrel (PLAVIX) 75 MG tablet Take 1 tablet (75 mg total) by mouth daily. NEED OV. 30 tablet 0  . clopidogrel (PLAVIX) 75 MG tablet Take 1 tablet (75 mg total) by mouth daily. <PLEASE MAKE APPOINTMENT FOR REFILLS> 15 tablet 0  . CVS VITAMIN D 2000 units CAPS TAKE ONE CAPSULE BY MOUTH DAILY 100 each 2  . furosemide (LASIX) 40 MG tablet Take 1 tablet (40 mg total) by mouth daily. NEED OV. 30 tablet 0  . hydrALAZINE (APRESOLINE) 25 MG tablet Take 1 tablet (25 mg total) by mouth as needed. Take 25 mg by mouth as needed. Takes only when BP is above 140/80 15 tablet 0  . IRON PO Take 73 mg elemental calcium/kg/hr by mouth daily.    [name] LEVEMIR FLEXTOUCH 100 UNIT/ML Pen INJECT 80 UNITS INTO THE SKIN 2 TIMES DAILY.  45 pen 3  . lisinopril (PRINIVIL,ZESTRIL) 20 MG tablet TAKE 1 TABLET (20 MG TOTAL) BY MOUTH DAILY. 90 tablet 3  . metoCLOPramide (REGLAN) 5 MG tablet TAKE 1 TABLET BY MOUTH 3 TIMES A DAY BEFORE MEALS AND 1 AT BEDTIME 360 tablet 1  . niacin (NIASPAN) 750 MG CR tablet TAKE 1 TABLET BY MOUTH EVERY DAY AT BEDTIME 90 tablet 3  . nitroGLYCERIN (NITROSTAT) 0.4 MG SL tablet Place 0.4 mg under the tongue every 5 (five) minutes as needed.    . Omega-3 Fatty Acids (FISH OIL) 1200 MG CPDR Take 2 tablets by mouth at bedtime.    . pantoprazole (PROTONIX) 40 MG tablet TAKE 1 TABLET BY MOUTH EVERY DAY 90 tablet 3  . rosuvastatin (CRESTOR) 10 MG tablet TAKE 1 TABLET BY MOUTH EVERY DAY 30 tablet 0   No current facility-administered medications on file prior to visit.    Allergies  Allergen Reactions  . Latex   . Neosporin [Neomycin-Polymyxin-Gramicidin]   . Sulfa Antibiotics Nausea And Vomiting   Social History   Social History  . Marital status: Single    Spouse name: N/A  . Number of children: 0  . Years of education: N/A   Occupational History  . Retired    Social History Main Topics  . Smoking status: Former Smoker    Quit date: 01/22/2010  . Smokeless tobacco: Never Used  . Alcohol use No  . Drug use: No  . Sexual activity: Not on file   Other Topics Concern  . Not on file   Social  History Narrative  . No narrative on file     Review of Systems  All other systems reviewed and are negative.      Objective:   Physical Exam  Constitutional: She appears well-developed and well-nourished.  Cardiovascular: Normal rate, regular rhythm and normal heart sounds.   Pulmonary/Chest: Effort normal and breath sounds normal. No respiratory distress. She has no wheezes. She has no rales.  Abdominal: Soft. Bowel sounds are normal.  Vitals reviewed.         Assessment & Plan:  Essential hypertension  Coronary artery disease due to lipid rich plaque  Pure  hypercholesterolemia  Uncontrolled type 2 diabetes mellitus without complication, with long-term current use of insulin ([place]) - Plan: Ambulatory referral to Podiatry I will schedule the patient to meet with a podiatrist to trim her toenails. I'm afraid if we don't do this this will lead to ulcers and wounds on her feet which would be at high risk for osteomyelitis and infections that could lead to amputations. Patient received her flu shot. Her blood pressure is well controlled. Her LDL cholesterol is well below the goal of 70. Her hemoglobin A1c is elevated. I will have the patient resume Tradjenta and recheck lab work in 3 months.

## 2017-04-30 DIAGNOSIS — I10 Essential (primary) hypertension: Secondary | ICD-10-CM | POA: Diagnosis not present

## 2017-04-30 DIAGNOSIS — D631 Anemia in chronic kidney disease: Secondary | ICD-10-CM | POA: Diagnosis not present

## 2017-04-30 DIAGNOSIS — M9080 Osteopathy in diseases classified elsewhere, unspecified site: Secondary | ICD-10-CM | POA: Diagnosis not present

## 2017-04-30 DIAGNOSIS — N183 Chronic kidney disease, stage 3 (moderate): Secondary | ICD-10-CM | POA: Diagnosis not present

## 2017-04-30 DIAGNOSIS — E889 Metabolic disorder, unspecified: Secondary | ICD-10-CM | POA: Diagnosis not present

## 2017-05-16 ENCOUNTER — Other Ambulatory Visit: Payer: Self-pay | Admitting: Cardiovascular Disease

## 2017-05-17 ENCOUNTER — Other Ambulatory Visit: Payer: Self-pay | Admitting: Family Medicine

## 2017-05-22 ENCOUNTER — Other Ambulatory Visit: Payer: Self-pay | Admitting: Family Medicine

## 2017-05-29 ENCOUNTER — Ambulatory Visit (INDEPENDENT_AMBULATORY_CARE_PROVIDER_SITE_OTHER): Payer: Medicare Other | Admitting: Podiatry

## 2017-05-29 ENCOUNTER — Encounter: Payer: Self-pay | Admitting: Podiatry

## 2017-05-29 VITALS — BP 171/63 | HR 80

## 2017-05-29 DIAGNOSIS — I2583 Coronary atherosclerosis due to lipid rich plaque: Secondary | ICD-10-CM | POA: Diagnosis not present

## 2017-05-29 DIAGNOSIS — M79674 Pain in right toe(s): Secondary | ICD-10-CM | POA: Diagnosis not present

## 2017-05-29 DIAGNOSIS — M79675 Pain in left toe(s): Secondary | ICD-10-CM | POA: Diagnosis not present

## 2017-05-29 DIAGNOSIS — D689 Coagulation defect, unspecified: Secondary | ICD-10-CM | POA: Diagnosis not present

## 2017-05-29 DIAGNOSIS — B351 Tinea unguium: Secondary | ICD-10-CM

## 2017-05-29 DIAGNOSIS — I2510 Atherosclerotic heart disease of native coronary artery without angina pectoris: Secondary | ICD-10-CM | POA: Diagnosis not present

## 2017-05-29 DIAGNOSIS — E1159 Type 2 diabetes mellitus with other circulatory complications: Secondary | ICD-10-CM

## 2017-05-29 NOTE — Progress Notes (Signed)
   Subjective:    Patient ID: [name], female    DOB: [birth year], 71 y.o.   MRN: [id number]  HPI this patient presents the office with chief complaint of long thick painful nails.  Patient states the nails are painful walking and wearing her shoes.  She presents to the office with her friend [name].  She says she had a bad experience the last time I doctor worked on her nails.  For this reason. She has not returned to the office for continued nail care.  She presents the office today for preventative foot care services.  Patient is taking plavix.    Review of Systems  All other systems reviewed and are negative.      Objective:   Physical Exam General Appearance  Alert, conversant and in no acute stress.  Vascular  Dorsalis pedis and posterior pulses are weakly  palpable  bilaterally.  Capillary return is within normal limits  Bilaterally. Temperature is within normal limits  Bilaterally  Neurologic  Senn-Weinstein monofilament wire test within normal limits  bilaterally. Muscle power  Within normal limits bilaterally.  Nails Thick disfigured discolored nails with subungual debride bilaterally from hallux to fifth toes bilaterally. No evidence of bacterial infection or drainage bilaterally.  Orthopedic  No limitations of motion of motion feet bilaterally.  No crepitus or effusions noted.  No bony pathology or digital deformities noted.  Skin  normotropic skin with no porokeratosis noted bilaterally.  No signs of infections or ulcers noted.          Assessment & Plan:  Onychomycosis  B/L  Diabetes with vascular disease.  IE  Debridement of nails  X 10.   Gardiner Barefoot DPM

## 2017-05-31 ENCOUNTER — Other Ambulatory Visit: Payer: Self-pay | Admitting: Cardiovascular Disease

## 2017-06-01 NOTE — Telephone Encounter (Signed)
REFILL 

## 2017-06-15 ENCOUNTER — Other Ambulatory Visit: Payer: Self-pay | Admitting: Cardiovascular Disease

## 2017-06-15 NOTE — Telephone Encounter (Signed)
Rx has been sent to the pharmacy electronically. ° °

## 2017-06-16 ENCOUNTER — Other Ambulatory Visit: Payer: Self-pay | Admitting: Family Medicine

## 2017-06-19 ENCOUNTER — Other Ambulatory Visit: Payer: Self-pay | Admitting: Family Medicine

## 2017-06-19 MED ORDER — METOCLOPRAMIDE HCL 5 MG PO TABS: ORAL_TABLET | ORAL | 1 refills | Status: AC

## 2017-07-09 ENCOUNTER — Other Ambulatory Visit: Payer: Self-pay

## 2017-07-13 ENCOUNTER — Other Ambulatory Visit: Payer: Self-pay | Admitting: Cardiovascular Disease

## 2017-08-01 ENCOUNTER — Other Ambulatory Visit: Payer: Self-pay | Admitting: Cardiovascular Disease

## 2017-08-05 ENCOUNTER — Ambulatory Visit (INDEPENDENT_AMBULATORY_CARE_PROVIDER_SITE_OTHER): Payer: Medicare Other | Admitting: Cardiovascular Disease

## 2017-08-05 ENCOUNTER — Encounter: Payer: Self-pay | Admitting: Cardiovascular Disease

## 2017-08-05 VITALS — BP 118/46 | HR 90 | Ht 58.5 in | Wt 213.0 lb

## 2017-08-05 DIAGNOSIS — I2510 Atherosclerotic heart disease of native coronary artery without angina pectoris: Secondary | ICD-10-CM | POA: Diagnosis not present

## 2017-08-05 DIAGNOSIS — I10 Essential (primary) hypertension: Secondary | ICD-10-CM

## 2017-08-05 DIAGNOSIS — I350 Nonrheumatic aortic (valve) stenosis: Secondary | ICD-10-CM

## 2017-08-05 DIAGNOSIS — I2583 Coronary atherosclerosis due to lipid rich plaque: Secondary | ICD-10-CM

## 2017-08-05 DIAGNOSIS — E7800 Pure hypercholesterolemia, unspecified: Secondary | ICD-10-CM | POA: Diagnosis not present

## 2017-08-05 DIAGNOSIS — I739 Peripheral vascular disease, unspecified: Secondary | ICD-10-CM

## 2017-08-05 DIAGNOSIS — I6523 Occlusion and stenosis of bilateral carotid arteries: Secondary | ICD-10-CM

## 2017-08-05 MED ORDER — FUROSEMIDE 40 MG PO TABS: 40.0000 mg | ORAL_TABLET | Freq: Every day | ORAL | 3 refills | Status: DC

## 2017-08-05 MED ORDER — CLOPIDOGREL BISULFATE 75 MG PO TABS: 75.0000 mg | ORAL_TABLET | Freq: Every day | ORAL | 3 refills | Status: DC

## 2017-08-05 MED ORDER — HYDRALAZINE HCL 25 MG PO TABS: 25.0000 mg | ORAL_TABLET | Freq: Every day | ORAL | 3 refills | Status: DC | PRN

## 2017-08-05 NOTE — Assessment & Plan Note (Addendum)
History of mild aortic stenosis with a peak gradient of 20 millimeters of mercury in 2013. We will recheck a 2-D echo cardiogram.

## 2017-08-05 NOTE — Assessment & Plan Note (Signed)
History of peripheral arterial disease with carotid Dopplers performed 12/10/15 revealing mild right and moderate left ICA stenosis. We will repeat her carotid Doppler studies.

## 2017-08-05 NOTE — Patient Instructions (Addendum)
Medication Instructions: Your physician recommends that you continue on your current medications as directed. Please refer to the Current Medication list given to you today.  Testing/Procedures: Your physician has requested that you have a carotid duplex. This test is an ultrasound of the carotid arteries in your neck. It looks at blood flow through these arteries that supply the brain with blood. Allow one hour for this exam. There are no restrictions or special instructions.  Your physician has requested that you have an echocardiogram. Echocardiography is a painless test that uses sound waves to create images of your heart. It provides your doctor with information about the size and shape of your heart and how well your heart's chambers and valves are working. This procedure takes approximately one hour. There are no restrictions for this procedure.  Follow-Up: Your physician wants you to follow-up in: 1 year with Dr. Gwenlyn Found. You will receive a reminder letter in the mail two months in advance. If you don't receive a letter, please call our office to schedule the follow-up appointment.  If you need a refill on your cardiac medications before your next appointment, please call your pharmacy.

## 2017-08-05 NOTE — Assessment & Plan Note (Addendum)
History of hyperlipidemia on statin therapy with recent lipid profile performed 04/23/17 revealing total cholesterol 111, triglyceride level of 171, and HDL of 31. Her LDLs have been below 70 in the past.

## 2017-08-05 NOTE — Assessment & Plan Note (Signed)
History of essential hypertension blood pressure measures 118/46. She is on amlodipine, hydralazine and lisinopril. Continue current meds at current dosing.

## 2017-08-05 NOTE — Assessment & Plan Note (Signed)
History of CAD status post coronary artery bypass grafting 510/19/11 by Dr. Ceasar Mons the LIMA to the LAD, sequential vein to the ramus branch of the circumflex and circumflex as well as PDA and PLA branches of the right coronary artery. Postop course was called. By PAF which has not been a recurrent problem. Her last Myoview performed 08/13/10 showed inferolateral scar without ischemia. She denies chest pain or shortness of breath.

## 2017-08-05 NOTE — Progress Notes (Signed)
08/05/2017 [name]   04-Nov-1945  [id number]  Primary Physician Dennard Schaumann Cammie Mcgee, MD Primary Cardiologist: Lorretta Harp MD FACP, [place], [place], [place]  HPI:  [name] is a 72 y.o.  moderately overweight single Caucasian female with no children whom I last saw in the office 11/30/15. She saw Dr. Claiborne Billings recently for followup of sleep apnea and CPAP. She has a history of CAD, status post bypass grafting x5 on May 22, 2010, by Dr. Ceasar Mons with a LIMA to her LAD, sequential vein to a ramus branch, circumflex, and the PDA and posterolateral branch as well as to the right coronary artery. Her postoperative course was complicated by paroxysmal AFib. A loop recorder implanted by Dr. Elisabeth Cara continues to be interrogated without episodes. She does have PVOD with occluded SFAs bilaterally and 50% left renal artery stenosis as well as moderate left common femoral disease. She has chronic renal insufficiency followed by Dr. Posey Pronto as well as hypertension, hyperlipidemia, and remote tobacco abuse, having quit at the time of her bypass surgery. She also has mild right and moderate left ICA stenosis and neurologically asymptomatic. She has had 2 episodes of chest pain the past 2 mornings as well as dyspnea. She had a Myoview performed August 13, 2010, after her bypass surgery that showed inferolateral scar toward the base without ischemia.  Since I saw her a year and a half ago she's remained stable. She did see Dr. Sallyanne Kuster earlier this year for her loop recorder which has since ceased to work. This was placed because of atrial tachycardia. She denies chest pain or shortness of breath.   Current Meds  Medication Sig  . acetaminophen (TYLENOL) 325 MG tablet Take 650 mg by mouth every 6 (six) hours as needed.  [name] amLODipine (NORVASC) 10 MG tablet Take 10 mg by mouth daily.    [name] aspirin 81 MG tablet Take 81 mg by mouth daily.   . B-D ULTRAFINE III SHORT PEN 31G X 8 MM MISC USE AS DIRECTED  .  Calcium Carbonate-Vit D-Min (CALCIUM 1200 PO) Take by mouth.  . clopidogrel (PLAVIX) 75 MG tablet Take 1 tablet (75 mg total) by mouth daily.  . CVS VITAMIN D 2000 units CAPS TAKE ONE CAPSULE BY MOUTH DAILY  . furosemide (LASIX) 40 MG tablet Take 1 tablet (40 mg total) by mouth daily.  . hydrALAZINE (APRESOLINE) 25 MG tablet Take 1 tablet (25 mg total) by mouth daily as needed.  [name] LEVEMIR FLEXTOUCH 100 UNIT/ML Pen INJECT 80 UNITS INTO THE SKIN 2 TIMES DAILY.  [name] linagliptin (TRADJENTA) 5 MG TABS tablet Take 1 tablet (5 mg total) by mouth daily.  [name] lisinopril (PRINIVIL,ZESTRIL) 20 MG tablet TAKE 1 TABLET (20 MG TOTAL) BY MOUTH DAILY.  [name] metoCLOPramide (REGLAN) 5 MG tablet TAKE 1 TABLET BY MOUTH 3 TIMES A DAY BEFORE MEALS AND 1 AT BEDTIME  . niacin (NIASPAN) 750 MG CR tablet TAKE 1 TABLET BY MOUTH EVERY DAY AT BEDTIME  . nitroGLYCERIN (NITROSTAT) 0.4 MG SL tablet Place 0.4 mg under the tongue every 5 (five) minutes as needed.  . Omega-3 Fatty Acids (FISH OIL) 1200 MG CPDR Take 2 tablets by mouth at bedtime.  . pantoprazole (PROTONIX) 40 MG tablet TAKE 1 TABLET BY MOUTH EVERY DAY  . rosuvastatin (CRESTOR) 10 MG tablet TAKE 1 TABLET BY MOUTH EVERY DAY  . [DISCONTINUED] clopidogrel (PLAVIX) 75 MG tablet TAKE 1 TABLET BY MOUTH DAILY**NEED OFFICE VISIT FOR MORE REFILLS  . [DISCONTINUED]  furosemide (LASIX) 40 MG tablet TAKE 1 TABLET BY MOUTH DAILY**NEEDS OFFICE VISIT FOR REFILLS.  . [DISCONTINUED] hydrALAZINE (APRESOLINE) 25 MG tablet Take 1 tablet (25 mg total) by mouth daily as needed. NEED OV.     Allergies  Allergen Reactions  . Latex   . Neosporin [Neomycin-Polymyxin-Gramicidin]   . Sulfa Antibiotics Nausea And Vomiting    Social History   Socioeconomic History  . Marital status: Single    Spouse name: Not on file  . Number of children: 0  . Years of education: Not on file  . Highest education level: Not on file  Social Needs  . Financial resource strain: Not on file  . Food  insecurity - worry: Not on file  . Food insecurity - inability: Not on file  . Transportation needs - medical: Not on file  . Transportation needs - non-medical: Not on file  Occupational History  . Occupation: Retired  Tobacco Use  . Smoking status: Former Smoker    Last attempt to quit: 01/22/2010    Years since quitting: 7.5  . Smokeless tobacco: Never Used  Substance and Sexual Activity  . Alcohol use: No  . Drug use: No  . Sexual activity: Not on file  Other Topics Concern  . Not on file  Social History Narrative  . Not on file     Review of Systems: General: negative for chills, fever, night sweats or weight changes.  Cardiovascular: negative for chest pain, dyspnea on exertion, edema, orthopnea, palpitations, paroxysmal nocturnal dyspnea or shortness of breath Dermatological: negative for rash Respiratory: negative for cough or wheezing Urologic: negative for hematuria Abdominal: negative for nausea, vomiting, diarrhea, bright red blood per rectum, melena, or hematemesis Neurologic: negative for visual changes, syncope, or dizziness All other systems reviewed and are otherwise negative except as noted above.    Blood pressure (!) 118/46, pulse 90, height 4' 10.5" (1.486 m), weight 213 lb (96.6 kg).  General appearance: alert and no distress Neck: no adenopathy, no carotid bruit, no JVD, supple, symmetrical, trachea midline and thyroid not enlarged, symmetric, no tenderness/mass/nodules Lungs: clear to auscultation bilaterally Heart: regular rate and rhythm, S1, S2 normal, no murmur, click, rub or gallop Extremities: extremities normal, atraumatic, no cyanosis or edema Pulses: Diminished pedal pulses Skin: Skin color, texture, turgor normal. No rashes or lesions Neurologic: Alert and oriented X 3, normal strength and tone. Normal symmetric reflexes. Normal coordination and gait  EKG sinus rhythm at 90 with nonspecific ST and T-wave changes. I personally reviewed this  EKG.  ASSESSMENT AND PLAN:   Hyperlipidemia History of hyperlipidemia on statin therapy with recent lipid profile performed 04/23/17 revealing total cholesterol 111, triglyceride level of 171, and HDL of 31. Her LDLs have been below 70 in the past.  Hypertension History of essential hypertension blood pressure measures 118/46. She is on amlodipine, hydralazine and lisinopril. Continue current meds at current dosing.  Peripheral artery disease History of peripheral arterial disease with carotid Dopplers performed 12/10/15 revealing mild right and moderate left ICA stenosis. We will repeat her carotid Doppler studies.  CAD (coronary artery disease) CABG x5 2011 History of CAD status post coronary artery bypass grafting 510/19/11 by Dr. Ceasar Mons the LIMA to the LAD, sequential vein to the ramus branch of the circumflex and circumflex as well as PDA and PLA branches of the right coronary artery. Postop course was called. By PAF which has not been a recurrent problem. Her last Myoview performed 08/13/10 showed inferolateral scar without ischemia. She  denies chest pain or shortness of breath.  Mild aortic stenosis by prior echocardiogram History of mild aortic stenosis with a peak gradient of 20 millimeters of mercury in 2013. We will recheck a 2-D echo cardiogram.      Lorretta Harp MD [facility], [facility] 08/05/2017 10:47 AM

## 2017-08-07 ENCOUNTER — Other Ambulatory Visit: Payer: Self-pay | Admitting: Family Medicine

## 2017-08-10 ENCOUNTER — Ambulatory Visit (HOSPITAL_COMMUNITY): Payer: Medicare Other | Attending: Cardiology

## 2017-08-10 ENCOUNTER — Other Ambulatory Visit: Payer: Self-pay

## 2017-08-10 DIAGNOSIS — I2510 Atherosclerotic heart disease of native coronary artery without angina pectoris: Secondary | ICD-10-CM | POA: Insufficient documentation

## 2017-08-10 DIAGNOSIS — E669 Obesity, unspecified: Secondary | ICD-10-CM | POA: Insufficient documentation

## 2017-08-10 DIAGNOSIS — Z951 Presence of aortocoronary bypass graft: Secondary | ICD-10-CM | POA: Insufficient documentation

## 2017-08-10 DIAGNOSIS — E785 Hyperlipidemia, unspecified: Secondary | ICD-10-CM | POA: Diagnosis not present

## 2017-08-10 DIAGNOSIS — Z6841 Body Mass Index (BMI) 40.0 and over, adult: Secondary | ICD-10-CM | POA: Insufficient documentation

## 2017-08-10 DIAGNOSIS — Z87891 Personal history of nicotine dependence: Secondary | ICD-10-CM | POA: Insufficient documentation

## 2017-08-10 DIAGNOSIS — I10 Essential (primary) hypertension: Secondary | ICD-10-CM | POA: Insufficient documentation

## 2017-08-10 DIAGNOSIS — I350 Nonrheumatic aortic (valve) stenosis: Secondary | ICD-10-CM

## 2017-08-11 ENCOUNTER — Ambulatory Visit (HOSPITAL_COMMUNITY)
Admission: RE | Admit: 2017-08-11 | Discharge: 2017-08-11 | Disposition: A | Discharge status: Home / Self Care | Payer: Medicare Other | Source: Ambulatory Visit | Attending: Cardiovascular Disease | Admitting: Cardiovascular Disease

## 2017-08-11 DIAGNOSIS — I6523 Occlusion and stenosis of bilateral carotid arteries: Secondary | ICD-10-CM | POA: Diagnosis not present

## 2017-08-13 ENCOUNTER — Other Ambulatory Visit: Payer: Self-pay | Admitting: Cardiovascular Disease

## 2017-08-13 DIAGNOSIS — I6523 Occlusion and stenosis of bilateral carotid arteries: Secondary | ICD-10-CM

## 2017-08-14 ENCOUNTER — Ambulatory Visit: Payer: Medicare Other | Admitting: Podiatry

## 2017-08-14 ENCOUNTER — Encounter: Payer: Self-pay | Admitting: Family Medicine

## 2017-08-14 ENCOUNTER — Ambulatory Visit (INDEPENDENT_AMBULATORY_CARE_PROVIDER_SITE_OTHER): Payer: Medicare Other | Admitting: Family Medicine

## 2017-08-14 VITALS — BP 166/68 | HR 103 | Temp 99.7°F | Resp 22 | Ht <= 58 in | Wt 210.0 lb

## 2017-08-14 DIAGNOSIS — I6523 Occlusion and stenosis of bilateral carotid arteries: Secondary | ICD-10-CM | POA: Diagnosis not present

## 2017-08-14 DIAGNOSIS — J189 Pneumonia, unspecified organism: Secondary | ICD-10-CM

## 2017-08-14 MED ORDER — LEVOFLOXACIN 500 MG PO TABS: 500.0000 mg | ORAL_TABLET | Freq: Every day | ORAL | 0 refills | Status: DC

## 2017-08-14 MED ORDER — PROMETHAZINE HCL 25 MG PO TABS: 25.0000 mg | ORAL_TABLET | Freq: Three times a day (TID) | ORAL | 0 refills | Status: DC | PRN

## 2017-08-14 NOTE — Progress Notes (Signed)
Subjective:    Patient ID: [name], female    DOB: [birth year], 72 y.o.   MRN: [id number]  HPI  Patient has been sick for roughly 1 week symptoms include nausea and vomiting, cough productive of purulent sputum, subjective fevers, increasing shortness of breath. She is not eating hardly at all. Patient already takes 80 units of Levemir twice a day for insulin-dependent diabetes. However she has not experienced any hypoglycemia her blood pressure today is actually elevated. She has a low-grade fever here today in office. Symptoms are getting worse. Physical exam is significant for left basilar crackles posteriorly. Past Medical History:  Diagnosis Date  . Atrial fibrillation (HCC)    paroxysmal  . CAD (coronary artery disease)    X5  . Chronic kidney disease   . Diabetes mellitus   . Gastritis   . GI bleeding   . Hiatal hernia   . Hyperglycemia   . Hyperlipidemia   . Hypertension   . Obesity   . Obstructive sleep apnea   . Osteopenia   . Peripheral artery disease (HCC)    lower extremities  . Stroke ([place])   . Systolic murmur   . Ulcer   . Vitamin D deficiency    Past Surgical History:  Procedure Laterality Date  . ANGIOPLASTY / STENTING FEMORAL Right    leg  . Carotid doppler  10/13/2011   right ICA-49% diameter reduction and left ICA with 50%-69% diameter reduction  . CORONARY ARTERY BYPASS GRAFT  05/22/2010  . Heart bypass    . Myoview Stress  08/13/2010   inferolateral scar without ischemia   Current Outpatient Medications on File Prior to Visit  Medication Sig Dispense Refill  . acetaminophen (TYLENOL) 325 MG tablet Take 650 mg by mouth every 6 (six) hours as needed.    [name] amLODipine (NORVASC) 10 MG tablet Take 10 mg by mouth daily.      [name] aspirin 81 MG tablet Take 81 mg by mouth daily.     . B-D ULTRAFINE III SHORT PEN 31G X 8 MM MISC USE AS DIRECTED 100 each 2  . Calcium Carbonate-Vit D-Min (CALCIUM 1200 PO) Take by mouth.    . clopidogrel (PLAVIX) 75 MG  tablet Take 1 tablet (75 mg total) by mouth daily. 90 tablet 3  . CVS VITAMIN D 2000 units CAPS TAKE ONE CAPSULE BY MOUTH DAILY 100 each 2  . furosemide (LASIX) 40 MG tablet Take 1 tablet (40 mg total) by mouth daily. 90 tablet 3  . hydrALAZINE (APRESOLINE) 25 MG tablet Take 1 tablet (25 mg total) by mouth daily as needed. 90 tablet 3  . LEVEMIR FLEXTOUCH 100 UNIT/ML Pen INJECT 80 UNITS INTO THE SKIN 2 TIMES DAILY. 45 pen 3  . linagliptin (TRADJENTA) 5 MG TABS tablet Take 1 tablet (5 mg total) by mouth daily. 90 tablet 4  . lisinopril (PRINIVIL,ZESTRIL) 20 MG tablet TAKE 1 TABLET (20 MG TOTAL) BY MOUTH DAILY. 90 tablet 3  . metoCLOPramide (REGLAN) 5 MG tablet TAKE 1 TABLET BY MOUTH 3 TIMES A DAY BEFORE MEALS AND 1 AT BEDTIME 360 tablet 1  . niacin (NIASPAN) 750 MG CR tablet TAKE 1 TABLET BY MOUTH EVERY DAY AT BEDTIME 90 tablet 3  . nitroGLYCERIN (NITROSTAT) 0.4 MG SL tablet Place 0.4 mg under the tongue every 5 (five) minutes as needed.    . Omega-3 Fatty Acids (FISH OIL) 1200 MG CPDR Take 2 tablets by mouth at bedtime.    . pantoprazole (PROTONIX)  40 MG tablet TAKE 1 TABLET BY MOUTH EVERY DAY 90 tablet 3  . rosuvastatin (CRESTOR) 10 MG tablet TAKE 1 TABLET BY MOUTH EVERY DAY 90 tablet 1   No current facility-administered medications on file prior to visit.    Allergies  Allergen Reactions  . Latex   . Neosporin [Neomycin-Polymyxin-Gramicidin]   . Sulfa Antibiotics Nausea And Vomiting   Social History   Socioeconomic History  . Marital status: Single    Spouse name: Not on file  . Number of children: 0  . Years of education: Not on file  . Highest education level: Not on file  Social Needs  . Financial resource strain: Not on file  . Food insecurity - worry: Not on file  . Food insecurity - inability: Not on file  . Transportation needs - medical: Not on file  . Transportation needs - non-medical: Not on file  Occupational History  . Occupation: Retired  Tobacco Use  . Smoking  status: Former Smoker    Last attempt to quit: 01/22/2010    Years since quitting: 7.5  . Smokeless tobacco: Never Used  Substance and Sexual Activity  . Alcohol use: No  . Drug use: No  . Sexual activity: Not on file  Other Topics Concern  . Not on file  Social History Narrative  . Not on file     Review of Systems  All other systems reviewed and are negative.      Objective:   Physical Exam  Constitutional: She appears well-developed and well-nourished.  Cardiovascular: Normal rate, regular rhythm and normal heart sounds.  Pulmonary/Chest: No respiratory distress. She has no wheezes. She has rhonchi.     She exhibits no tenderness.  Abdominal: Normal appearance and bowel sounds are normal. There is no tenderness. There is no rebound.  Vitals reviewed.         Assessment & Plan:  Walking pneumonia  I am concerned that the patient has walking pneumonia. Begin Levaquin 500 mg by mouth daily for 7 days. I'm concerned about dehydration given her lack of oral intake. Therefore I have recommended that she push fluids. Begin Phenergan 25 mg every 8 hours as needed for nausea and vomiting. If worsening, go to the hospital immediately. Meanwhile decrease Levemir to 40 units twice daily to avoid hypoglycemia until oral intake improves. Recheck Monday or seek medical attention immediately over the weekend if worsening.

## 2017-10-27 ENCOUNTER — Other Ambulatory Visit: Payer: Self-pay | Admitting: Family Medicine

## 2017-11-19 DIAGNOSIS — E889 Metabolic disorder, unspecified: Secondary | ICD-10-CM | POA: Diagnosis not present

## 2017-11-19 DIAGNOSIS — N189 Chronic kidney disease, unspecified: Secondary | ICD-10-CM | POA: Diagnosis not present

## 2017-11-19 DIAGNOSIS — N183 Chronic kidney disease, stage 3 (moderate): Secondary | ICD-10-CM | POA: Diagnosis not present

## 2017-11-26 DIAGNOSIS — I129 Hypertensive chronic kidney disease with stage 1 through stage 4 chronic kidney disease, or unspecified chronic kidney disease: Secondary | ICD-10-CM | POA: Diagnosis not present

## 2017-11-26 DIAGNOSIS — D631 Anemia in chronic kidney disease: Secondary | ICD-10-CM | POA: Diagnosis not present

## 2017-11-26 DIAGNOSIS — E889 Metabolic disorder, unspecified: Secondary | ICD-10-CM | POA: Diagnosis not present

## 2017-11-26 DIAGNOSIS — N183 Chronic kidney disease, stage 3 (moderate): Secondary | ICD-10-CM | POA: Diagnosis not present

## 2017-11-26 DIAGNOSIS — M9080 Osteopathy in diseases classified elsewhere, unspecified site: Secondary | ICD-10-CM | POA: Diagnosis not present

## 2017-12-27 ENCOUNTER — Other Ambulatory Visit: Payer: Self-pay | Admitting: Family Medicine

## 2018-01-05 ENCOUNTER — Other Ambulatory Visit: Payer: Self-pay | Admitting: Family Medicine

## 2018-03-29 ENCOUNTER — Encounter: Payer: Self-pay | Admitting: Family Medicine

## 2018-03-29 ENCOUNTER — Ambulatory Visit (INDEPENDENT_AMBULATORY_CARE_PROVIDER_SITE_OTHER): Payer: Medicare Other | Admitting: Family Medicine

## 2018-03-29 VITALS — BP 120/60 | HR 88 | Temp 97.8°F | Resp 20 | Ht <= 58 in | Wt 196.0 lb

## 2018-03-29 DIAGNOSIS — Z794 Long term (current) use of insulin: Secondary | ICD-10-CM | POA: Diagnosis not present

## 2018-03-29 DIAGNOSIS — I6523 Occlusion and stenosis of bilateral carotid arteries: Secondary | ICD-10-CM | POA: Diagnosis not present

## 2018-03-29 DIAGNOSIS — R634 Abnormal weight loss: Secondary | ICD-10-CM | POA: Diagnosis not present

## 2018-03-29 DIAGNOSIS — R112 Nausea with vomiting, unspecified: Secondary | ICD-10-CM | POA: Diagnosis not present

## 2018-03-29 DIAGNOSIS — E118 Type 2 diabetes mellitus with unspecified complications: Secondary | ICD-10-CM | POA: Diagnosis not present

## 2018-03-29 DIAGNOSIS — K3184 Gastroparesis: Secondary | ICD-10-CM

## 2018-03-29 MED ORDER — PROMETHAZINE HCL 25 MG PO TABS: 25.0000 mg | ORAL_TABLET | Freq: Three times a day (TID) | ORAL | 0 refills | Status: DC | PRN

## 2018-03-29 NOTE — Progress Notes (Signed)
Subjective:    Patient ID: [name], female    DOB: [birth year], 72 y.o.   MRN: [id number]  Patient is here with a one-week history of nausea.  Starting yesterday she vomited one time.  She states that she is always nauseated.  She reports early satiety.  She frequently skips meals and only eats lunch and may be a small dinner.  Whenever she does eat she feels full extremely fast.  Since I last saw her in January, the patient has lost more than 14 pounds.  She is lost 11 pounds since May.  She states that she just has no desire to eat due to the constant nausea.  She denies abdominal pain.  She denies any heartburn.  She denies any melena.  She denies any hematochezia.  She denies any constipation.  She denies any diarrhea.  She denies any fevers or chills.  She is currently taking Reglan 5 mg twice a day however she is not taking it before meals.  She is also not eating very much.  With the weight loss, she feels weak and tired the majority the time. Past Medical History:  Diagnosis Date  . Atrial fibrillation (HCC)    paroxysmal  . CAD (coronary artery disease)    X5  . Chronic kidney disease   . Diabetes mellitus   . Gastritis   . GI bleeding   . Hiatal hernia   . Hyperglycemia   . Hyperlipidemia   . Hypertension   . Obesity   . Obstructive sleep apnea   . Osteopenia   . Peripheral artery disease (HCC)    lower extremities  . Stroke ([place])   . Systolic murmur   . Ulcer   . Vitamin D deficiency    Past Surgical History:  Procedure Laterality Date  . ANGIOPLASTY / STENTING FEMORAL Right    leg  . Carotid doppler  10/13/2011   right ICA-49% diameter reduction and left ICA with 50%-69% diameter reduction  . CORONARY ARTERY BYPASS GRAFT  05/22/2010  . Heart bypass    . Myoview Stress  08/13/2010   inferolateral scar without ischemia   Current Outpatient Medications on File Prior to Visit  Medication Sig Dispense Refill  . acetaminophen (TYLENOL) 325 MG tablet Take 650 mg  by mouth every 6 (six) hours as needed.    [name] amLODipine (NORVASC) 10 MG tablet Take 10 mg by mouth daily.      [name] aspirin 81 MG tablet Take 81 mg by mouth daily.     . B-D ULTRAFINE III SHORT PEN 31G X 8 MM MISC USE AS DIRECTED 100 each 2  . Calcium Carbonate-Vit D-Min (CALCIUM 1200 PO) Take by mouth.    . clopidogrel (PLAVIX) 75 MG tablet Take 1 tablet (75 mg total) by mouth daily. 90 tablet 3  . CVS VITAMIN D 2000 units CAPS TAKE ONE CAPSULE BY MOUTH DAILY 100 each 2  . furosemide (LASIX) 40 MG tablet Take 1 tablet (40 mg total) by mouth daily. 90 tablet 3  . hydrALAZINE (APRESOLINE) 25 MG tablet Take 1 tablet (25 mg total) by mouth daily as needed. 90 tablet 3  . LEVEMIR FLEXTOUCH 100 UNIT/ML Pen INJECT 80 UNITS INTO THE SKIN 2 TIMES DAILY. 45 pen 3  . linagliptin (TRADJENTA) 5 MG TABS tablet Take 1 tablet (5 mg total) by mouth daily. 90 tablet 4  . lisinopril (PRINIVIL,ZESTRIL) 20 MG tablet TAKE 1 TABLET (20 MG TOTAL) BY MOUTH DAILY. 90 tablet 3  .  metoCLOPramide (REGLAN) 5 MG tablet TAKE 1 TABLET BY MOUTH 3 TIMES A DAY BEFORE MEALS AND 1 AT BEDTIME 360 tablet 1  . niacin (NIASPAN) 750 MG CR tablet TAKE 1 TABLET BY MOUTH EVERY DAY AT BEDTIME 90 tablet 3  . nitroGLYCERIN (NITROSTAT) 0.4 MG SL tablet Place 0.4 mg under the tongue every 5 (five) minutes as needed.    . Omega-3 Fatty Acids (FISH OIL) 1200 MG CPDR Take 2 tablets by mouth at bedtime.    . pantoprazole (PROTONIX) 40 MG tablet TAKE 1 TABLET BY MOUTH EVERY DAY 90 tablet 3  . rosuvastatin (CRESTOR) 10 MG tablet TAKE 1 TABLET BY MOUTH EVERY DAY 90 tablet 1   No current facility-administered medications on file prior to visit.    Allergies  Allergen Reactions  . Latex   . Neosporin [Neomycin-Polymyxin-Gramicidin]   . Sulfa Antibiotics Nausea And Vomiting   Social History   Socioeconomic History  . Marital status: Single    Spouse name: Not on file  . Number of children: 0  . Years of education: Not on file  . Highest  education level: Not on file  Occupational History  . Occupation: Retired  Scientific laboratory technician  . Financial resource strain: Not on file  . Food insecurity:    Worry: Not on file    Inability: Not on file  . Transportation needs:    Medical: Not on file    Non-medical: Not on file  Tobacco Use  . Smoking status: Former Smoker    Last attempt to quit: 01/22/2010    Years since quitting: 8.1  . Smokeless tobacco: Never Used  Substance and Sexual Activity  . Alcohol use: No  . Drug use: No  . Sexual activity: Not on file  Lifestyle  . Physical activity:    Days per week: Not on file    Minutes per session: Not on file  . Stress: Not on file  Relationships  . Social connections:    Talks on phone: Not on file    Gets together: Not on file    Attends religious service: Not on file    Active member of club or organization: Not on file    Attends meetings of clubs or organizations: Not on file    Relationship status: Not on file  . Intimate partner violence:    Fear of current or ex partner: Not on file    Emotionally abused: Not on file    Physically abused: Not on file    Forced sexual activity: Not on file  Other Topics Concern  . Not on file  Social History Narrative  . Not on file     Review of Systems  All other systems reviewed and are negative.      Objective:   Physical Exam  Constitutional: She appears well-developed and well-nourished.  Cardiovascular: Normal rate, regular rhythm and normal heart sounds.  Pulmonary/Chest: Effort normal and breath sounds normal. No respiratory distress. She has no wheezes. She has no rales.  Abdominal: Soft. She exhibits no distension and no mass. Bowel sounds are decreased. There is no tenderness. There is no rebound and no guarding.  Vitals reviewed.         Assessment & Plan:  Nausea and vomiting, intractability of vomiting not specified, unspecified vomiting type - Plan: CBC with Differential/Platelet, COMPLETE METABOLIC  PANEL WITH GFR, Hemoglobin A1c, Ambulatory referral to Gastroenterology  Controlled type 2 diabetes mellitus with complication, with long-term current use of insulin ([place]) -  Plan: Hemoglobin A1c, Lipid panel  Weight loss - Plan: Ambulatory referral to Gastroenterology  Gastroparesis - Plan: Ambulatory referral to Gastroenterology Given her age and long-standing history of diabetes, I suspect gastroparesis.  We may need to increase Reglan to 10 mg before meals and possible even add erythromycin as a promotility agent.  Meanwhile I need to rule out other potential causes.  I will start by obtaining a CBC, CMP, and hemoglobin A1c to evaluate for any electrolyte disorders or evidence of organ failure.  If there is no evidence of liver or kidney failure, I will empirically increase her Reglan and consult GI.  I am very concerned about a 14 pound weight loss and early satiety.  Therefore I recommended a GI consultation for possible EGD as well as a work-up for gastroparesis as my diagnosis is based on history rather than any objective findings.  Consider imaging of the abdomen and pelvis such as a CT scan if EGD is inconclusive and symptoms are not improving on Reglan higher dose

## 2018-03-30 LAB — COMPLETE METABOLIC PANEL WITH GFR
AG Ratio: 1.2 (calc) (ref 1.0–2.5)
ALBUMIN MSPROF: 3.5 g/dL — AB (ref 3.6–5.1)
ALKALINE PHOSPHATASE (APISO): 60 U/L (ref 33–130)
ALT: 7 U/L (ref 6–29)
AST: 13 U/L (ref 10–35)
BUN/Creatinine Ratio: 22 (calc) (ref 6–22)
BUN: 27 mg/dL — AB (ref 7–25)
CO2: 30 mmol/L (ref 20–32)
CREATININE: 1.25 mg/dL — AB (ref 0.60–0.93)
Calcium: 9.3 mg/dL (ref 8.6–10.4)
Chloride: 100 mmol/L (ref 98–110)
GFR, Est African American: 49 mL/min/{1.73_m2} — ABNORMAL LOW (ref >=60)
GFR, Est Non African American: 43 mL/min/{1.73_m2} — ABNORMAL LOW (ref >=60)
GLUCOSE: 149 mg/dL — AB (ref 65–99)
Globulin: 3 g/dL (calc) (ref 1.9–3.7)
Potassium: 3.8 mmol/L (ref 3.5–5.3)
Sodium: 144 mmol/L (ref 135–146)
Total Bilirubin: 0.3 mg/dL (ref 0.2–1.2)
Total Protein: 6.5 g/dL (ref 6.1–8.1)

## 2018-03-30 LAB — HEMOGLOBIN A1C
Hgb A1c MFr Bld: 6.1 % of total Hgb — ABNORMAL HIGH (ref <5.7)
MEAN PLASMA GLUCOSE: 128 (calc)
eAG (mmol/L): 7.1 (calc)

## 2018-03-30 LAB — LIPID PANEL
CHOLESTEROL: 105 mg/dL (ref <200)
HDL: 33 mg/dL — AB (ref >50)
LDL Cholesterol (Calc): 50 mg/dL (calc)
Non-HDL Cholesterol (Calc): 72 mg/dL (calc) (ref <130)
Total CHOL/HDL Ratio: 3.2 (calc) (ref <5.0)
Triglycerides: 140 mg/dL (ref <150)

## 2018-03-30 LAB — CBC WITH DIFFERENTIAL/PLATELET
BASOS ABS: 94 {cells}/uL (ref 0–200)
Basophils Relative: 0.8 %
EOS ABS: 189 {cells}/uL (ref 15–500)
Eosinophils Relative: 1.6 %
HEMATOCRIT: 39.3 % (ref 35.0–45.0)
HEMOGLOBIN: 12.9 g/dL (ref 11.7–15.5)
LYMPHS ABS: 1558 {cells}/uL (ref 850–3900)
MCH: 30.6 pg (ref 27.0–33.0)
MCHC: 32.8 g/dL (ref 32.0–36.0)
MCV: 93.1 fL (ref 80.0–100.0)
MPV: 12.3 fL (ref 7.5–12.5)
Monocytes Relative: 7.7 %
NEUTROS ABS: 9051 {cells}/uL — AB (ref 1500–7800)
Neutrophils Relative %: 76.7 %
Platelets: 199 10*3/uL (ref 140–400)
RBC: 4.22 10*6/uL (ref 3.80–5.10)
RDW: 13.5 % (ref 11.0–15.0)
Total Lymphocyte: 13.2 %
WBC: 11.8 10*3/uL — ABNORMAL HIGH (ref 3.8–10.8)
WBCMIX: 909 {cells}/uL (ref 200–950)

## 2018-04-02 ENCOUNTER — Encounter: Payer: Self-pay | Admitting: Family Medicine

## 2018-04-02 ENCOUNTER — Ambulatory Visit (INDEPENDENT_AMBULATORY_CARE_PROVIDER_SITE_OTHER): Payer: Medicare Other | Admitting: Family Medicine

## 2018-04-02 VITALS — BP 124/62 | HR 90 | Temp 98.0°F | Resp 18 | Ht <= 58 in | Wt 198.0 lb

## 2018-04-02 DIAGNOSIS — I6523 Occlusion and stenosis of bilateral carotid arteries: Secondary | ICD-10-CM | POA: Diagnosis not present

## 2018-04-02 DIAGNOSIS — K3184 Gastroparesis: Secondary | ICD-10-CM | POA: Diagnosis not present

## 2018-04-02 NOTE — Progress Notes (Signed)
Subjective:    Patient ID: [name], female    DOB: [birth year], 72 y.o.   MRN: [id number] 03/29/18 Patient is here with a one-week history of nausea.  Starting yesterday she vomited one time.  She states that she is always nauseated.  She reports early satiety.  She frequently skips meals and only eats lunch and may be a small dinner.  Whenever she does eat she feels full extremely fast.  Since I last saw her in January, the patient has lost more than 14 pounds.  She is lost 11 pounds since May.  She states that she just has no desire to eat due to the constant nausea.  She denies abdominal pain.  She denies any heartburn.  She denies any melena.  She denies any hematochezia.  She denies any constipation.  She denies any diarrhea.  She denies any fevers or chills.  She is currently taking Reglan 5 mg twice a day however she is not taking it before meals.  She is also not eating very much.  With the weight loss, she feels weak and tired the majority the time.  At that time, my plan was: Given her age and long-standing history of diabetes, I suspect gastroparesis.  We may need to increase Reglan to 10 mg before meals and possible even add erythromycin as a promotility agent.  Meanwhile I need to rule out other potential causes.  I will start by obtaining a CBC, CMP, and hemoglobin A1c to evaluate for any electrolyte disorders or evidence of organ failure.  If there is no evidence of liver or kidney failure, I will empirically increase her Reglan and consult GI.  I am very concerned about a 14 pound weight loss and early satiety.  Therefore I recommended a GI consultation for possible EGD as well as a work-up for gastroparesis as my diagnosis is based on history rather than any objective findings.  Consider imaging of the abdomen and pelvis such as a CT scan if EGD is inconclusive and symptoms are not improving on Reglan higher dose  04/02/18 Patient states she feels much better now.  She is now eating  and drinking better.  She is gained 2 pounds since her last visit.  She is holding Lasix 40 mg a day and taking Reglan 10 mg before meals.  She denies any further nausea.  She denies any vomiting.  She denies any abdominal pain.  She denies any fevers or chills.  With the weight gain, her energy has improved. Past Medical History:  Diagnosis Date  . Atrial fibrillation (HCC)    paroxysmal  . CAD (coronary artery disease)    X5  . Chronic kidney disease   . Diabetes mellitus   . Gastritis   . GI bleeding   . Hiatal hernia   . Hyperglycemia   . Hyperlipidemia   . Hypertension   . Obesity   . Obstructive sleep apnea   . Osteopenia   . Peripheral artery disease (HCC)    lower extremities  . Stroke ([place])   . Systolic murmur   . Ulcer   . Vitamin D deficiency    Past Surgical History:  Procedure Laterality Date  . ANGIOPLASTY / STENTING FEMORAL Right    leg  . Carotid doppler  10/13/2011   right ICA-49% diameter reduction and left ICA with 50%-69% diameter reduction  . CORONARY ARTERY BYPASS GRAFT  05/22/2010  . Heart bypass    . Myoview Stress  08/13/2010  inferolateral scar without ischemia   Current Outpatient Medications on File Prior to Visit  Medication Sig Dispense Refill  . acetaminophen (TYLENOL) 325 MG tablet Take 650 mg by mouth every 6 (six) hours as needed.    [name] amLODipine (NORVASC) 10 MG tablet Take 10 mg by mouth daily.      [name] aspirin 81 MG tablet Take 81 mg by mouth daily.     . B-D ULTRAFINE III SHORT PEN 31G X 8 MM MISC USE AS DIRECTED 100 each 2  . Calcium Carbonate-Vit D-Min (CALCIUM 1200 PO) Take by mouth.    . clopidogrel (PLAVIX) 75 MG tablet Take 1 tablet (75 mg total) by mouth daily. 90 tablet 3  . CVS VITAMIN D 2000 units CAPS TAKE ONE CAPSULE BY MOUTH DAILY 100 each 2  . hydrALAZINE (APRESOLINE) 25 MG tablet Take 1 tablet (25 mg total) by mouth daily as needed. 90 tablet 3  . LEVEMIR FLEXTOUCH 100 UNIT/ML Pen INJECT 80 UNITS INTO THE SKIN 2 TIMES  DAILY. 45 pen 3  . linagliptin (TRADJENTA) 5 MG TABS tablet Take 1 tablet (5 mg total) by mouth daily. 90 tablet 4  . lisinopril (PRINIVIL,ZESTRIL) 20 MG tablet TAKE 1 TABLET (20 MG TOTAL) BY MOUTH DAILY. 90 tablet 3  . metoCLOPramide (REGLAN) 5 MG tablet TAKE 1 TABLET BY MOUTH 3 TIMES A DAY BEFORE MEALS AND 1 AT BEDTIME 360 tablet 1  . niacin (NIASPAN) 750 MG CR tablet TAKE 1 TABLET BY MOUTH EVERY DAY AT BEDTIME 90 tablet 3  . nitroGLYCERIN (NITROSTAT) 0.4 MG SL tablet Place 0.4 mg under the tongue every 5 (five) minutes as needed.    . Omega-3 Fatty Acids (FISH OIL) 1200 MG CPDR Take 2 tablets by mouth at bedtime.    . pantoprazole (PROTONIX) 40 MG tablet TAKE 1 TABLET BY MOUTH EVERY DAY 90 tablet 3  . promethazine (PHENERGAN) 25 MG tablet Take 1 tablet (25 mg total) by mouth every 8 (eight) hours as needed for nausea or vomiting. 20 tablet 0  . rosuvastatin (CRESTOR) 10 MG tablet TAKE 1 TABLET BY MOUTH EVERY DAY 90 tablet 1  . furosemide (LASIX) 40 MG tablet Take 1 tablet (40 mg total) by mouth daily. (Patient not taking: Reported on 04/02/2018) 90 tablet 3   No current facility-administered medications on file prior to visit.    Allergies  Allergen Reactions  . Latex   . Neosporin [Neomycin-Polymyxin-Gramicidin]   . Sulfa Antibiotics Nausea And Vomiting   Social History   Socioeconomic History  . Marital status: Single    Spouse name: Not on file  . Number of children: 0  . Years of education: Not on file  . Highest education level: Not on file  Occupational History  . Occupation: Retired  Scientific laboratory technician  . Financial resource strain: Not on file  . Food insecurity:    Worry: Not on file    Inability: Not on file  . Transportation needs:    Medical: Not on file    Non-medical: Not on file  Tobacco Use  . Smoking status: Former Smoker    Last attempt to quit: 01/22/2010    Years since quitting: 8.1  . Smokeless tobacco: Never Used  Substance and Sexual Activity  . Alcohol  use: No  . Drug use: No  . Sexual activity: Not on file  Lifestyle  . Physical activity:    Days per week: Not on file    Minutes per session: Not on file  .  Stress: Not on file  Relationships  . Social connections:    Talks on phone: Not on file    Gets together: Not on file    Attends religious service: Not on file    Active member of club or organization: Not on file    Attends meetings of clubs or organizations: Not on file    Relationship status: Not on file  . Intimate partner violence:    Fear of current or ex partner: Not on file    Emotionally abused: Not on file    Physically abused: Not on file    Forced sexual activity: Not on file  Other Topics Concern  . Not on file  Social History Narrative  . Not on file     Review of Systems  All other systems reviewed and are negative.      Objective:   Physical Exam  Constitutional: She appears well-developed and well-nourished.  Cardiovascular: Normal rate, regular rhythm and normal heart sounds.  Pulmonary/Chest: Effort normal and breath sounds normal. No respiratory distress. She has no wheezes. She has no rales.  Abdominal: Soft. Bowel sounds are normal. She exhibits no distension and no mass. There is no tenderness. There is no rebound and no guarding.  Vitals reviewed.         Assessment & Plan:  Gastroparesis - Plan: BASIC METABOLIC PANEL WITH GFR Clinically the patient is doing much better.  I have asked her to continue to hold Lasix at the present time to avoid dehydration.  Starting next week, she can resume Lasix 40 mg a day on an as-needed basis for leg swelling or shortness of breath.  I would also like her to gradually begin weaning down on Reglan next week to determine the lowest effective dose that is necessary.  I have recommended decreasing her dose by 5 mg at a time.  For instance she would take 10 mg at breakfast, 10 mg at lunch, and 5 mg at supper.  If she tolerates that for a day or 2 she could  then decrease to 10, 5, and 5 for a few days.  If she tolerates that, she could then decrease to 5 mg before all meals.  Patient understands and is willing to try to wean down on the Reglan to determine the lowest effective dose

## 2018-04-03 LAB — BASIC METABOLIC PANEL WITH GFR
BUN / CREAT RATIO: 23 (calc) — AB (ref 6–22)
BUN: 26 mg/dL — ABNORMAL HIGH (ref 7–25)
CO2: 32 mmol/L (ref 20–32)
CREATININE: 1.14 mg/dL — AB (ref 0.60–0.93)
Calcium: 9.8 mg/dL (ref 8.6–10.4)
Chloride: 103 mmol/L (ref 98–110)
GFR, EST NON AFRICAN AMERICAN: 48 mL/min/{1.73_m2} — AB (ref >=60)
GFR, Est African American: 55 mL/min/{1.73_m2} — ABNORMAL LOW (ref >=60)
GLUCOSE: 101 mg/dL — AB (ref 65–99)
Potassium: 5 mmol/L (ref 3.5–5.3)
SODIUM: 144 mmol/L (ref 135–146)

## 2018-04-29 ENCOUNTER — Other Ambulatory Visit: Payer: Self-pay | Admitting: Family Medicine

## 2018-05-03 ENCOUNTER — Other Ambulatory Visit: Payer: Self-pay | Admitting: Family Medicine

## 2018-05-11 ENCOUNTER — Other Ambulatory Visit: Payer: Self-pay | Admitting: Family Medicine

## 2018-05-14 ENCOUNTER — Other Ambulatory Visit: Payer: Self-pay | Admitting: Family Medicine

## 2018-05-14 DIAGNOSIS — N183 Chronic kidney disease, stage 3 (moderate): Secondary | ICD-10-CM | POA: Diagnosis not present

## 2018-05-14 MED ORDER — PROMETHAZINE HCL 25 MG PO TABS: 25.0000 mg | ORAL_TABLET | Freq: Three times a day (TID) | ORAL | 0 refills | Status: DC | PRN

## 2018-05-21 DIAGNOSIS — I129 Hypertensive chronic kidney disease with stage 1 through stage 4 chronic kidney disease, or unspecified chronic kidney disease: Secondary | ICD-10-CM | POA: Diagnosis not present

## 2018-05-21 DIAGNOSIS — M9080 Osteopathy in diseases classified elsewhere, unspecified site: Secondary | ICD-10-CM | POA: Diagnosis not present

## 2018-05-21 DIAGNOSIS — E889 Metabolic disorder, unspecified: Secondary | ICD-10-CM | POA: Diagnosis not present

## 2018-05-21 DIAGNOSIS — D631 Anemia in chronic kidney disease: Secondary | ICD-10-CM | POA: Diagnosis not present

## 2018-05-21 DIAGNOSIS — N183 Chronic kidney disease, stage 3 (moderate): Secondary | ICD-10-CM | POA: Diagnosis not present

## 2018-05-21 DIAGNOSIS — Z23 Encounter for immunization: Secondary | ICD-10-CM | POA: Diagnosis not present

## 2018-06-17 ENCOUNTER — Emergency Department (HOSPITAL_COMMUNITY): Payer: Medicare Other

## 2018-06-17 ENCOUNTER — Observation Stay (HOSPITAL_COMMUNITY)
Admission: EM | Admit: 2018-06-17 | Discharge: 2018-06-19 | Disposition: A | Discharge status: Home / Self Care | Payer: Medicare Other | Attending: Internal Medicine | Admitting: Internal Medicine

## 2018-06-17 DIAGNOSIS — K219 Gastro-esophageal reflux disease without esophagitis: Secondary | ICD-10-CM

## 2018-06-17 DIAGNOSIS — Z951 Presence of aortocoronary bypass graft: Secondary | ICD-10-CM | POA: Insufficient documentation

## 2018-06-17 DIAGNOSIS — E1165 Type 2 diabetes mellitus with hyperglycemia: Secondary | ICD-10-CM | POA: Insufficient documentation

## 2018-06-17 DIAGNOSIS — J449 Chronic obstructive pulmonary disease, unspecified: Secondary | ICD-10-CM | POA: Diagnosis not present

## 2018-06-17 DIAGNOSIS — E1151 Type 2 diabetes mellitus with diabetic peripheral angiopathy without gangrene: Secondary | ICD-10-CM | POA: Diagnosis not present

## 2018-06-17 DIAGNOSIS — E785 Hyperlipidemia, unspecified: Secondary | ICD-10-CM | POA: Diagnosis not present

## 2018-06-17 DIAGNOSIS — R0602 Shortness of breath: Secondary | ICD-10-CM | POA: Diagnosis not present

## 2018-06-17 DIAGNOSIS — Z7902 Long term (current) use of antithrombotics/antiplatelets: Secondary | ICD-10-CM | POA: Insufficient documentation

## 2018-06-17 DIAGNOSIS — I739 Peripheral vascular disease, unspecified: Secondary | ICD-10-CM

## 2018-06-17 DIAGNOSIS — L89159 Pressure ulcer of sacral region, unspecified stage: Secondary | ICD-10-CM | POA: Diagnosis present

## 2018-06-17 DIAGNOSIS — Z882 Allergy status to sulfonamides status: Secondary | ICD-10-CM | POA: Insufficient documentation

## 2018-06-17 DIAGNOSIS — E11622 Type 2 diabetes mellitus with other skin ulcer: Secondary | ICD-10-CM | POA: Diagnosis not present

## 2018-06-17 DIAGNOSIS — I700 Atherosclerosis of aorta: Secondary | ICD-10-CM | POA: Insufficient documentation

## 2018-06-17 DIAGNOSIS — I129 Hypertensive chronic kidney disease with stage 1 through stage 4 chronic kidney disease, or unspecified chronic kidney disease: Secondary | ICD-10-CM | POA: Diagnosis not present

## 2018-06-17 DIAGNOSIS — E1122 Type 2 diabetes mellitus with diabetic chronic kidney disease: Secondary | ICD-10-CM | POA: Diagnosis present

## 2018-06-17 DIAGNOSIS — E46 Unspecified protein-calorie malnutrition: Secondary | ICD-10-CM | POA: Diagnosis not present

## 2018-06-17 DIAGNOSIS — E669 Obesity, unspecified: Secondary | ICD-10-CM | POA: Insufficient documentation

## 2018-06-17 DIAGNOSIS — J9601 Acute respiratory failure with hypoxia: Principal | ICD-10-CM | POA: Diagnosis present

## 2018-06-17 DIAGNOSIS — G4733 Obstructive sleep apnea (adult) (pediatric): Secondary | ICD-10-CM | POA: Diagnosis not present

## 2018-06-17 DIAGNOSIS — L89152 Pressure ulcer of sacral region, stage 2: Secondary | ICD-10-CM | POA: Diagnosis not present

## 2018-06-17 DIAGNOSIS — K3184 Gastroparesis: Secondary | ICD-10-CM | POA: Diagnosis not present

## 2018-06-17 DIAGNOSIS — Z8673 Personal history of transient ischemic attack (TIA), and cerebral infarction without residual deficits: Secondary | ICD-10-CM | POA: Insufficient documentation

## 2018-06-17 DIAGNOSIS — R0600 Dyspnea, unspecified: Secondary | ICD-10-CM | POA: Diagnosis not present

## 2018-06-17 DIAGNOSIS — E559 Vitamin D deficiency, unspecified: Secondary | ICD-10-CM | POA: Diagnosis not present

## 2018-06-17 DIAGNOSIS — N183 Chronic kidney disease, stage 3 unspecified: Secondary | ICD-10-CM

## 2018-06-17 DIAGNOSIS — E1143 Type 2 diabetes mellitus with diabetic autonomic (poly)neuropathy: Secondary | ICD-10-CM | POA: Diagnosis not present

## 2018-06-17 DIAGNOSIS — M8580 Other specified disorders of bone density and structure, unspecified site: Secondary | ICD-10-CM | POA: Insufficient documentation

## 2018-06-17 DIAGNOSIS — R112 Nausea with vomiting, unspecified: Secondary | ICD-10-CM | POA: Diagnosis not present

## 2018-06-17 DIAGNOSIS — Z6839 Body mass index (BMI) 39.0-39.9, adult: Secondary | ICD-10-CM | POA: Diagnosis not present

## 2018-06-17 DIAGNOSIS — I10 Essential (primary) hypertension: Secondary | ICD-10-CM | POA: Diagnosis present

## 2018-06-17 DIAGNOSIS — Z79899 Other long term (current) drug therapy: Secondary | ICD-10-CM | POA: Insufficient documentation

## 2018-06-17 DIAGNOSIS — R0902 Hypoxemia: Secondary | ICD-10-CM

## 2018-06-17 DIAGNOSIS — I2510 Atherosclerotic heart disease of native coronary artery without angina pectoris: Secondary | ICD-10-CM | POA: Diagnosis not present

## 2018-06-17 DIAGNOSIS — Z7901 Long term (current) use of anticoagulants: Secondary | ICD-10-CM | POA: Insufficient documentation

## 2018-06-17 DIAGNOSIS — Z794 Long term (current) use of insulin: Secondary | ICD-10-CM | POA: Insufficient documentation

## 2018-06-17 DIAGNOSIS — Z87891 Personal history of nicotine dependence: Secondary | ICD-10-CM | POA: Insufficient documentation

## 2018-06-17 DIAGNOSIS — Z7982 Long term (current) use of aspirin: Secondary | ICD-10-CM | POA: Insufficient documentation

## 2018-06-17 DIAGNOSIS — I480 Paroxysmal atrial fibrillation: Secondary | ICD-10-CM

## 2018-06-17 LAB — CBC WITH DIFFERENTIAL/PLATELET
ABS IMMATURE GRANULOCYTES: 0.05 10*3/uL (ref 0.00–0.07)
BASOS PCT: 1 %
Basophils Absolute: 0.1 10*3/uL (ref 0.0–0.1)
EOS ABS: 0.2 10*3/uL (ref 0.0–0.5)
Eosinophils Relative: 1 %
HCT: 41.1 % (ref 36.0–46.0)
Hemoglobin: 12.1 g/dL (ref 12.0–15.0)
Immature Granulocytes: 1 %
Lymphocytes Relative: 14 %
Lymphs Abs: 1.6 10*3/uL (ref 0.7–4.0)
MCH: 28.8 pg (ref 26.0–34.0)
MCHC: 29.4 g/dL — AB (ref 30.0–36.0)
MCV: 97.9 fL (ref 80.0–100.0)
MONO ABS: 0.8 10*3/uL (ref 0.1–1.0)
MONOS PCT: 7 %
NEUTROS ABS: 8.5 10*3/uL — AB (ref 1.7–7.7)
Neutrophils Relative %: 76 %
PLATELETS: 189 10*3/uL (ref 150–400)
RBC: 4.2 MIL/uL (ref 3.87–5.11)
RDW: 14.3 % (ref 11.5–15.5)
WBC: 11.1 10*3/uL — ABNORMAL HIGH (ref 4.0–10.5)
nRBC: 0 % (ref 0.0–0.2)

## 2018-06-17 LAB — I-STAT VENOUS BLOOD GAS, ED
ACID-BASE EXCESS: 8 mmol/L — AB (ref 0.0–2.0)
BICARBONATE: 35.5 mmol/L — AB (ref 20.0–28.0)
O2 SAT: 38 %
TCO2: 37 mmol/L — AB (ref 22–32)
pCO2, Ven: 61.1 mmHg — ABNORMAL HIGH (ref 44.0–60.0)
pH, Ven: 7.372 (ref 7.250–7.430)
pO2, Ven: 24 mmHg — CL (ref 32.0–45.0)

## 2018-06-17 LAB — BRAIN NATRIURETIC PEPTIDE: B NATRIURETIC PEPTIDE 5: 61.2 pg/mL (ref 0.0–100.0)

## 2018-06-17 LAB — COMPREHENSIVE METABOLIC PANEL
ALT: 7 U/L (ref 0–44)
AST: 12 U/L — ABNORMAL LOW (ref 15–41)
Albumin: 2.6 g/dL — ABNORMAL LOW (ref 3.5–5.0)
Alkaline Phosphatase: 51 U/L (ref 38–126)
Anion gap: 9 (ref 5–15)
BUN: 17 mg/dL (ref 8–23)
CHLORIDE: 105 mmol/L (ref 98–111)
CO2: 29 mmol/L (ref 22–32)
CREATININE: 1.07 mg/dL — AB (ref 0.44–1.00)
Calcium: 9.1 mg/dL (ref 8.9–10.3)
GFR, EST AFRICAN AMERICAN: 58 mL/min — AB (ref >60)
GFR, EST NON AFRICAN AMERICAN: 50 mL/min — AB (ref >60)
Glucose, Bld: 149 mg/dL — ABNORMAL HIGH (ref 70–99)
POTASSIUM: 3.8 mmol/L (ref 3.5–5.1)
Sodium: 143 mmol/L (ref 135–145)
TOTAL PROTEIN: 6.2 g/dL — AB (ref 6.5–8.1)
Total Bilirubin: 0.4 mg/dL (ref 0.3–1.2)

## 2018-06-17 LAB — I-STAT TROPONIN, ED: TROPONIN I, POC: 0 ng/mL (ref 0.00–0.08)

## 2018-06-17 MED ORDER — SODIUM CHLORIDE 0.9 % IV SOLN
500.0000 mg | Freq: Once | INTRAVENOUS | Status: AC
  Administered 2018-06-18: 500 mg via INTRAVENOUS
  Filled 2018-06-17: qty 500

## 2018-06-17 MED ORDER — SODIUM CHLORIDE 0.9 % IV SOLN
1.0000 g | Freq: Once | INTRAVENOUS | Status: AC
  Administered 2018-06-18: 1 g via INTRAVENOUS
  Filled 2018-06-17: qty 10

## 2018-06-17 MED ORDER — METHYLPREDNISOLONE SODIUM SUCC 125 MG IJ SOLR
125.0000 mg | Freq: Once | INTRAMUSCULAR | Status: AC
  Administered 2018-06-18: 125 mg via INTRAVENOUS
  Filled 2018-06-17: qty 2

## 2018-06-17 MED ORDER — IPRATROPIUM-ALBUTEROL 0.5-2.5 (3) MG/3ML IN SOLN
3.0000 mL | Freq: Once | RESPIRATORY_TRACT | Status: AC
  Administered 2018-06-17: 3 mL via RESPIRATORY_TRACT
  Filled 2018-06-17: qty 3

## 2018-06-17 NOTE — ED Notes (Signed)
RN attempted IV x2.

## 2018-06-17 NOTE — ED Provider Notes (Signed)
[facility] EMERGENCY DEPARTMENT Provider Note   CSN: [id number] Arrival date & time: 06/17/18  1904     History   Chief Complaint Chief Complaint  Patient presents with  . Shortness of Breath    HPI [name] is a 72 y.o. female.  HPI Patient is a 72 year old female with past medical history of paroxysmal atrial fibrillation, coronary artery disease, CKD, diabetes, previous GI bleed, hyperlipidemia, hypertension, OSA, previous stroke, extensive history of tobacco abuse and peripheral vascular disease presents the emergency department for evaluation of shortness of breath.  Patient reports that she has been getting more more short of breath for approximate the past month.  She states over the past 2 days she has had an acute worsening of her shortness of breath.  Developed a productive cough yesterday afternoon and since that time she has had difficulty with ambulation around her home and lying flat.  Also of note patient was on a diuretic that was stopped approximately 1 month ago that coincided with the onset of her symptoms.  She denies any fevers but has had intermittent chills.  She denies any significant worsening of the swelling in her legs.  She states that her only other associated symptoms have been intermittent nausea that is been going on for "a while," and a recent weight loss secondary to eating less than she previously had.  Her remaining review of systems is negative at this time.  Past Medical History:  Diagnosis Date  . Atrial fibrillation (HCC)    paroxysmal  . CAD (coronary artery disease)    X5  . Chronic kidney disease   . Diabetes mellitus   . Gastritis   . GI bleeding   . Hiatal hernia   . Hyperglycemia   . Hyperlipidemia   . Hypertension   . Obesity   . Obstructive sleep apnea   . Osteopenia   . Peripheral artery disease (HCC)    lower extremities  . Stroke ([place])   . Systolic murmur   . Ulcer   . Vitamin D deficiency      Patient Active Problem List   Diagnosis Date Noted  . AF (paroxysmal atrial fibrillation) ([place]) 06/18/2018  . Diabetic gastroparesis ([place]) 06/18/2018  . Protein calorie malnutrition ([place]) 06/18/2018  . GERD (gastroesophageal reflux disease) 06/18/2018  . Acute hypoxemic respiratory failure ([place]) 06/17/2018  . Bilateral carotid artery disease ([place]) 11/30/2015  . Encounter for loop recorder at end of battery life 05/22/2014  . CKD (chronic kidney disease) stage 3, GFR 30-59 ml/min (HCC) 05/22/2014  . History of stroke 05/22/2014  . 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 Mild aortic stenosis by prior echocardiogram 05/22/2014  . Atrial tachycardia, paroxysmal ([place]) 02/10/2013  . Hyperlipidemia   . Hypertension   . Sacral decubitus ulcer   . Diabetes mellitus   . Systolic murmur   . Hyperglycemia   . PAD (peripheral artery disease) ([place])   . CAD (coronary artery disease) CABG x5 2011   . Obstructive sleep apnea   . Vitamin D deficiency     Past Surgical History:  Procedure Laterality Date  . ANGIOPLASTY / STENTING FEMORAL Right    leg  . Carotid doppler  10/13/2011   right ICA-49% diameter reduction and left ICA with 50%-69% diameter reduction  . CORONARY ARTERY BYPASS GRAFT  05/22/2010  . Heart bypass    . Myoview Stress  08/13/2010   inferolateral scar without ischemia  OB History   None      Home Medications    Prior to Admission medications   Medication Sig Start Date End Date Taking? Authorizing Provider  acetaminophen (TYLENOL) 325 MG tablet Take 650 mg by mouth every 6 (six) hours as needed.   Yes [provider]  amLODipine (NORVASC) 10 MG tablet Take 10 mg by mouth daily.     Yes [provider]  aspirin 81 MG tablet Take 81 mg by mouth daily.    Yes [provider]  Calcium Carbonate-Vit D-Min (CALCIUM 1200 PO) Take 1 tablet by mouth.    Yes [provider]  clopidogrel (PLAVIX) 75  MG tablet Take 1 tablet (75 mg total) by mouth daily. 08/05/17  Yes Lorretta Harp, MD  CVS VITAMIN D 2000 units CAPS TAKE ONE CAPSULE BY MOUTH DAILY 12/19/16  Yes Susy Frizzle, MD  hydrALAZINE (APRESOLINE) 25 MG tablet Take 1 tablet (25 mg total) by mouth daily as needed. Patient taking differently: Take 25 mg by mouth daily.  08/05/17  Yes Lorretta Harp, MD  LEVEMIR FLEXTOUCH 100 UNIT/ML Pen INJECT 80 UNITS INTO THE SKIN 2 TIMES DAILY. Patient taking differently: Inject 50 Units into the skin 2 (two) times daily.  05/03/18  Yes Susy Frizzle, MD  lisinopril (PRINIVIL,ZESTRIL) 20 MG tablet TAKE 1 TABLET (20 MG TOTAL) BY MOUTH DAILY. 12/29/17  Yes Susy Frizzle, MD  metoCLOPramide (REGLAN) 5 MG tablet TAKE 1 TABLET BY MOUTH 3 TIMES A DAY BEFORE MEALS AND 1 AT BEDTIME Patient taking differently: Take 5 mg by mouth See admin instructions. TAKE 1 TABLET BY MOUTH 3 TIMES A DAY BEFORE MEALS AND 1 AT BEDTIME 06/19/17  Yes Susy Frizzle, MD  niacin (NIASPAN) 750 MG CR tablet TAKE 1 TABLET BY MOUTH EVERY DAY AT BEDTIME Patient taking differently: Take 750 mg by mouth every morning.  10/27/17  Yes Susy Frizzle, MD  nitroGLYCERIN (NITROSTAT) 0.4 MG SL tablet Place 0.4 mg under the tongue every 5 (five) minutes as needed.   Yes [provider]  Omega-3 Fatty Acids (FISH OIL) 1200 MG CPDR Take 2 tablets by mouth at bedtime.   Yes [provider]  pantoprazole (PROTONIX) 40 MG tablet TAKE 1 TABLET BY MOUTH EVERY DAY Patient taking differently: Take 40 mg by mouth daily.  08/07/17  Yes Susy Frizzle, MD  phenylephrine (SUDAFED PE) 10 MG TABS tablet Take 10 mg by mouth 4 (four) times daily.   Yes [provider]  promethazine (PHENERGAN) 25 MG tablet Take 1 tablet (25 mg total) by mouth every 8 (eight) hours as needed for nausea or vomiting. 05/14/18  Yes Susy Frizzle, MD  rosuvastatin (CRESTOR) 10 MG tablet TAKE 1 TABLET BY MOUTH EVERY DAY Patient taking  differently: Take 10 mg by mouth daily.  01/05/18  Yes Pickard, Cammie Mcgee, MD  TRADJENTA 5 MG TABS tablet TAKE 1 TABLET BY MOUTH EVERY DAY Patient taking differently: Take 5 mg by mouth daily.  04/29/18  Yes Susy Frizzle, MD  B-D ULTRAFINE III SHORT PEN 31G X 8 MM MISC USE AS DIRECTED 05/11/18   Susy Frizzle, MD  furosemide (LASIX) 40 MG tablet Take 1 tablet (40 mg total) by mouth daily. Patient not taking: Reported on 04/02/2018 08/05/17   Lorretta Harp, MD    Family History Family History  Problem Relation Age of Onset  . Cancer Mother        Did not  know what kind  . COPD Brother   . Diabetes Sister   . Heart disease Brother   . Colon cancer Neg Hx   . Colon polyps Neg Hx   . Gallbladder disease Neg Hx   . Kidney disease Neg Hx   . Esophageal cancer Neg Hx     Social History Social History   Tobacco Use  . Smoking status: Former Smoker    Last attempt to quit: 01/22/2010    Years since quitting: 8.4  . Smokeless tobacco: Never Used  Substance Use Topics  . Alcohol use: No  . Drug use: No     Allergies   Latex; Neosporin [neomycin-polymyxin-gramicidin]; and Sulfa antibiotics   Review of Systems Review of Systems  Constitutional: Positive for appetite change, fatigue and unexpected weight change. Negative for chills and fever.  HENT: Negative for ear pain and sore throat.   Eyes: Negative for pain and visual disturbance.  Respiratory: Positive for cough and shortness of breath.   Cardiovascular: Negative for chest pain and palpitations.  Gastrointestinal: Positive for nausea and vomiting. Negative for abdominal pain.  Genitourinary: Negative for dysuria and hematuria.  Musculoskeletal: Negative for arthralgias and back pain.  Skin: Negative for color change and rash.  Neurological: Negative for seizures and syncope.  All other systems reviewed and are negative.    Physical Exam Updated Vital Signs BP (!) 155/70   Pulse 90   Temp 98.6 F (37 C)  (Oral)   Resp 20   Ht 4\' 10"  (1.473 m)   Wt 86.6 kg   SpO2 96%   BMI 39.90 kg/m   Physical Exam  Constitutional:  No acute distress but does appear chronically ill.   HENT:  Head: Normocephalic and atraumatic.  Eyes: Conjunctivae are normal.  Neck: Neck supple.  Cardiovascular: Normal rate and regular rhythm.  Pulmonary/Chest: Accessory muscle usage present. Tachypnea noted. She has decreased breath sounds. She has wheezes.  Abdominal: Soft. There is no tenderness.  Musculoskeletal: She exhibits no edema.       Right lower leg: She exhibits no edema.       Left lower leg: She exhibits no edema.  Neurological: She is alert.  Skin: Skin is warm and dry.  Patient with multiple scratches on her forearm and ankles. She states these are from her cat. She also has a dressed ulcer in the gluteal area.   Psychiatric: She has a normal mood and affect.  Nursing note and vitals reviewed.    ED Treatments / Results  Labs (all labs ordered are listed, but only abnormal results are displayed) Labs Reviewed  COMPREHENSIVE METABOLIC PANEL - Abnormal; Notable for the following components:      Result Value   Glucose, Bld 149 (*)    Creatinine, Ser 1.07 (*)    Total Protein 6.2 (*)    Albumin 2.6 (*)    AST 12 (*)    GFR calc non Af Amer 50 (*)    GFR calc Af Amer 58 (*)    All other components within normal limits  CBC WITH DIFFERENTIAL/PLATELET - Abnormal; Notable for the following components:   WBC 11.1 (*)    MCHC 29.4 (*)    Neutro Abs 8.5 (*)    All other components within normal limits  CBC - Abnormal; Notable for the following components:   WBC 12.3 (*)    All other components within normal limits  BASIC METABOLIC PANEL - Abnormal; Notable for the following components:  Glucose, Bld 175 (*)    Creatinine, Ser 1.01 (*)    GFR calc non Af Amer 54 (*)    All other components within normal limits  GLUCOSE, CAPILLARY - Abnormal; Notable for the following components:    Glucose-Capillary 188 (*)    All other components within normal limits  GLUCOSE, CAPILLARY - Abnormal; Notable for the following components:   Glucose-Capillary 255 (*)    All other components within normal limits  I-STAT VENOUS BLOOD GAS, ED - Abnormal; Notable for the following components:   pCO2, Ven 61.1 (*)    pO2, Ven 24.0 (*)    Bicarbonate 35.5 (*)    TCO2 37 (*)    Acid-Base Excess 8.0 (*)    All other components within normal limits  BRAIN NATRIURETIC PEPTIDE  I-STAT TROPONIN, ED    EKG EKG Interpretation  Date/Time:  Thursday June 17 2018 19:11:06 EST Ventricular Rate:  105 PR Interval:    QRS Duration: 88 QT Interval:  337 QTC Calculation: 444 R Axis:   -86 Text Interpretation:  Sinus tachycardia most likely w low voltage p waves substantial artefact T wave abnormality Abnormal ekg Confirmed by Carmin Muskrat [phone]) on 06/17/2018 7:18:21 PM Also confirmed by Carmin Muskrat ([id number]), editor Hattie Perch [phone])  on 06/18/2018 7:41:20 AM   Radiology Dg Chest Port 1 View  Result Date: 06/17/2018 CLINICAL DATA:  Dyspnea today EXAM: PORTABLE CHEST 1 VIEW COMPARISON:  06/20/2010 FINDINGS: Multiple bilateral masslike opacities are seen scattered throughout the lungs consistent with metastasis. Heart size and mediastinal contours are stable with aortic atherosclerosis. Median sternotomy sutures are in place. Cardiac monitoring device projects over the left lateral hemithorax. No aggressive osseous lesions. Mild vascular congestion is noted. 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 Mild vascular congestion. Electronically Signed   By: Ashley Royalty M.D.   On: 06/17/2018 22:17    Procedures Procedures (including critical care time)  Medications Ordered in ED Medications  acetaminophen (TYLENOL) tablet 650 mg (has no administration in time range)  aspirin chewable tablet 81 mg (81 mg Oral Given 06/18/18 0944)  hydrALAZINE (APRESOLINE)  tablet 25 mg (25 mg Oral Given 06/18/18 0944)  niacin CR capsule 750 mg (750 mg Oral Given 06/18/18 0943)  rosuvastatin (CRESTOR) tablet 10 mg (10 mg Oral Given 06/18/18 0943)  insulin detemir (LEVEMIR) injection 50 Units (50 Units Subcutaneous Given 06/18/18 0945)  metoCLOPramide (REGLAN) tablet 5 mg (5 mg Oral Given 06/18/18 1201)  pantoprazole (PROTONIX) EC tablet 40 mg (40 mg Oral Given 06/18/18 0944)  clopidogrel (PLAVIX) tablet 75 mg (75 mg Oral Given 06/18/18 0943)  promethazine (PHENERGAN) tablet 25 mg (has no administration in time range)  enoxaparin (LOVENOX) injection 40 mg (40 mg Subcutaneous Given 06/18/18 0539)  insulin aspart (novoLOG) injection 0-9 Units (5 Units Subcutaneous Given 06/18/18 1204)  cholecalciferol (VITAMIN D3) tablet 1,000 Units (1,000 Units Oral Given 06/18/18 0943)  predniSONE (DELTASONE) tablet 40 mg (40 mg Oral Given 06/18/18 0944)  MEDLINE mouth rinse (15 mLs Mouth Rinse Not Given 06/18/18 0945)  ipratropium-albuterol (DUONEB) 0.5-2.5 (3) MG/3ML nebulizer solution 3 mL (has no administration in time range)  ipratropium-albuterol (DUONEB) 0.5-2.5 (3) MG/3ML nebulizer solution 3 mL (has no administration in time range)  ipratropium-albuterol (DUONEB) 0.5-2.5 (3) MG/3ML nebulizer solution 3 mL (3 mLs Nebulization Given 06/17/18 1959)  cefTRIAXone (ROCEPHIN) 1 g in sodium chloride 0.9 % 100 mL IVPB ( Intravenous Stopped 06/18/18 0233)  azithromycin (ZITHROMAX) 500 mg in sodium chloride 0.9 % 250 mL IVPB (0  mg Intravenous Stopped 06/18/18 0350)  methylPREDNISolone sodium succinate (SOLU-MEDROL) 125 mg/2 mL injection 125 mg (125 mg Intravenous Given 06/18/18 0159)  LORazepam (ATIVAN) injection 0.5 mg (0.5 mg Intravenous Given 06/18/18 0832)  iohexol (OMNIPAQUE) 300 MG/ML solution 75 mL (75 mLs Intravenous Contrast Given 06/18/18 0909)     Initial Impression / Assessment and Plan / ED Course  I have reviewed the triage vital signs and the nursing  notes.  Pertinent labs & imaging results that were available during my care of the patient were reviewed by me and considered in my medical decision making (see chart for details).    Patient is a 72 year old female past medical history as detailed above who presents to the emergency department for evaluation of shortness of breath.  Upon arrival to the emergency department patient was hypoxic with oxygen saturation of 85% on room air.  Patient was placed on 2 L via nasal cannula with improvement to her oxygenation.  Secondary to patient's significant past medical history as well as her presenting signs and symptoms laboratory and imaging studies were obtained.  Patient's labs do show an elevated white blood cell count and normal BNP.  Patient's chest x-ray is concerning for possible neoplasm versus metastatic disease.  Given patient's elevated white blood cell count, productive cough, and significant history of smoking patient was initially treated for a presumed COPD exacerbation with IV antibiotics and steroids.  Given the concerning findings on chest x-ray coupled with the hypoxia with which the patient presented, the hospital service was consulted for admission.  Patient will be admitted to the hospitalist service for further evaluation and care.  I believe patient's current presentation may be multifactorial.  Given her elevated white blood cell count, productive cough, smoking history, and acute hypoxia I believe a COPD exacerbation is possible.  Patient does not appear volume overloaded, however her symptoms did worsen after stopping Lasix so some small component of volume overload may be contributing to patient's hypoxia.  It is also possible that patient's chest x-ray findings concerning for neoplasm are responsible for all of patient's symptoms.  For further information regarding patient's continued hospital course please see admitting team's documentation.  The care of this patient was discussed  with my attending physician Dr. Vanita Panda, who voices agreement with work-up and ED disposition.   Final Clinical Impressions(s) / ED Diagnoses   Final diagnoses:  Hypoxia  SOB (shortness of breath)    ED Discharge Orders    None       Richele Strand, Chanda Busing, MD 06/18/18 1316    Carmin Muskrat, MD 06/19/18 3300    Carmin Muskrat, MD 06/28/18 [phone]

## 2018-06-17 NOTE — ED Triage Notes (Signed)
Pt arrived via pov from home c/o increasing sob over the past month. Pt states her doctor "took her off of her fluid pills". Pt endorses hx of smoking. Audible wheezes noted at time of triage. Pt is alert and oriented. Sp02 found to be 86% at time of triage. Pt placed on 2lpm [place], per EDP. Family member endorses recent n/v.

## 2018-06-18 ENCOUNTER — Other Ambulatory Visit: Payer: Self-pay

## 2018-06-18 ENCOUNTER — Encounter (HOSPITAL_COMMUNITY): Payer: Self-pay

## 2018-06-18 ENCOUNTER — Observation Stay (HOSPITAL_COMMUNITY): Payer: Medicare Other

## 2018-06-18 ENCOUNTER — Ambulatory Visit (HOSPITAL_BASED_OUTPATIENT_CLINIC_OR_DEPARTMENT_OTHER): Payer: Medicare Other

## 2018-06-18 DIAGNOSIS — R0602 Shortness of breath: Secondary | ICD-10-CM | POA: Diagnosis not present

## 2018-06-18 DIAGNOSIS — K3184 Gastroparesis: Secondary | ICD-10-CM

## 2018-06-18 DIAGNOSIS — I480 Paroxysmal atrial fibrillation: Secondary | ICD-10-CM

## 2018-06-18 DIAGNOSIS — J9601 Acute respiratory failure with hypoxia: Secondary | ICD-10-CM

## 2018-06-18 DIAGNOSIS — E46 Unspecified protein-calorie malnutrition: Secondary | ICD-10-CM

## 2018-06-18 DIAGNOSIS — E1143 Type 2 diabetes mellitus with diabetic autonomic (poly)neuropathy: Secondary | ICD-10-CM

## 2018-06-18 DIAGNOSIS — K219 Gastro-esophageal reflux disease without esophagitis: Secondary | ICD-10-CM

## 2018-06-18 DIAGNOSIS — C3490 Malignant neoplasm of unspecified part of unspecified bronchus or lung: Secondary | ICD-10-CM | POA: Diagnosis not present

## 2018-06-18 LAB — BASIC METABOLIC PANEL
Anion gap: 11 (ref 5–15)
BUN: 15 mg/dL (ref 8–23)
CHLORIDE: 105 mmol/L (ref 98–111)
CO2: 27 mmol/L (ref 22–32)
CREATININE: 1.01 mg/dL — AB (ref 0.44–1.00)
Calcium: 8.9 mg/dL (ref 8.9–10.3)
GFR calc non Af Amer: 54 mL/min — ABNORMAL LOW (ref >60)
Glucose, Bld: 175 mg/dL — ABNORMAL HIGH (ref 70–99)
Potassium: 3.8 mmol/L (ref 3.5–5.1)
Sodium: 143 mmol/L (ref 135–145)

## 2018-06-18 LAB — CBC
HEMATOCRIT: 40.2 % (ref 36.0–46.0)
HEMOGLOBIN: 12.1 g/dL (ref 12.0–15.0)
MCH: 28.6 pg (ref 26.0–34.0)
MCHC: 30.1 g/dL (ref 30.0–36.0)
MCV: 95 fL (ref 80.0–100.0)
Platelets: 185 10*3/uL (ref 150–400)
RBC: 4.23 MIL/uL (ref 3.87–5.11)
RDW: 14.5 % (ref 11.5–15.5)
WBC: 12.3 10*3/uL — AB (ref 4.0–10.5)
nRBC: 0 % (ref 0.0–0.2)

## 2018-06-18 LAB — GLUCOSE, CAPILLARY
GLUCOSE-CAPILLARY: 188 mg/dL — AB (ref 70–99)
GLUCOSE-CAPILLARY: 255 mg/dL — AB (ref 70–99)
Glucose-Capillary: 221 mg/dL — ABNORMAL HIGH (ref 70–99)
Glucose-Capillary: 218 mg/dL — ABNORMAL HIGH (ref 70–99)

## 2018-06-18 LAB — ECHOCARDIOGRAM COMPLETE
Height: 58 in
Weight: 3054.69 oz

## 2018-06-18 MED ORDER — INSULIN ASPART 100 UNIT/ML ~~LOC~~ SOLN
0.0000 [IU] | Freq: Three times a day (TID) | SUBCUTANEOUS | Status: DC
  Administered 2018-06-18: 3 [IU] via SUBCUTANEOUS
  Administered 2018-06-18: 5 [IU] via SUBCUTANEOUS
  Administered 2018-06-18: 2 [IU] via SUBCUTANEOUS
  Administered 2018-06-19 (×2): 1 [IU] via SUBCUTANEOUS

## 2018-06-18 MED ORDER — VITAMIN D 25 MCG (1000 UNIT) PO TABS
1000.0000 [IU] | ORAL_TABLET | Freq: Every day | ORAL | Status: DC
  Administered 2018-06-18 – 2018-06-19 (×2): 1000 [IU] via ORAL
  Filled 2018-06-18 (×2): qty 1

## 2018-06-18 MED ORDER — PREDNISONE 20 MG PO TABS
40.0000 mg | ORAL_TABLET | Freq: Every day | ORAL | Status: DC
  Administered 2018-06-18 – 2018-06-19 (×2): 40 mg via ORAL
  Filled 2018-06-18 (×2): qty 2

## 2018-06-18 MED ORDER — METOCLOPRAMIDE HCL 5 MG PO TABS
5.0000 mg | ORAL_TABLET | Freq: Three times a day (TID) | ORAL | Status: DC
  Administered 2018-06-18 – 2018-06-19 (×6): 5 mg via ORAL
  Filled 2018-06-18 (×6): qty 1

## 2018-06-18 MED ORDER — HYDRALAZINE HCL 25 MG PO TABS
25.0000 mg | ORAL_TABLET | Freq: Every day | ORAL | Status: DC
  Administered 2018-06-18 – 2018-06-19 (×2): 25 mg via ORAL
  Filled 2018-06-18 (×2): qty 1

## 2018-06-18 MED ORDER — IOHEXOL 300 MG/ML  SOLN
75.0000 mL | Freq: Once | INTRAMUSCULAR | Status: AC | PRN
  Administered 2018-06-18: 75 mL via INTRAVENOUS

## 2018-06-18 MED ORDER — CLOPIDOGREL BISULFATE 75 MG PO TABS
75.0000 mg | ORAL_TABLET | Freq: Every day | ORAL | Status: DC
  Administered 2018-06-18 – 2018-06-19 (×2): 75 mg via ORAL
  Filled 2018-06-18 (×2): qty 1

## 2018-06-18 MED ORDER — PANTOPRAZOLE SODIUM 40 MG PO TBEC
40.0000 mg | DELAYED_RELEASE_TABLET | Freq: Every day | ORAL | Status: DC
  Administered 2018-06-18 – 2018-06-19 (×2): 40 mg via ORAL
  Filled 2018-06-18 (×2): qty 1

## 2018-06-18 MED ORDER — LORAZEPAM 2 MG/ML IJ SOLN
0.5000 mg | Freq: Once | INTRAMUSCULAR | Status: AC
  Administered 2018-06-18: 0.5 mg via INTRAVENOUS
  Filled 2018-06-18: qty 1

## 2018-06-18 MED ORDER — ASPIRIN 81 MG PO CHEW
81.0000 mg | CHEWABLE_TABLET | Freq: Every day | ORAL | Status: DC
  Administered 2018-06-18 – 2018-06-19 (×2): 81 mg via ORAL
  Filled 2018-06-18 (×2): qty 1

## 2018-06-18 MED ORDER — VITAMIN D 25 MCG (1000 UNIT) PO TABS: ORAL_TABLET | Freq: Every day | ORAL | Status: DC

## 2018-06-18 MED ORDER — IPRATROPIUM-ALBUTEROL 0.5-2.5 (3) MG/3ML IN SOLN
3.0000 mL | Freq: Four times a day (QID) | RESPIRATORY_TRACT | Status: DC
  Administered 2018-06-18: 3 mL via RESPIRATORY_TRACT
  Filled 2018-06-18: qty 3

## 2018-06-18 MED ORDER — NIACIN ER 250 MG PO CPCR
750.0000 mg | ORAL_CAPSULE | ORAL | Status: DC
  Administered 2018-06-18 – 2018-06-19 (×2): 750 mg via ORAL
  Filled 2018-06-18 (×2): qty 3

## 2018-06-18 MED ORDER — IPRATROPIUM-ALBUTEROL 0.5-2.5 (3) MG/3ML IN SOLN: 3.0000 mL | RESPIRATORY_TRACT | Status: DC | PRN

## 2018-06-18 MED ORDER — ROSUVASTATIN CALCIUM 10 MG PO TABS
10.0000 mg | ORAL_TABLET | Freq: Every day | ORAL | Status: DC
  Administered 2018-06-18 – 2018-06-19 (×2): 10 mg via ORAL
  Filled 2018-06-18 (×2): qty 1

## 2018-06-18 MED ORDER — ENOXAPARIN SODIUM 40 MG/0.4ML ~~LOC~~ SOLN
40.0000 mg | SUBCUTANEOUS | Status: DC
  Administered 2018-06-18 – 2018-06-19 (×2): 40 mg via SUBCUTANEOUS
  Filled 2018-06-18 (×2): qty 0.4

## 2018-06-18 MED ORDER — ACETAMINOPHEN 325 MG PO TABS: 650.0000 mg | ORAL_TABLET | Freq: Four times a day (QID) | ORAL | Status: DC | PRN

## 2018-06-18 MED ORDER — IPRATROPIUM-ALBUTEROL 0.5-2.5 (3) MG/3ML IN SOLN: 3.0000 mL | Freq: Two times a day (BID) | RESPIRATORY_TRACT | Status: DC

## 2018-06-18 MED ORDER — PROMETHAZINE HCL 25 MG PO TABS: 25.0000 mg | ORAL_TABLET | Freq: Three times a day (TID) | ORAL | Status: DC | PRN

## 2018-06-18 MED ORDER — INSULIN DETEMIR 100 UNIT/ML ~~LOC~~ SOLN
50.0000 [IU] | Freq: Two times a day (BID) | SUBCUTANEOUS | Status: DC
  Administered 2018-06-18 – 2018-06-19 (×3): 50 [IU] via SUBCUTANEOUS
  Filled 2018-06-18 (×5): qty 0.5

## 2018-06-18 MED ORDER — GLUCERNA SHAKE PO LIQD
237.0000 mL | Freq: Three times a day (TID) | ORAL | Status: DC
  Administered 2018-06-18 – 2018-06-19 (×3): 237 mL via ORAL

## 2018-06-18 MED ORDER — ORAL CARE MOUTH RINSE
15.0000 mL | Freq: Two times a day (BID) | OROMUCOSAL | Status: DC
  Administered 2018-06-18: 15 mL via OROMUCOSAL

## 2018-06-18 NOTE — Progress Notes (Signed)
Echocardiogram 2D Echocardiogram with Definity has been performed.  06/18/2018 2:25 PM Maudry Mayhew, MHA, RVT, RDCS, RDMS

## 2018-06-18 NOTE — Progress Notes (Signed)
73 year old female with history of A. fib, coronary artery disease status post CABG and PCI, CKD stage III, diabetes mellitus type 2 who recently quit smoking came to the hospital with complains of shortness of breath and hypoxia.  Chest x-ray was suggestive of possible diffuse metastatic disease and some signs of possible fluid overload.  Patient was admitted this morning by Dr Marlowe Sax.  Patient states she feels a little better after getting bronchodilator treatment but still has some exertional shortness of breath.  Does not use any home oxygen and currently she is on 3-4 L nasal cannula.  Diffuse coarse breath sounds on the examination  We will plan on continuing bronchodilators, supplemental oxygen, wean off as necessary.  CT of the chest with contrast has been ordered.  Concern for metastatic disease which can be followed up outpatient once her breathing symptoms have improved and she is off oxygen.  Call with further questions as needed.  Gerlean Ren MD [facility]

## 2018-06-18 NOTE — H&P (Signed)
History and Physical    [name] YIF:[id number] DOB: [birth year] DOA: 06/17/2018  PCP: Susy Frizzle, MD Patient coming from: Home  Chief Complaint: Shortness of breath, cough  HPI: [name] is a 72 y.o. female with medical history significant of A. fib, coronary artery disease status post CABG and PCI, CKD, diabetes, hypertension, hyperlipidemia presenting to the hospital for evaluation of shortness of breath and cough.  Patient reports having 1 month history of dyspnea even at rest.  States she has been coughing up white-colored phlegm.  Reports having orthopnea.  Denies having any chest pain, lower extremity edema, paroxysmal nocturnal dyspnea, or wheezing.  She does not use any inhalers at home.  States she is a former smoker and quit 8 years ago.  She was previously smoking 1 pack/day and had started smoking at the age of 49.  States she takes Lasix as needed for leg swelling but has not required it recently.  Reports losing 20 pounds in the past 3 to 4 months which she attributes to nausea and decreased appetite due to gastroparesis.  Denies having any fevers or chills.  Denies having any dysuria, urinary frequency, or urgency.  ED Course: Afebrile.  Not tachycardic.  O2 saturation in the 80s on room air. I-STAT troponin negative and EKG not suggestive of ACS.  White count 11.1.  BNP normal.  Chest x-ray findings concerning for diffuse pulmonary metastasis and mild vascular congestion.  Patient received ceftriaxone, azithromycin, methylprednisolone 125 mg, and DuoNeb treatment in the ED.  Per ED provider, noted to be wheezing on exam.  Review of Systems: As per HPI otherwise 10 point review of systems negative.  Past Medical History:  Diagnosis Date  . Atrial fibrillation (HCC)    paroxysmal  . CAD (coronary artery disease)    X5  . Chronic kidney disease   . Diabetes mellitus   . Gastritis   . GI bleeding   . Hiatal hernia   . Hyperglycemia   . Hyperlipidemia   .  Hypertension   . Obesity   . Obstructive sleep apnea   . Osteopenia   . Peripheral artery disease (HCC)    lower extremities  . Stroke ([place])   . Systolic murmur   . Ulcer   . Vitamin D deficiency     Past Surgical History:  Procedure Laterality Date  . ANGIOPLASTY / STENTING FEMORAL Right    leg  . Carotid doppler  10/13/2011   right ICA-49% diameter reduction and left ICA with 50%-69% diameter reduction  . CORONARY ARTERY BYPASS GRAFT  05/22/2010  . Heart bypass    . Myoview Stress  08/13/2010   inferolateral scar without ischemia     reports that she quit smoking about 8 years ago. She has never used smokeless tobacco. She reports that she does not drink alcohol or use drugs.  Allergies  Allergen Reactions  . Latex   . Neosporin [Neomycin-Polymyxin-Gramicidin]   . Sulfa Antibiotics Nausea And Vomiting    Family History  Problem Relation Age of Onset  . Cancer Mother        Did not know what kind  . COPD Brother   . Diabetes Sister   . Heart disease Brother   . Colon cancer Neg Hx   . Colon polyps Neg Hx   . Gallbladder disease Neg Hx   . Kidney disease Neg Hx   . Esophageal cancer Neg Hx     Prior to Admission medications   Medication  Sig Start Date End Date Taking? Authorizing Provider  acetaminophen (TYLENOL) 325 MG tablet Take 650 mg by mouth every 6 (six) hours as needed.   Yes [provider]  amLODipine (NORVASC) 10 MG tablet Take 10 mg by mouth daily.     Yes [provider]  aspirin 81 MG tablet Take 81 mg by mouth daily.    Yes [provider]  Calcium Carbonate-Vit D-Min (CALCIUM 1200 PO) Take 1 tablet by mouth.    Yes [provider]  clopidogrel (PLAVIX) 75 MG tablet Take 1 tablet (75 mg total) by mouth daily. 08/05/17  Yes Lorretta Harp, MD  [employer] VITAMIN D 2000 units CAPS TAKE ONE CAPSULE BY MOUTH DAILY 12/19/16  Yes Susy Frizzle, MD  hydrALAZINE (APRESOLINE) 25 MG tablet Take 1 tablet (25 mg total) by mouth  daily as needed. Patient taking differently: Take 25 mg by mouth daily.  08/05/17  Yes Lorretta Harp, MD  LEVEMIR FLEXTOUCH 100 UNIT/ML Pen INJECT 80 UNITS INTO THE SKIN 2 TIMES DAILY. Patient taking differently: Inject 50 Units into the skin 2 (two) times daily.  05/03/18  Yes Susy Frizzle, MD  lisinopril (PRINIVIL,ZESTRIL) 20 MG tablet TAKE 1 TABLET (20 MG TOTAL) BY MOUTH DAILY. 12/29/17  Yes Susy Frizzle, MD  metoCLOPramide (REGLAN) 5 MG tablet TAKE 1 TABLET BY MOUTH 3 TIMES A DAY BEFORE MEALS AND 1 AT BEDTIME Patient taking differently: Take 5 mg by mouth See admin instructions. TAKE 1 TABLET BY MOUTH 3 TIMES A DAY BEFORE MEALS AND 1 AT BEDTIME 06/19/17  Yes Susy Frizzle, MD  niacin (NIASPAN) 750 MG CR tablet TAKE 1 TABLET BY MOUTH EVERY DAY AT BEDTIME Patient taking differently: Take 750 mg by mouth every morning.  10/27/17  Yes Susy Frizzle, MD  nitroGLYCERIN (NITROSTAT) 0.4 MG SL tablet Place 0.4 mg under the tongue every 5 (five) minutes as needed.   Yes [provider]  Omega-3 Fatty Acids (FISH OIL) 1200 MG CPDR Take 2 tablets by mouth at bedtime.   Yes [provider]  pantoprazole (PROTONIX) 40 MG tablet TAKE 1 TABLET BY MOUTH EVERY DAY Patient taking differently: Take 40 mg by mouth daily.  08/07/17  Yes Susy Frizzle, MD  phenylephrine (SUDAFED PE) 10 MG TABS tablet Take 10 mg by mouth 4 (four) times daily.   Yes [provider]  promethazine (PHENERGAN) 25 MG tablet Take 1 tablet (25 mg total) by mouth every 8 (eight) hours as needed for nausea or vomiting. 05/14/18  Yes Susy Frizzle, MD  rosuvastatin (CRESTOR) 10 MG tablet TAKE 1 TABLET BY MOUTH EVERY DAY Patient taking differently: Take 10 mg by mouth daily.  01/05/18  Yes Pickard, Cammie Mcgee, MD  TRADJENTA 5 MG TABS tablet TAKE 1 TABLET BY MOUTH EVERY DAY Patient taking differently: Take 5 mg by mouth daily.  04/29/18  Yes Susy Frizzle, MD  B-D ULTRAFINE III SHORT PEN 31G X 8  MM MISC USE AS DIRECTED 05/11/18   Susy Frizzle, MD  furosemide (LASIX) 40 MG tablet Take 1 tablet (40 mg total) by mouth daily. Patient not taking: Reported on 04/02/2018 08/05/17   Lorretta Harp, MD    Physical Exam: Vitals:   06/17/18 1912 06/17/18 1918 06/18/18 0033  BP: (!) 174/70  (!) 167/54  Pulse: 100  92  Resp: (!) 21  20  Temp:  98.8 F (37.1 C) 97.9 F (36.6 C)  TempSrc:  Oral Oral  SpO2: (!) 85%  96%  Weight:   86.6 kg  Height:   4\' 10"  (1.473 m)    Physical Exam  Constitutional: She is oriented to person, place, and time. She appears well-developed and well-nourished. No distress.  HENT:  Head: Normocephalic.  Mouth/Throat: Oropharynx is clear and moist.  Eyes: Right eye exhibits no discharge. Left eye exhibits no discharge.  Neck: Neck supple. No tracheal deviation present.  Cardiovascular: Normal rate, regular rhythm and intact distal pulses.  Pulmonary/Chest: Effort normal. She has wheezes. She has no rales.  Speaking clearly in full sentences No accessory muscle use On 2 L supplemental oxygen  Abdominal: Soft. Bowel sounds are normal. She exhibits no distension. There is no tenderness.  Musculoskeletal: She exhibits no edema.  Neurological: She is alert and oriented to person, place, and time.  Skin: Skin is warm and dry. She is not diaphoretic.  Psychiatric: She has a normal mood and affect. Her behavior is normal.     Labs on Admission: I have personally reviewed following labs and imaging studies  CBC: Recent Labs  Lab 06/17/18 2114  WBC 11.1*  NEUTROABS 8.5*  HGB 12.1  HCT 41.1  MCV 97.9  PLT 326   Basic Metabolic Panel: Recent Labs  Lab 06/17/18 2114  NA 143  K 3.8  CL 105  CO2 29  GLUCOSE 149*  BUN 17  CREATININE 1.07*  CALCIUM 9.1   GFR: Estimated Creatinine Clearance: 43.8 mL/min (A) (by C-G formula based on SCr of 1.07 mg/dL (H)). Liver Function Tests: Recent Labs  Lab 06/17/18 2114  AST 12*  ALT 7  ALKPHOS 51    BILITOT 0.4  PROT 6.2*  ALBUMIN 2.6*   No results for input(s): LIPASE, AMYLASE in the last 168 hours. No results for input(s): AMMONIA in the last 168 hours. Coagulation Profile: No results for input(s): INR, PROTIME in the last 168 hours. Cardiac Enzymes: No results for input(s): CKTOTAL, CKMB, CKMBINDEX, TROPONINI in the last 168 hours. BNP (last 3 results) No results for input(s): PROBNP in the last 8760 hours. HbA1C: No results for input(s): HGBA1C in the last 72 hours. CBG: No results for input(s): GLUCAP in the last 168 hours. Lipid Profile: No results for input(s): CHOL, HDL, LDLCALC, TRIG, CHOLHDL, LDLDIRECT in the last 72 hours. Thyroid Function Tests: No results for input(s): TSH, T4TOTAL, FREET4, T3FREE, THYROIDAB in the last 72 hours. Anemia Panel: No results for input(s): VITAMINB12, FOLATE, FERRITIN, TIBC, IRON, RETICCTPCT in the last 72 hours. Urine analysis:    Component Value Date/Time   COLORURINE YELLOW 02/22/2013 [place] 02/22/2013 1037   LABSPEC 1.019 02/22/2013 1037   PHURINE 5.5 02/22/2013 1037   GLUCOSEU NEG 02/22/2013 1037   HGBUR NEG 02/22/2013 1037   BILIRUBINUR NEG 02/22/2013 1037   KETONESUR NEG 02/22/2013 1037   PROTEINUR NEG 02/22/2013 1037   UROBILINOGEN 0.2 02/22/2013 1037   NITRITE NEG 02/22/2013 1037   LEUKOCYTESUR LARGE (A) 02/22/2013 1037    Radiological Exams on Admission: Dg Chest Port 1 View  Result Date: 06/17/2018 CLINICAL DATA:  Dyspnea today EXAM: PORTABLE CHEST 1 VIEW COMPARISON:  06/20/2010 FINDINGS: Multiple bilateral masslike opacities are seen scattered throughout the lungs consistent with metastasis. Heart size and mediastinal contours are stable with aortic atherosclerosis. Median sternotomy sutures are in place. Cardiac monitoring device projects over the left lateral hemithorax. No aggressive osseous lesions. Mild vascular congestion is noted. IMPRESSION: Findings concerning for diffuse pulmonary  metastasis. Mild vascular congestion. Electronically Signed  By: Ashley Royalty M.D.   On: 06/17/2018 22:17    EKG: Independently reviewed.  Sinus tachycardia (heart rate 105), low voltage.  Assessment/Plan Principal Problem:   Acute hypoxemic respiratory failure (HCC) Active Problems:   Hyperlipidemia   Hypertension   Sacral decubitus ulcer   PAD (peripheral artery disease) (HCC)   CAD (coronary artery disease) CABG x5 2011   CKD (chronic kidney disease) stage 3, GFR 30-59 ml/min (HCC)   Type 2 diabetes mellitus with chronic kidney disease (HCC)   AF (paroxysmal atrial fibrillation) (HCC)   Diabetic gastroparesis (HCC)   Protein calorie malnutrition (HCC)   GERD (gastroesophageal reflux disease)   Acute hypoxemic respiratory failure due to suspected pulmonary metastasis, mild pulmonary edema, and acute exacerbation of reactive airway disease -Afebrile.  Not tachycardic.  O2 saturation in the 80s on room air.  Currently satting well on 2 L supplemental oxygen. White count borderline elevated 11.1.  BNP normal.  Chest x-ray findings concerning for diffuse pulmonary metastasis and mild vascular congestion.  Patient received ceftriaxone, azithromycin, 125 mg methylprednisolone, and DuoNeb treatment in the ED. -Echo done in January 2019 showing normal systolic function (EF 60 to 65%) and grade 1 diastolic dysfunction.  Will repeat echocardiogram. -Discussed patient's case with radiology.  Chest CT with contrast recommended for evaluation of suspected pulmonary metastasis.  Study has been ordered. -Will hold off giving IV Lasix at this time as patient will be receiving contrast with her chest CT. She has chronic kidney disease but renal function currently at baseline (creatinine 1.0). -Noted to be wheezing on exam. ?COPD with history of smoking.  No lung hyperinflation seen on chest x-ray. Continue duo nebs every 6 hours.  Prednisone 40 mg daily. -Repeat CBC in a.m.  History of paroxysmal A.  Fib -Currently in sinus rhythm. CHA2DS2VAsc 5.  -Currently not on anticoagulation or rate control agent.  Unable to find details in prior cardiology documentation.  Coronary artery disease status post CABG and PCI -Stable.  I-STAT troponin negative and EKG not suggestive of ACS. -Continue aspirin, Plavix  CKD 3 -Stable.  Creatinine 1.0, at baseline. -Continue monitor renal function  Hypertension Blood pressure currently elevated (systolic in the 937J).  -IV diuresis as above -Continue home amlodipine, hydralazine prn -Hold home lisinopril at this time  Hyperlipidemia -Continue Crestor  Peripheral arterial disease -Continue aspirin, Plavix  Well-controlled type 2 diabetes A1c 6.1 on March 29, 2018.  Blood glucose 149 on admission. -Continue home Levemir 50 units twice daily -Sliding scale insulin sensitive -CBG checks  History of gastroparesis -Continue home Reglan, Phenergan PRN  Protein calorie malnutrition Albumin 2.6. -Nutrition consult  GERD -Continue PPI  Sacral decubitus ulcer Stage 2 per report from nursing staff. -Wound care consult  DVT prophylaxis: Lovenox Code Status: Patient wishes to be full code. Family Communication: No family available. Disposition Plan: Anticipate discharge to home in 1 to 2 days. Consults called: None Admission status: Observation  Shela Leff MD [facility] Pager [phone]  If 7PM-7AM, please contact night-coverage [email or web address] Password [facility]  06/18/2018, 3:43 AM

## 2018-06-18 NOTE — Progress Notes (Addendum)
Initial Nutrition Assessment   DOCUMENTATION CODES:   Obesity unspecified  INTERVENTION:    Glucerna Shake po BID, each supplement provides 220 kcal and 10 grams of protein  NUTRITION DIAGNOSIS:   Increased nutrient needs related to wound healing as evidenced by estimated needs  GOAL:   Patient will meet greater than or equal to 90% of their needs  MONITOR:   PO intake, Supplement acceptance, Labs, Skin, Weight trends, I & O's  REASON FOR ASSESSMENT:   Consult Assessment of nutrition requirement/status  ASSESSMENT:   72 yo Female with history of A. fib, coronary artery disease status post CABG and PCI, CKD stage III and DM presented with complaints of shortness of breath and hypoxia.    RD spoke with patient at bedside. She reports her appetite is okay. Typically consumes 2 meals per day at home. On occasion she will drink Glucerna Shake nutrition supplements.  CWOCN note reviewed. Pt with Stage III sacral pressure injury. Medications include reglan, vitamin D and niacin. Labs reviewed. CBG's 188-255.  Pt reports intentional weight loss since January 2019. Shares she didn't want to be "like those people on My 600 lb Life".  Albumin is not an indicator of nutritional status, but rather an indicator of morbidity and mortality, and recovery from acute and chronic illness.  NUTRITION - FOCUSED PHYSICAL EXAM:  Completed. No muscle or fat depletions noticed.  Diet Order:   Diet Order            Diet heart healthy/carb modified Room service appropriate? Yes; Fluid consistency: Thin  Diet effective now             EDUCATION NEEDS:   No education needs have been identified at this time  Skin:  Skin Assessment: Skin Integrity Issues: Skin Integrity Issues:: Stage III Stage III: sacrum  Last BM:  11/13  Height:   Ht Readings from Last 1 Encounters:  06/18/18 4\' 10"  (1.473 m)   Weight:   Wt Readings from Last 1 Encounters:  06/18/18 86.6 kg   Wt  Readings from Last 10 Encounters:  06/18/18 86.6 kg  04/02/18 89.8 kg  03/29/18 88.9 kg  08/14/17 95.3 kg  08/05/17 96.6 kg  04/27/17 98.4 kg  03/18/16 102.1 kg  11/30/15 104.8 kg  10/02/15 102.5 kg  09/18/15 101.6 kg     BMI:  Body mass index is 39.9 kg/m.  Estimated Nutritional Needs:   Kcal:  1700-1900  Protein:  100-115 gm  Fluid:  1.7-1.9 L  Arthur Holms, RD, LDN Pager #: [phone] After-Hours Pager #: [phone]

## 2018-06-18 NOTE — Consult Note (Signed)
[place] Nurse wound consult note Reason for Consult: sacral wound Patient self reports it heals and then opens again. 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 She has a cushion for her recliner per the patient. Wound type: Stage 3 Pressure injury Pressure Injury POA: Yes Measurement: 0.2cm x 0.1cm x 0.2cm  Wound bed: full thickness ulceration at the apex of the gluteal cleft, chronic in appearance  Drainage (amount, consistency, odor) scant, serosanguinous  Periwound: red, but blanchable  Dressing procedure/placement/frequency:  Continue silicone foam, change every 3 days and PRN soilage.  Turn and reposition, patient can turn herself Pressure redistribution chair pad when up in chair at home.  Discussed POC with patient and bedside nurse.  Re consult if needed, will not follow at this time. Thanks  Georgina Krist [employer], RN,CWOCN, CNS, [place] [phone])

## 2018-06-19 DIAGNOSIS — I2510 Atherosclerotic heart disease of native coronary artery without angina pectoris: Secondary | ICD-10-CM

## 2018-06-19 DIAGNOSIS — I2583 Coronary atherosclerosis due to lipid rich plaque: Secondary | ICD-10-CM

## 2018-06-19 DIAGNOSIS — J9601 Acute respiratory failure with hypoxia: Secondary | ICD-10-CM | POA: Diagnosis not present

## 2018-06-19 DIAGNOSIS — I739 Peripheral vascular disease, unspecified: Secondary | ICD-10-CM

## 2018-06-19 DIAGNOSIS — I480 Paroxysmal atrial fibrillation: Secondary | ICD-10-CM | POA: Diagnosis not present

## 2018-06-19 DIAGNOSIS — C7800 Secondary malignant neoplasm of unspecified lung: Secondary | ICD-10-CM | POA: Diagnosis not present

## 2018-06-19 DIAGNOSIS — N183 Chronic kidney disease, stage 3 (moderate): Secondary | ICD-10-CM | POA: Diagnosis not present

## 2018-06-19 DIAGNOSIS — L89152 Pressure ulcer of sacral region, stage 2: Secondary | ICD-10-CM

## 2018-06-19 DIAGNOSIS — E7800 Pure hypercholesterolemia, unspecified: Secondary | ICD-10-CM | POA: Diagnosis not present

## 2018-06-19 LAB — GLUCOSE, CAPILLARY
Glucose-Capillary: 144 mg/dL — ABNORMAL HIGH (ref 70–99)
Glucose-Capillary: 132 mg/dL — ABNORMAL HIGH (ref 70–99)

## 2018-06-19 MED ORDER — PREDNISONE 20 MG PO TABS: 40.0000 mg | ORAL_TABLET | Freq: Every day | ORAL | 0 refills | Status: AC

## 2018-06-19 MED ORDER — AZITHROMYCIN 250 MG PO TABS: ORAL_TABLET | ORAL | 0 refills | Status: AC

## 2018-06-19 MED ORDER — ALBUTEROL SULFATE HFA 108 (90 BASE) MCG/ACT IN AERS: 2.0000 | INHALATION_SPRAY | Freq: Four times a day (QID) | RESPIRATORY_TRACT | 2 refills | Status: AC | PRN

## 2018-06-19 NOTE — Care Management Note (Addendum)
Case Management Note  Patient Details  Name: [name] MRN: [id number] Date of Birth: 10-May-1946  Subjective/Objective:   Patient for dc home today, NCM spoke with patient about home oxygen, she would like to work with [facility] for home oxygen.  Awaiting chronic dx for home oxygen from MD .                   Action/Plan: NCM will follow for transition of care needs.   Expected Discharge Date:  06/19/18               Expected Discharge Plan:  Home/Self Care  In-House Referral:     Discharge planning Services  CM Consult  Post Acute Care Choice:  Durable Medical Equipment Choice offered to:  Patient  DME Arranged:  Oxygen DME Agency:  [place]:    [facility] Agency:     Status of Service:  Completed, signed off  If discussed at [place] of Stay Meetings, dates discussed:    Additional Comments:  Zenon Mayo, RN 06/19/2018, 10:38 AM

## 2018-06-19 NOTE — Discharge Summary (Signed)
Physician Discharge Summary  [name] WCB:[id number] DOB: 12/14/1945 DOA: 06/17/2018  PCP: Susy Frizzle, MD  Admit date: 06/17/2018 Discharge date: 06/19/2018  Admitted From: Home Disposition: Home  Recommendations for Outpatient Follow-up:  1. Follow up with PCP in 1-2 weeks 2. Please obtain BMP/CBC in one week your next doctors visit.  3. Prednisone orally for 4 days.  Z-Pak prescribed.  Pro-air prescribed 4. Advised to follow-up outpatient with primary care provider and oncology.  Cancer center information provided.  This needs to happen hopefully within the next week or 2.  Patient is aware of this. 5. 2 L home oxygen arrangements made  Home Health: None Equipment/Devices: 2 L nasal cannula home oxygen Discharge Condition: Stable CODE STATUS: Full code Diet recommendation: Cardiac  Brief/Interim Summary: 72 year old with a history of A. fib, coronary artery disease status post CABG and PCI, CKD stage II-3, diabetes mellitus type 2, essential hypertension, hyperlipidemia came to the hospital with complains of cough and shortness of breath.  There were initial signs of possible slight fluid overload therefore Lasix was given.  Chest x-ray was suggestive of diffuse metastatic disease therefore CT of the chest was done which confirmed concerns for diffuse metastatic disease.  She was initially hypoxic requiring 4 L nasal cannula.  She aggressively received bronchodilator treatment along with steroids which she tolerated well. After extensive discussion with the patient we came to conclusion that patient will follow-up outpatient with a primary care provider within the next week for further work-up for her possible pulmonary malignancy.  I have also provided her with cancer center information to make follow-up appointments as soon as she can.  Her long-term power of attorney/next of kin is also at bedside who is aware of this.  Patient is still requiring oxygen with ambulation as  she desaturates to 87% on room air.  2 L nasal cannula oxygen arrangements have been made for her at home.  Otherwise medically stable to be discharged   Discharge Diagnoses:  Principal Problem:   Acute hypoxemic respiratory failure ([place]) Active Problems:   Hyperlipidemia   Hypertension   Sacral decubitus ulcer   PAD (peripheral artery disease) (HCC)   CAD (coronary artery disease) CABG x5 2011   CKD (chronic kidney disease) stage 3, GFR 30-59 ml/min (HCC)   Type 2 diabetes mellitus with chronic kidney disease (HCC)   AF (paroxysmal atrial fibrillation) (HCC)   Diabetic gastroparesis (HCC)   Protein calorie malnutrition (HCC)   GERD (gastroesophageal reflux disease)  Acute on chronic respiratory distress/hypoxia, improving Metastatic pulmonary disease, unknown primary Acute bronchitis with reactive airway disease - We will discharge the patient on 4 more days of oral prednisone, Z-Pak.  Pro Air has been prescribed -CT of the chest is consistent with primary pulmonary metastatic disease of unknown type.  Advised to follow-up outpatient with primary care provider and also cancer Center for further work-up and treatment plan.  Patient and her next of kin both are aware of this. - 2 L nasal cannula arrangements made for home.  She is 87% on room air especially with ambulation and quickly corrects to 97% on 2 L nasal cannula. -Echocardiogram shows ejection fraction 60 to 65% with grade 1 diastolic dysfunction  History of paroxysmal atrial fibrillation - Resume her home medications.  Needs to follow-up outpatient with cardiology and primary care provider to talk about long-term anticoagulation.  History of coronary artery disease status post CABG and PCI -Currently asymptomatic.  She is chest pain-free.  Continue her home  regimen  CKD stage II-3 - Stable.  Essential hypertension -Resume her home medications  Hyperlipidemia -Continue Crestor  Peripheral arterial disease -Continue  aspirin and Plavix  Well-controlled diabetes mellitus type 2 - She is insulin-dependent.  Resume her home regimen.  Stage II decubitus ulcer -Local wound care  GERD -PPI  Patient was on Lovenox while she was here Her significant other/next of kin is at the bedside  discharge patient home in stable condition today.  Discharge Instructions   Allergies as of 06/19/2018      Reactions   Latex    Neosporin [neomycin-polymyxin-gramicidin]    Sulfa Antibiotics Nausea And Vomiting      Medication List    TAKE these medications   acetaminophen 325 MG tablet Commonly known as:  TYLENOL Take 650 mg by mouth every 6 (six) hours as needed.   albuterol 108 (90 Base) MCG/ACT inhaler Commonly known as:  PROVENTIL HFA;VENTOLIN HFA Inhale 2 puffs into the lungs every 6 (six) hours as needed for wheezing or shortness of breath.   amLODipine 10 MG tablet Commonly known as:  NORVASC Take 10 mg by mouth daily.   aspirin 81 MG tablet Take 81 mg by mouth daily.   azithromycin 250 MG tablet Commonly known as:  ZITHROMAX Take 2 tablets (500 mg total) by mouth daily for 1 day, THEN 1 tablet (250 mg total) daily for 4 days. Start taking on:  06/19/2018   B-D ULTRAFINE III SHORT PEN 31G X 8 MM Misc Generic drug:  Insulin Pen Needle USE AS DIRECTED   CALCIUM 1200 PO Take 1 tablet by mouth.   clopidogrel 75 MG tablet Commonly known as:  PLAVIX Take 1 tablet (75 mg total) by mouth daily.   [employer] VITAMIN D 50 MCG (2000 UT) Caps Generic drug:  Cholecalciferol TAKE ONE CAPSULE BY MOUTH DAILY   Fish Oil 1200 MG Cpdr Take 2 tablets by mouth at bedtime.   furosemide 40 MG tablet Commonly known as:  LASIX Take 1 tablet (40 mg total) by mouth daily.   hydrALAZINE 25 MG tablet Commonly known as:  APRESOLINE Take 1 tablet (25 mg total) by mouth daily as needed. What changed:  when to take this   LEVEMIR FLEXTOUCH 100 UNIT/ML Pen Generic drug:  Insulin Detemir INJECT 80 UNITS INTO THE  SKIN 2 TIMES DAILY. What changed:  See the new instructions.   lisinopril 20 MG tablet Commonly known as:  PRINIVIL,ZESTRIL TAKE 1 TABLET (20 MG TOTAL) BY MOUTH DAILY.   metoCLOPramide 5 MG tablet Commonly known as:  REGLAN TAKE 1 TABLET BY MOUTH 3 TIMES A DAY BEFORE MEALS AND 1 AT BEDTIME What changed:    how much to take  how to take this  when to take this   niacin 750 MG CR tablet Commonly known as:  NIASPAN TAKE 1 TABLET BY MOUTH EVERY DAY AT BEDTIME What changed:  when to take this   nitroGLYCERIN 0.4 MG SL tablet Commonly known as:  NITROSTAT Place 0.4 mg under the tongue every 5 (five) minutes as needed.   pantoprazole 40 MG tablet Commonly known as:  PROTONIX TAKE 1 TABLET BY MOUTH EVERY DAY   phenylephrine 10 MG Tabs tablet Commonly known as:  SUDAFED PE Take 10 mg by mouth 4 (four) times daily.   predniSONE 20 MG tablet Commonly known as:  DELTASONE Take 2 tablets (40 mg total) by mouth daily with breakfast for 4 days. Start taking on:  06/20/2018   promethazine 25 MG  tablet Commonly known as:  PHENERGAN Take 1 tablet (25 mg total) by mouth every 8 (eight) hours as needed for nausea or vomiting.   rosuvastatin 10 MG tablet Commonly known as:  CRESTOR TAKE 1 TABLET BY MOUTH EVERY DAY   TRADJENTA 5 MG Tabs tablet Generic drug:  linagliptin TAKE 1 TABLET BY MOUTH EVERY DAY What changed:  how much to take            Durable Medical Equipment  (From admission, onward)         Start     Ordered   06/19/18 1020  For home use only DME oxygen  Once    Question Answer Comment  Mode or (Route) Nasal cannula   Liters per Minute 2   Frequency Continuous (stationary and portable oxygen unit needed)   Oxygen delivery system Gas      06/19/18 1019         Follow-up Information    Susy Frizzle, MD. Schedule an appointment as soon as possible for a visit in 1 week(s).   Specialty:  Family Medicine Contact information: [address] [place] [address] [phone]        Lorretta Harp, MD .   Specialties:  Cardiology, Radiology Contact information: [address] [place] [address] [phone]        [facility] Schedule an appointment as soon as possible for a visit in 1 week(s).   Why:  [phone]         Allergies  Allergen Reactions  . Latex   . Neosporin [Neomycin-Polymyxin-Gramicidin]   . Sulfa Antibiotics Nausea And Vomiting    You were cared for by a hospitalist during your hospital stay. If you have any questions about your discharge medications or the care you received while you were in the hospital after you are discharged, you can call the unit and asked to speak with the hospitalist on call if the hospitalist that took care of you is not available. Once you are discharged, your primary care physician will handle any further medical issues. Please note that no refills for any discharge medications will be authorized once you are discharged, as it is imperative that you return to your primary care physician (or establish a relationship with a primary care physician if you do not have one) for your aftercare needs so that they can reassess your need for medications and monitor your lab values.  Consultations:  None   Procedures/Studies: Ct Chest W Contrast  Result Date: 06/18/2018 CLINICAL DATA:  Changes consistent with pulmonary metastatic disease on recent chest x-ray EXAM: CT CHEST WITH CONTRAST TECHNIQUE: Multidetector CT imaging of the chest was performed during intravenous contrast administration. CONTRAST:  30mL OMNIPAQUE IOHEXOL 300 MG/ML  SOLN COMPARISON:  Chest x-ray from the previous day FINDINGS: Cardiovascular: Thoracic aorta demonstrates atherosclerotic calcifications without aneurysmal dilatation or dissection. Changes of coronary bypass grafting are noted. No cardiac enlargement is seen. Coronary calcifications are seen. The  pulmonary artery demonstrates a normal branching pattern without central pulmonary emboli. Loop recorder is noted in the left chest wall anteriorly. Mediastinum/Nodes: Thoracic inlet is within normal limits. Scattered lymph nodes are noted throughout the mediastinum. The largest of these lies in the subcarinal region measuring 19 mm in short axis. Hilar adenopathy is noted bilaterally most prominent in the right infrahilar region measuring 18 mm in short axis. This likely represents a conglomeration of multiple smaller nodes. In  the right suprahilar region and radiating into the upper lobe on the right there is a ovoid soft tissue mass identified which measures 4.6 by 2.6 cm in greatest dimension. Some narrowing of the right upper lobe bronchus is noted although it appears patent. The esophagus as visualized is within normal limits. Lungs/Pleura: A large right-sided pleural effusion is noted. Multiple, too numerous to count pulmonary nodules are identified consistent with metastatic disease. The largest of these lies in the right upper lobe along the major fissure measuring 3.6 by 2.5 cm in greatest dimension. The large central right suprahilar mass lesion suggests a primary etiology with diffuse pulmonary metastatic disease. Bronchoscopic evaluation with biopsy is recommended. Upper Abdomen: Visualized upper abdomen demonstrates the adrenal glands to be within normal limits. Nonobstructing left renal stone is noted which measures 6 mm in greatest dimension. Renal vascular calcifications are noted as well. The remainder of the upper abdomen is unremarkable. Musculoskeletal: No acute bony abnormality is noted. Degenerative changes of the thoracic spine are seen. 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 Right-sided pleural effusion is noted as well. Referral for  multidisciplinary workup is recommended likely to include PET-CT and tissue sampling. Nonobstructing left renal stone. Aortic Atherosclerosis (ICD10-I70.0). Electronically Signed   By: Inez Catalina M.D.   On: 06/18/2018 09:58   Dg Chest Port 1 View  Result Date: 06/17/2018 CLINICAL DATA:  Dyspnea today EXAM: PORTABLE CHEST 1 VIEW COMPARISON:  06/20/2010 FINDINGS: Multiple bilateral masslike opacities are seen scattered throughout the lungs consistent with metastasis. Heart size and mediastinal contours are stable with aortic atherosclerosis. Median sternotomy sutures are in place. Cardiac monitoring device projects over the left lateral hemithorax. No aggressive osseous lesions. Mild vascular congestion is noted. 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 Mild vascular congestion. Electronically Signed   By: Ashley Royalty M.D.   On: 06/17/2018 22:17      Subjective: Feels much better and wants to go home.  Still requiring 2 L nasal cannula with some ambulation.  General = no fevers, chills, dizziness, malaise, fatigue HEENT/EYES = negative for pain, redness, loss of vision, double vision, blurred vision, loss of hearing, sore throat, hoarseness, dysphagia Cardiovascular= negative for chest pain, palpitation, murmurs, lower extremity swelling Respiratory/lungs= negative for shortness of breath, cough, hemoptysis, wheezing, mucus production Gastrointestinal= negative for nausea, vomiting,, abdominal pain, melena, hematemesis Genitourinary= negative for Dysuria, Hematuria, Change in Urinary Frequency MSK = Negative for arthralgia, myalgias, Back Pain, Joint swelling  Neurology= Negative for headache, seizures, numbness, tingling  Psychiatry= Negative for anxiety, depression, suicidal and homocidal ideation Allergy/Immunology= Medication/Food allergy as listed  Skin= Negative for Rash, lesions, ulcers, itching    Discharge Exam: Vitals:   06/19/18 0930 06/19/18 0935  BP:     Pulse:    Resp:    Temp:    SpO2: (!) 87% 96%   Vitals:   06/18/18 2351 06/19/18 0741 06/19/18 0930 06/19/18 0935  BP: (!) 164/60 (!) 158/64    Pulse: 75 72    Resp:      Temp: 97.9 F (36.6 C) 97.7 F (36.5 C)    TempSrc: Oral Oral    SpO2: 97% 97% (!) 87% 96%  Weight:      Height:        General: Pt is alert, awake, not in acute distress Cardiovascular: RRR, S1/S2 +, no rubs, no gallops Respiratory: CTA bilaterally, no wheezing, no rhonchi Abdominal: Soft, NT,  ND, bowel sounds + Extremities: no edema, no cyanosis    The results of significant diagnostics from this hospitalization (including imaging, microbiology, ancillary and laboratory) are listed below for reference.     Microbiology: No results found for this or any previous visit (from the past 240 hour(s)).   Labs: BNP (last 3 results) Recent Labs    06/17/18 2114  BNP 93.8   Basic Metabolic Panel: Recent Labs  Lab 06/17/18 2114 06/18/18 0514  NA 143 143  K 3.8 3.8  CL 105 105  CO2 29 27  GLUCOSE 149* 175*  BUN 17 15  CREATININE 1.07* 1.01*  CALCIUM 9.1 8.9   Liver Function Tests: Recent Labs  Lab 06/17/18 2114  AST 12*  ALT 7  ALKPHOS 51  BILITOT 0.4  PROT 6.2*  ALBUMIN 2.6*   No results for input(s): LIPASE, AMYLASE in the last 168 hours. No results for input(s): AMMONIA in the last 168 hours. CBC: Recent Labs  Lab 06/17/18 2114 06/18/18 0514  WBC 11.1* 12.3*  NEUTROABS 8.5*  --   HGB 12.1 12.1  HCT 41.1 40.2  MCV 97.9 95.0  PLT 189 185   Cardiac Enzymes: No results for input(s): CKTOTAL, CKMB, CKMBINDEX, TROPONINI in the last 168 hours. BNP: Invalid input(s): POCBNP CBG: Recent Labs  Lab 06/18/18 0736 06/18/18 1105 06/18/18 1704 06/18/18 2138 06/19/18 0739  GLUCAP 188* 255* 221* 218* 144*   D-Dimer No results for input(s): DDIMER in the last 72 hours. Hgb A1c No results for input(s): HGBA1C in the last 72 hours. Lipid Profile No results for input(s):  CHOL, HDL, LDLCALC, TRIG, CHOLHDL, LDLDIRECT in the last 72 hours. Thyroid function studies No results for input(s): TSH, T4TOTAL, T3FREE, THYROIDAB in the last 72 hours.  Invalid input(s): FREET3 Anemia work up No results for input(s): VITAMINB12, FOLATE, FERRITIN, TIBC, IRON, RETICCTPCT in the last 72 hours. Urinalysis    Component Value Date/Time   COLORURINE YELLOW 02/22/2013 1037   APPEARANCEUR CLEAR 02/22/2013 1037   LABSPEC 1.019 02/22/2013 1037   PHURINE 5.5 02/22/2013 1037   GLUCOSEU NEG 02/22/2013 1037   HGBUR NEG 02/22/2013 1037   BILIRUBINUR NEG 02/22/2013 1037   KETONESUR NEG 02/22/2013 1037   PROTEINUR NEG 02/22/2013 1037   UROBILINOGEN 0.2 02/22/2013 1037   NITRITE NEG 02/22/2013 1037   LEUKOCYTESUR LARGE (A) 02/22/2013 1037   Sepsis Labs Invalid input(s): PROCALCITONIN,  WBC,  LACTICIDVEN Microbiology No results found for this or any previous visit (from the past 240 hour(s)).   Time coordinating discharge:  I have spent 35 minutes face to face with the patient and on the ward discussing the patients care, assessment, plan and disposition with other care givers. >50% of the time was devoted counseling the patient about the risks and benefits of treatment/Discharge disposition and coordinating care.   SIGNED:   Damita Lack, MD  [facility] 06/19/2018, 10:24 AM Pager   If 7PM-7AM, please contact night-coverage [email or web address] Password [email or web address]

## 2018-06-19 NOTE — Progress Notes (Signed)
SATURATION QUALIFICATIONS: (This note is used to comply with regulatory documentation for home oxygen)  Patient Saturations on Room Air at Rest = 87%  Patient Saturations on Room Air while Ambulating = 82%  Patient Saturations on 1 Liters of oxygen while Ambulating = 90%  Please briefly explain why patient needs home oxygen: SOB and hypoxia related to physical deconditioning and metastatic disease to lungs.

## 2018-06-21 ENCOUNTER — Telehealth: Payer: Self-pay | Admitting: Internal Medicine

## 2018-06-21 NOTE — Telephone Encounter (Signed)
Cld and spoke to the pt's friend Vania Rea to schedule an appt to see Dr. Julien Nordmann on 11/20 at 230pm. Aware to arrive 30 minutes early for labs.

## 2018-06-22 ENCOUNTER — Other Ambulatory Visit: Payer: Self-pay

## 2018-06-23 ENCOUNTER — Encounter: Payer: Self-pay | Admitting: *Deleted

## 2018-06-23 ENCOUNTER — Telehealth: Payer: Self-pay | Admitting: Internal Medicine

## 2018-06-23 ENCOUNTER — Inpatient Hospital Stay: Payer: Medicare Other | Attending: Internal Medicine | Admitting: Internal Medicine

## 2018-06-23 ENCOUNTER — Other Ambulatory Visit: Payer: Self-pay | Admitting: *Deleted

## 2018-06-23 ENCOUNTER — Inpatient Hospital Stay: Payer: Medicare Other

## 2018-06-23 ENCOUNTER — Encounter: Payer: Self-pay | Admitting: Internal Medicine

## 2018-06-23 DIAGNOSIS — I2510 Atherosclerotic heart disease of native coronary artery without angina pectoris: Secondary | ICD-10-CM | POA: Insufficient documentation

## 2018-06-23 DIAGNOSIS — C3490 Malignant neoplasm of unspecified part of unspecified bronchus or lung: Secondary | ICD-10-CM

## 2018-06-23 DIAGNOSIS — R0602 Shortness of breath: Secondary | ICD-10-CM | POA: Insufficient documentation

## 2018-06-23 DIAGNOSIS — J90 Pleural effusion, not elsewhere classified: Secondary | ICD-10-CM | POA: Diagnosis not present

## 2018-06-23 DIAGNOSIS — R918 Other nonspecific abnormal finding of lung field: Secondary | ICD-10-CM | POA: Insufficient documentation

## 2018-06-23 DIAGNOSIS — I4891 Unspecified atrial fibrillation: Secondary | ICD-10-CM | POA: Insufficient documentation

## 2018-06-23 DIAGNOSIS — I129 Hypertensive chronic kidney disease with stage 1 through stage 4 chronic kidney disease, or unspecified chronic kidney disease: Secondary | ICD-10-CM | POA: Insufficient documentation

## 2018-06-23 DIAGNOSIS — N189 Chronic kidney disease, unspecified: Secondary | ICD-10-CM | POA: Diagnosis not present

## 2018-06-23 DIAGNOSIS — R590 Localized enlarged lymph nodes: Secondary | ICD-10-CM | POA: Insufficient documentation

## 2018-06-23 DIAGNOSIS — E559 Vitamin D deficiency, unspecified: Secondary | ICD-10-CM | POA: Diagnosis not present

## 2018-06-23 DIAGNOSIS — E1122 Type 2 diabetes mellitus with diabetic chronic kidney disease: Secondary | ICD-10-CM | POA: Insufficient documentation

## 2018-06-23 DIAGNOSIS — R911 Solitary pulmonary nodule: Secondary | ICD-10-CM

## 2018-06-23 LAB — CMP (CANCER CENTER ONLY)
ALBUMIN: 2.6 g/dL — AB (ref 3.5–5.0)
ALK PHOS: 52 U/L (ref 38–126)
ALT: 6 U/L (ref 0–44)
AST: 11 U/L — AB (ref 15–41)
Anion gap: 7 (ref 5–15)
BILIRUBIN TOTAL: 0.3 mg/dL (ref 0.3–1.2)
BUN: 31 mg/dL — AB (ref 8–23)
CALCIUM: 9 mg/dL (ref 8.9–10.3)
CO2: 33 mmol/L — ABNORMAL HIGH (ref 22–32)
CREATININE: 0.95 mg/dL (ref 0.44–1.00)
Chloride: 107 mmol/L (ref 98–111)
GFR, EST NON AFRICAN AMERICAN: 58 mL/min — AB (ref >60)
GFR, Est AFR Am: >60 mL/min (ref >60)
GLUCOSE: 127 mg/dL — AB (ref 70–99)
Potassium: 4 mmol/L (ref 3.5–5.1)
Sodium: 147 mmol/L — ABNORMAL HIGH (ref 135–145)
Total Protein: 6 g/dL — ABNORMAL LOW (ref 6.5–8.1)

## 2018-06-23 LAB — CBC WITH DIFFERENTIAL (CANCER CENTER ONLY)
ABS IMMATURE GRANULOCYTES: 0.19 10*3/uL — AB (ref 0.00–0.07)
Basophils Absolute: 0 10*3/uL (ref 0.0–0.1)
Basophils Relative: 0 %
EOS ABS: 0 10*3/uL (ref 0.0–0.5)
EOS PCT: 0 %
HCT: 41.4 % (ref 36.0–46.0)
Hemoglobin: 12.6 g/dL (ref 12.0–15.0)
Immature Granulocytes: 1 %
Lymphocytes Relative: 5 %
Lymphs Abs: 1 10*3/uL (ref 0.7–4.0)
MCH: 29.2 pg (ref 26.0–34.0)
MCHC: 30.4 g/dL (ref 30.0–36.0)
MCV: 95.8 fL (ref 80.0–100.0)
MONO ABS: 0.6 10*3/uL (ref 0.1–1.0)
MONOS PCT: 3 %
Neutro Abs: 15.8 10*3/uL — ABNORMAL HIGH (ref 1.7–7.7)
Neutrophils Relative %: 91 %
Platelet Count: 226 10*3/uL (ref 150–400)
RBC: 4.32 MIL/uL (ref 3.87–5.11)
RDW: 14.3 % (ref 11.5–15.5)
WBC: 17.6 10*3/uL — AB (ref 4.0–10.5)
nRBC: 0 % (ref 0.0–0.2)

## 2018-06-23 NOTE — Progress Notes (Signed)
[place] [phone]   [phone]  CONSULT NOTE  REFERRING PHYSICIAN: Dr. Jenna Luo  REASON FOR CONSULTATION:  72 years old female with highly suspicious lung cancer.  HPI [name] is a 72 y.o. female with past medical history significant for hypertension, diabetes mellitus, chronic kidney disease, coronary artery disease, atrial fibrillation, dyslipidemia, history of GI bleed, history of a stroke in 2014, vitamin D deficiency, peripheral vascular disease, obstructive sleep apnea as well as long history for smoking but quit 5 years ago.  The patient presented to the emergency department at [facility] complaining of shortness of breath as well as cough productive of whitish sputum that has been getting worse over one month.  Chest x-ray was performed on June 17, 2018 and that showed findings concerning for diffuse pulmonary metastasis with mild vascular congestion.  This was followed by CT scan of the chest with contrast on June 18, 2014 and that showed a large right-sided pleural effusion in addition to multiple too numerous to count pulmonary nodules consistent with metastatic disease.  The largest of the slides and the right upper lobe along the major fissure and measures 3.6 x 2.5 cm.  The large central right suprahilar mass lesion suggested primary etiology with diffuse pulmonary metastatic disease.  There was also scattered lymph nodes in the mediastinum the largest is in the subcarinal region and measured 1.9 cm there was also bilateral hilar adenopathy with the most prominent in the right infrahilar region measuring 1.8 cm in short axis.  There was also an opioid soft tissue mass identified in the right suprahilar region and radiating into the upper lobe on the right measuring 4.6 x 2.6 cm.  There was some narrowing of the right upper lobe bronchus.  The patient was treated in the hospital with azithromycin and prednisone and  was discharged home the next day.  She was advised to contact the office for evaluation. When seen today the patient continues to complain of shortness of breath at baseline increased with exertion and she is currently on home oxygen started during her hospitalization.  She also has cough productive of whitish sputum but no significant chest pain or hemoptysis.  She also complaining of sore throat.  She has a weight loss of around 20 pounds in the last 6 months.  She also has intermittent nausea and vomiting as well as intermittent headache.  She denied having any visual changes she has no diarrhea or constipation. Family history significant for father with cancer of unknown type, mother had female cancer and sister had colon cancer. The patient is single and has no children.  She was accompanied by her boyfriend [name].  She is currently retired and used to work for a [employer], [employer].  She has a history for smoking 1 pack/day for around 55 years and quit 5 years ago.  She has no history of alcohol or drug abuse. HPI  Past Medical History:  Diagnosis Date  . Atrial fibrillation (HCC)    paroxysmal  . CAD (coronary artery disease)    X5  . Chronic kidney disease   . Diabetes mellitus   . Gastritis   . GI bleeding   . Hiatal hernia   . Hyperglycemia   . Hyperlipidemia   . Hypertension   . Obesity   . Obstructive sleep apnea   . Osteopenia   . Peripheral artery disease (HCC)    lower extremities  . Stroke ([place])   .  Systolic murmur   . Ulcer   . Vitamin D deficiency     Past Surgical History:  Procedure Laterality Date  . ANGIOPLASTY / STENTING FEMORAL Right    leg  . Carotid doppler  10/13/2011   right ICA-49% diameter reduction and left ICA with 50%-69% diameter reduction  . CORONARY ARTERY BYPASS GRAFT  05/22/2010  . Heart bypass    . Myoview Stress  08/13/2010   inferolateral scar without ischemia    Family History  Problem Relation Age of Onset  . Cancer Mother        Did  not know what kind  . COPD Brother   . Diabetes Sister   . Heart disease Brother   . Colon cancer Neg Hx   . Colon polyps Neg Hx   . Gallbladder disease Neg Hx   . Kidney disease Neg Hx   . Esophageal cancer Neg Hx     Social History Social History   Tobacco Use  . Smoking status: Former Smoker    Last attempt to quit: 01/22/2010    Years since quitting: 8.4  . Smokeless tobacco: Never Used  Substance Use Topics  . Alcohol use: No  . Drug use: No    Allergies  Allergen Reactions  . Latex   . Neosporin [Neomycin-Polymyxin-Gramicidin]   . Sulfa Antibiotics Nausea And Vomiting    Current Outpatient Medications  Medication Sig Dispense Refill  . acetaminophen (TYLENOL) 325 MG tablet Take 650 mg by mouth every 6 (six) hours as needed.    [name] albuterol (PROVENTIL HFA;VENTOLIN HFA) 108 (90 Base) MCG/ACT inhaler Inhale 2 puffs into the lungs every 6 (six) hours as needed for wheezing or shortness of breath. 1 Inhaler 2  . amLODipine (NORVASC) 10 MG tablet Take 10 mg by mouth daily.      [name] aspirin 81 MG tablet Take 81 mg by mouth daily.     [name] azithromycin (ZITHROMAX) 250 MG tablet Take 2 tablets (500 mg total) by mouth daily for 1 day, THEN 1 tablet (250 mg total) daily for 4 days. 6 tablet 0  . B-D ULTRAFINE III SHORT PEN 31G X 8 MM MISC USE AS DIRECTED 100 each 2  . Calcium Carbonate-Vit D-Min (CALCIUM 1200 PO) Take 1 tablet by mouth.     . clopidogrel (PLAVIX) 75 MG tablet Take 1 tablet (75 mg total) by mouth daily. 90 tablet 3  . [employer] VITAMIN D 2000 units CAPS TAKE ONE CAPSULE BY MOUTH DAILY 100 each 2  . furosemide (LASIX) 40 MG tablet Take 1 tablet (40 mg total) by mouth daily. (Patient not taking: Reported on 04/02/2018) 90 tablet 3  . hydrALAZINE (APRESOLINE) 25 MG tablet Take 1 tablet (25 mg total) by mouth daily as needed. (Patient taking differently: Take 25 mg by mouth daily. ) 90 tablet 3  . LEVEMIR FLEXTOUCH 100 UNIT/ML Pen INJECT 80 UNITS INTO THE SKIN 2 TIMES DAILY.  (Patient taking differently: Inject 50 Units into the skin 2 (two) times daily. ) 135 mL 3  . lisinopril (PRINIVIL,ZESTRIL) 20 MG tablet TAKE 1 TABLET (20 MG TOTAL) BY MOUTH DAILY. 90 tablet 3  . metoCLOPramide (REGLAN) 5 MG tablet TAKE 1 TABLET BY MOUTH 3 TIMES A DAY BEFORE MEALS AND 1 AT BEDTIME (Patient taking differently: Take 5 mg by mouth See admin instructions. TAKE 1 TABLET BY MOUTH 3 TIMES A DAY BEFORE MEALS AND 1 AT BEDTIME) 360 tablet 1  . niacin (NIASPAN) 750 MG CR tablet TAKE 1  TABLET BY MOUTH EVERY DAY AT BEDTIME (Patient taking differently: Take 750 mg by mouth every morning. ) 90 tablet 3  . nitroGLYCERIN (NITROSTAT) 0.4 MG SL tablet Place 0.4 mg under the tongue every 5 (five) minutes as needed.    . Omega-3 Fatty Acids (FISH OIL) 1200 MG CPDR Take 2 tablets by mouth at bedtime.    . pantoprazole (PROTONIX) 40 MG tablet TAKE 1 TABLET BY MOUTH EVERY DAY (Patient taking differently: Take 40 mg by mouth daily. ) 90 tablet 3  . phenylephrine (SUDAFED PE) 10 MG TABS tablet Take 10 mg by mouth 4 (four) times daily.    . predniSONE (DELTASONE) 20 MG tablet Take 2 tablets (40 mg total) by mouth daily with breakfast for 4 days. 8 tablet 0  . promethazine (PHENERGAN) 25 MG tablet Take 1 tablet (25 mg total) by mouth every 8 (eight) hours as needed for nausea or vomiting. 20 tablet 0  . rosuvastatin (CRESTOR) 10 MG tablet TAKE 1 TABLET BY MOUTH EVERY DAY (Patient taking differently: Take 10 mg by mouth daily. ) 90 tablet 1  . TRADJENTA 5 MG TABS tablet TAKE 1 TABLET BY MOUTH EVERY DAY (Patient taking differently: Take 5 mg by mouth daily. ) 90 tablet 4   No current facility-administered medications for this visit.     Review of Systems  Constitutional: positive for anorexia, fatigue and weight loss Eyes: negative Ears, nose, mouth, throat, and face: negative Respiratory: positive for cough, dyspnea on exertion and sputum Cardiovascular: negative Gastrointestinal: positive for nausea and  vomiting Genitourinary:negative Integument/breast: negative Hematologic/lymphatic: negative Musculoskeletal:negative Neurological: negative Behavioral/Psych: negative Endocrine: negative Allergic/Immunologic: negative  Physical Exam  XVQ:MGQQP, healthy, no distress, well nourished, well developed and anxious SKIN: skin color, texture, turgor are normal, no rashes or significant lesions HEAD: Normocephalic, No masses, lesions, tenderness or abnormalities EYES: normal, PERRLA, Conjunctiva are pink and non-injected EARS: External ears normal, Canals clear OROPHARYNX:no exudate, no erythema and lips, buccal mucosa, and tongue normal  NECK: supple, no adenopathy, no JVD LYMPH:  no palpable lymphadenopathy, no hepatosplenomegaly BREAST:not examined LUNGS: expiratory wheezes bilaterally, scattered rhonchi bilaterally HEART: regular rate & rhythm, no murmurs and no gallops ABDOMEN:abdomen soft, non-tender, normal bowel sounds and no masses or organomegaly BACK: Back symmetric, no curvature., No CVA tenderness EXTREMITIES:no joint deformities, effusion, or inflammation, no edema  NEURO: alert & oriented x 3 with fluent speech, no focal motor/sensory deficits  PERFORMANCE STATUS: ECOG 1  LABORATORY DATA: Lab Results  Component Value Date   WBC 17.6 (H) 06/23/2018   HGB 12.6 06/23/2018   HCT 41.4 06/23/2018   MCV 95.8 06/23/2018   PLT 226 06/23/2018      Chemistry      Component Value Date/Time   NA 143 06/18/2018 0514   K 3.8 06/18/2018 0514   CL 105 06/18/2018 0514   CO2 27 06/18/2018 0514   BUN 15 06/18/2018 0514   CREATININE 1.01 (H) 06/18/2018 0514   CREATININE 1.14 (H) 04/02/2018 1155      Component Value Date/Time   CALCIUM 8.9 06/18/2018 0514   ALKPHOS 51 06/17/2018 2114   AST 12 (L) 06/17/2018 2114   ALT 7 06/17/2018 2114   BILITOT 0.4 06/17/2018 2114       RADIOGRAPHIC STUDIES: Ct Chest W Contrast  Result Date: 06/18/2018 CLINICAL DATA:  Changes  consistent with pulmonary metastatic disease on recent chest x-ray EXAM: CT CHEST WITH CONTRAST TECHNIQUE: Multidetector CT imaging of the chest was performed during intravenous contrast administration. CONTRAST:  41mL OMNIPAQUE IOHEXOL 300 MG/ML  SOLN COMPARISON:  Chest x-ray from the previous day FINDINGS: Cardiovascular: Thoracic aorta demonstrates atherosclerotic calcifications without aneurysmal dilatation or dissection. Changes of coronary bypass grafting are noted. No cardiac enlargement is seen. Coronary calcifications are seen. The pulmonary artery demonstrates a normal branching pattern without central pulmonary emboli. Loop recorder is noted in the left chest wall anteriorly. Mediastinum/Nodes: Thoracic inlet is within normal limits. Scattered lymph nodes are noted throughout the mediastinum. The largest of these lies in the subcarinal region measuring 19 mm in short axis. Hilar adenopathy is noted bilaterally most prominent in the right infrahilar region measuring 18 mm in short axis. This likely represents a conglomeration of multiple smaller nodes. In the right suprahilar region and radiating into the upper lobe on the right there is a ovoid soft tissue mass identified which measures 4.6 by 2.6 cm in greatest dimension. Some narrowing of the right upper lobe bronchus is noted although it appears patent. The esophagus as visualized is within normal limits. Lungs/Pleura: A large right-sided pleural effusion is noted. Multiple, too numerous to count pulmonary nodules are identified consistent with metastatic disease. The largest of these lies in the right upper lobe along the major fissure measuring 3.6 by 2.5 cm in greatest dimension. The large central right suprahilar mass lesion suggests a primary etiology with diffuse pulmonary metastatic disease. Bronchoscopic evaluation with biopsy is recommended. Upper Abdomen: Visualized upper abdomen demonstrates the adrenal glands to be within normal limits.  Nonobstructing left renal stone is noted which measures 6 mm in greatest dimension. Renal vascular calcifications are noted as well. The remainder of the upper abdomen is unremarkable. Musculoskeletal: No acute bony abnormality is noted. Degenerative changes of the thoracic spine are seen. 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 Right-sided pleural effusion is noted as well. Referral for multidisciplinary workup is recommended likely to include PET-CT and tissue sampling. Nonobstructing left renal stone. Aortic Atherosclerosis (ICD10-I70.0). Electronically Signed   By: Inez Catalina M.D.   On: 06/18/2018 09:58   Dg Chest Port 1 View  Result Date: 06/17/2018 CLINICAL DATA:  Dyspnea today EXAM: PORTABLE CHEST 1 VIEW COMPARISON:  06/20/2010 FINDINGS: Multiple bilateral masslike opacities are seen scattered throughout the lungs consistent with metastasis. Heart size and mediastinal contours are stable with aortic atherosclerosis. Median sternotomy sutures are in place. Cardiac monitoring device projects over the left lateral hemithorax. No aggressive osseous lesions. Mild vascular congestion is noted. 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 Mild vascular congestion. Electronically Signed   By: Ashley Royalty M.D.   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  I personally and independently reviewed the scan images and  discussed the result and showed the images to the patient and her boyfriend. I recommended for the patient to proceed with ultrasound-guided right thoracentesis tomorrow for therapeutic and diagnostic purposes.  If the cytology of the pleural fluid is negative, I will refer the patient to pulmonary medicine or cardiothoracic surgery for consideration of bronchoscopy and tissue diagnosis or we could consider her  for CT-guided core biopsy of one of the pulmonary nodules. I will arrange for the patient to have a PET scan as well as MRI of the brain to complete the staging work-up of her disease. For the shortness of breath she will continue with the home oxygen for now. I will arrange for the patient to come back for follow-up visit in around 2 weeks for reevaluation and more detailed discussion of her treatment options after we establish the diagnosis and complete the staging work-up. The patient was advised to call immediately if she has any concerning symptoms in the interval. The patient voices understanding of current disease status and treatment options and is in agreement with the current care plan.  All questions were answered. The patient knows to call the clinic with any problems, questions or concerns. We can certainly see the patient much sooner if necessary.  Thank you so much for allowing me to participate in the care of [name]. I will continue to follow up the patient with you and assist in her care.  I spent 40 minutes counseling the patient face to face. The total time spent in the appointment was 60 minutes.  Disclaimer: This note was dictated with voice recognition software. Similar sounding words can inadvertently be transcribed and may not be corrected upon review.   Eilleen Kempf June 23, 2018, 2:22 PM

## 2018-06-23 NOTE — Telephone Encounter (Signed)
Gave pt avs and calendar  °

## 2018-06-23 NOTE — Progress Notes (Signed)
Oncology Nurse Navigator Documentation  Oncology Nurse Navigator Flowsheets 06/23/2018  Navigator Location [facility]  Referral date to RadOnc/MedOnc 06/18/2018  Navigator Encounter Type Clinic/MDC/per Dr. Julien Nordmann, I arranged patient to get US thoracentesis tomorrow.  I spoke with patient at her visit today and updated her on thoracentesis appt and she verbalized understanding.    Patient Visit Type MedOnc  Treatment Phase Abnormal Scans  Barriers/Navigation Needs Education;Coordination of Care  Education Other  Interventions Coordination of Care;Education  Coordination of Care Other  Education Method Verbal  Acuity Level 2  Time Spent with Patient 47

## 2018-06-24 ENCOUNTER — Ambulatory Visit (HOSPITAL_COMMUNITY)
Admission: RE | Admit: 2018-06-24 | Discharge: 2018-06-24 | Disposition: A | Discharge status: Home / Self Care | Payer: Medicare Other | Source: Ambulatory Visit | Attending: Internal Medicine | Admitting: Internal Medicine

## 2018-06-24 ENCOUNTER — Ambulatory Visit (HOSPITAL_COMMUNITY)
Admission: RE | Admit: 2018-06-24 | Discharge: 2018-06-24 | Disposition: A | Discharge status: Home / Self Care | Payer: Medicare Other | Source: Ambulatory Visit | Attending: Student | Admitting: Student

## 2018-06-24 DIAGNOSIS — C7800 Secondary malignant neoplasm of unspecified lung: Secondary | ICD-10-CM | POA: Diagnosis not present

## 2018-06-24 DIAGNOSIS — J90 Pleural effusion, not elsewhere classified: Secondary | ICD-10-CM | POA: Insufficient documentation

## 2018-06-24 DIAGNOSIS — R091 Pleurisy: Secondary | ICD-10-CM | POA: Diagnosis not present

## 2018-06-24 DIAGNOSIS — Z9889 Other specified postprocedural states: Secondary | ICD-10-CM

## 2018-06-24 MED ORDER — LIDOCAINE HCL 1 % IJ SOLN
INTRAMUSCULAR | Status: AC
  Filled 2018-06-24: qty 20

## 2018-06-24 NOTE — Procedures (Signed)
PROCEDURE SUMMARY:  Successful image-guided right thoracentesis. Yielded 650 liters of hazy yellow fluid. Patient tolerated procedure well. No immediate complications.  Specimen was sent for labs. CXR ordered.  Claris Pong Louk PA-C 06/24/2018 3:02 PM

## 2018-06-25 ENCOUNTER — Inpatient Hospital Stay: Payer: Medicare Other | Admitting: Family Medicine

## 2018-06-25 ENCOUNTER — Other Ambulatory Visit: Payer: Self-pay | Admitting: Family Medicine

## 2018-06-25 ENCOUNTER — Ambulatory Visit (INDEPENDENT_AMBULATORY_CARE_PROVIDER_SITE_OTHER): Payer: Medicare Other | Admitting: Family Medicine

## 2018-06-25 ENCOUNTER — Telehealth: Payer: Self-pay | Admitting: *Deleted

## 2018-06-25 ENCOUNTER — Encounter: Payer: Self-pay | Admitting: Family Medicine

## 2018-06-25 VITALS — BP 120/60 | HR 86 | Temp 98.0°F | Resp 20 | Ht <= 58 in | Wt 197.0 lb

## 2018-06-25 DIAGNOSIS — Z09 Encounter for follow-up examination after completed treatment for conditions other than malignant neoplasm: Secondary | ICD-10-CM | POA: Diagnosis not present

## 2018-06-25 DIAGNOSIS — I6523 Occlusion and stenosis of bilateral carotid arteries: Secondary | ICD-10-CM

## 2018-06-25 DIAGNOSIS — I10 Essential (primary) hypertension: Secondary | ICD-10-CM | POA: Diagnosis not present

## 2018-06-25 MED ORDER — SILVER SULFADIAZINE 1 % EX CREA: 1.0000 "application " | TOPICAL_CREAM | Freq: Every day | CUTANEOUS | 0 refills | Status: DC

## 2018-06-25 MED ORDER — ALPRAZOLAM 0.5 MG PO TABS: 0.5000 mg | ORAL_TABLET | Freq: Three times a day (TID) | ORAL | 0 refills | Status: DC | PRN

## 2018-06-25 MED ORDER — HYDROCODONE-ACETAMINOPHEN 5-325 MG PO TABS: 1.0000 | ORAL_TABLET | Freq: Four times a day (QID) | ORAL | 0 refills | Status: AC | PRN

## 2018-06-25 NOTE — Progress Notes (Signed)
Subjective:    Patient ID: [name], female    DOB: 05-29-1946, 72 y.o.   MRN: [id number]  HPI  Patient was recently admitted to the hospital with acute hypoxic respiratory failure.  I have copied relevant portions of the discharge summary and included them below for my reference:  Admit date: 06/17/2018 Discharge date: 06/19/2018  Admitted From: Home Disposition: Home  Recommendations for Outpatient Follow-up:  1. Follow up with PCP in 1-2 weeks 2. Please obtain BMP/CBC in one week your next doctors visit.  3. Prednisone orally for 4 days.  Z-Pak prescribed.  Pro-air prescribed 4. Advised to follow-up outpatient with primary care provider and oncology.  Cancer center information provided.  This needs to happen hopefully within the next week or 2.  Patient is aware of this. 5. 2 L home oxygen arrangements made  Home Health: None Equipment/Devices: 2 L nasal cannula home oxygen Discharge Condition: Stable CODE STATUS: Full code Diet recommendation: Cardiac  Brief/Interim Summary: 72 year old with a history of A. fib, coronary artery disease status post CABG and PCI, CKD stage II-3, diabetes mellitus type 2, essential hypertension, hyperlipidemia came to the hospital with complains of cough and shortness of breath.  There were initial signs of possible slight fluid overload therefore Lasix was given.  Chest x-ray was suggestive of diffuse metastatic disease therefore CT of the chest was done which confirmed concerns for diffuse metastatic disease.  She was initially hypoxic requiring 4 L nasal cannula.  She aggressively received bronchodilator treatment along with steroids which she tolerated well. After extensive discussion with the patient we came to conclusion that patient will follow-up outpatient with a primary care provider within the next week for further work-up for her possible pulmonary malignancy.  I have also provided her with cancer center information to make  follow-up appointments as soon as she can.  Her long-term power of attorney/next of kin is also at bedside who is aware of this.  Patient is still requiring oxygen with ambulation as she desaturates to 87% on room air.  2 L nasal cannula oxygen arrangements have been made for her at home.  Otherwise medically stable to be discharged   Discharge Diagnoses:  Principal Problem:   Acute hypoxemic respiratory failure ([place]) Active Problems:   Hyperlipidemia   Hypertension   Sacral decubitus ulcer   PAD (peripheral artery disease) (HCC)   CAD (coronary artery disease) CABG x5 2011   CKD (chronic kidney disease) stage 3, GFR 30-59 ml/min (HCC)   Type 2 diabetes mellitus with chronic kidney disease (HCC)   AF (paroxysmal atrial fibrillation) (HCC)   Diabetic gastroparesis (HCC)   Protein calorie malnutrition (HCC)   GERD (gastroesophageal reflux disease)  Acute on chronic respiratory distress/hypoxia, improving Metastatic pulmonary disease, unknown primary Acute bronchitis with reactive airway disease - We will discharge the patient on 4 more days of oral prednisone, Z-Pak.  Pro Air has been prescribed -CT of the chest is consistent with primary pulmonary metastatic disease of unknown type.  Advised to follow-up outpatient with primary care provider and also cancer Center for further work-up and treatment plan.  Patient and her next of kin both are aware of this. - 2 L nasal cannula arrangements made for home.  She is 87% on room air especially with ambulation and quickly corrects to 97% on 2 L nasal cannula. -Echocardiogram shows ejection fraction 60 to 65% with grade 1 diastolic dysfunction  History of paroxysmal atrial fibrillation - Resume her home medications.  Needs  to follow-up outpatient with cardiology and primary care provider to talk about long-term anticoagulation.  History of coronary artery disease status post CABG and PCI -Currently asymptomatic.  She is chest pain-free.   Continue her home regimen  CKD stage II-3 - Stable.  Essential hypertension -Resume her home medications  Hyperlipidemia -Continue Crestor  Peripheral arterial disease -Continue aspirin and Plavix  Well-controlled diabetes mellitus type 2 - She is insulin-dependent.  Resume her home regimen.  Stage II decubitus ulcer -Local wound care  GERD -PPI  Patient was on Lovenox while she was here Her significant other/next of kin is at the bedside  discharge patient home in stable condition today.   06/25/18 Patient is here today with her primary caregiver.  She is currently on oxygen.  At rest, she is 95% on 2 L.  With ambulation, she quickly drops to 88% on room air and can only walk 15 or 20 feet before she must stop and rest.  She still requires oxygen.  She has seen the oncologist since discharge from the hospital.  A diagnostic thoracentesis was performed which was therapeutic as well and has helped her dyspnea.  The preliminary report shows no cancer cells.  Therefore her oncologist is recommended either referral to pulmonology for possible bronchoscopy guided biopsy of the right upper lobe mass versus cardiothoracic surgery consultation.  Patient has not yet heard back from the oncologist.  She does have an MRI as well as a PET scan scheduled for further staging.  We had a long discussion today about locations.  Patient is aware of the metastatic cancer cannot be cured by surgery and that her treatment option consist of chemotherapy.  At the present time she is still interested in pursuing full treatment.  However she is very tearful today and is resigned to the fact that this is a life-threatening diagnosis.  She does occasionally have anxiety due to this and like to have Xanax on hand to use sparingly occasionally when she feels overcome with anxiety or fear.  She also has occasional pain particularly in her lungs.  At the present time she does not feel that she needs pain  medication however she would like to have a prescription on hand that she can use if the pain gets worse.  She states that her breathing feels better.  She denies any fevers or chills or hemoptysis.  Pulmonary exam today shows diminished breath sounds bilaterally but no wheezes crackles or rails.  There is no pitting edema in her extremities. Past Medical History:  Diagnosis Date  . Atrial fibrillation (HCC)    paroxysmal  . CAD (coronary artery disease)    X5  . Chronic kidney disease   . Diabetes mellitus   . Gastritis   . GI bleeding   . Hiatal hernia   . Hyperglycemia   . Hyperlipidemia   . Hypertension   . Obesity   . Obstructive sleep apnea   . Osteopenia   . Peripheral artery disease (HCC)    lower extremities  . Stroke ([place])   . Systolic murmur   . Ulcer   . Vitamin D deficiency    Past Surgical History:  Procedure Laterality Date  . ANGIOPLASTY / STENTING FEMORAL Right    leg  . Carotid doppler  10/13/2011   right ICA-49% diameter reduction and left ICA with 50%-69% diameter reduction  . CORONARY ARTERY BYPASS GRAFT  05/22/2010  . Heart bypass    . Myoview Stress  08/13/2010  inferolateral scar without ischemia   Current Outpatient Medications on File Prior to Visit  Medication Sig Dispense Refill  . acetaminophen (TYLENOL) 325 MG tablet Take 650 mg by mouth every 6 (six) hours as needed.    [name] albuterol (PROVENTIL HFA;VENTOLIN HFA) 108 (90 Base) MCG/ACT inhaler Inhale 2 puffs into the lungs every 6 (six) hours as needed for wheezing or shortness of breath. 1 Inhaler 2  . amLODipine (NORVASC) 10 MG tablet Take 10 mg by mouth daily.      [name] aspirin 81 MG tablet Take 81 mg by mouth daily.     . B-D ULTRAFINE III SHORT PEN 31G X 8 MM MISC USE AS DIRECTED 100 each 2  . Calcium Carbonate-Vit D-Min (CALCIUM 1200 PO) Take 1 tablet by mouth.     . clopidogrel (PLAVIX) 75 MG tablet Take 1 tablet (75 mg total) by mouth daily. 90 tablet 3  . [employer] VITAMIN D 2000 units CAPS TAKE  ONE CAPSULE BY MOUTH DAILY 100 each 2  . furosemide (LASIX) 40 MG tablet Take 1 tablet (40 mg total) by mouth daily. 90 tablet 3  . hydrALAZINE (APRESOLINE) 25 MG tablet Take 1 tablet (25 mg total) by mouth daily as needed. (Patient taking differently: Take 25 mg by mouth daily. ) 90 tablet 3  . LEVEMIR FLEXTOUCH 100 UNIT/ML Pen INJECT 80 UNITS INTO THE SKIN 2 TIMES DAILY. (Patient taking differently: Inject 50 Units into the skin 2 (two) times daily. ) 135 mL 3  . lisinopril (PRINIVIL,ZESTRIL) 20 MG tablet TAKE 1 TABLET (20 MG TOTAL) BY MOUTH DAILY. 90 tablet 3  . metoCLOPramide (REGLAN) 5 MG tablet TAKE 1 TABLET BY MOUTH 3 TIMES A DAY BEFORE MEALS AND 1 AT BEDTIME (Patient taking differently: Take 5 mg by mouth See admin instructions. TAKE 1 TABLET BY MOUTH 3 TIMES A DAY BEFORE MEALS AND 1 AT BEDTIME) 360 tablet 1  . niacin (NIASPAN) 750 MG CR tablet TAKE 1 TABLET BY MOUTH EVERY DAY AT BEDTIME (Patient taking differently: Take 750 mg by mouth every morning. ) 90 tablet 3  . nitroGLYCERIN (NITROSTAT) 0.4 MG SL tablet Place 0.4 mg under the tongue every 5 (five) minutes as needed.    . Omega-3 Fatty Acids (FISH OIL) 1200 MG CPDR Take 2 tablets by mouth at bedtime.    . pantoprazole (PROTONIX) 40 MG tablet TAKE 1 TABLET BY MOUTH EVERY DAY 90 tablet 3  . phenylephrine (SUDAFED PE) 10 MG TABS tablet Take 10 mg by mouth 4 (four) times daily.    . promethazine (PHENERGAN) 25 MG tablet Take 1 tablet (25 mg total) by mouth every 8 (eight) hours as needed for nausea or vomiting. 20 tablet 0  . TRADJENTA 5 MG TABS tablet TAKE 1 TABLET BY MOUTH EVERY DAY (Patient taking differently: Take 5 mg by mouth daily. ) 90 tablet 4   No current facility-administered medications on file prior to visit.    Allergies  Allergen Reactions  . Latex   . Neosporin [Neomycin-Polymyxin-Gramicidin]   . Sulfa Antibiotics Nausea And Vomiting   Social History   Socioeconomic History  . Marital status: Single    Spouse name:  Not on file  . Number of children: 0  . Years of education: Not on file  . Highest education level: Not on file  Occupational History  . Occupation: Retired  Scientific laboratory technician  . Financial resource strain: Not on file  . Food insecurity:    Worry: Not on file  Inability: Not on file  . Transportation needs:    Medical: Not on file    Non-medical: Not on file  Tobacco Use  . Smoking status: Former Smoker    Last attempt to quit: 01/22/2010    Years since quitting: 8.4  . Smokeless tobacco: Never Used  Substance and Sexual Activity  . Alcohol use: No  . Drug use: No  . Sexual activity: Not on file  Lifestyle  . Physical activity:    Days per week: Not on file    Minutes per session: Not on file  . Stress: Not on file  Relationships  . Social connections:    Talks on phone: Not on file    Gets together: Not on file    Attends religious service: Not on file    Active member of club or organization: Not on file    Attends meetings of clubs or organizations: Not on file    Relationship status: Not on file  . Intimate partner violence:    Fear of current or ex partner: Not on file    Emotionally abused: Not on file    Physically abused: Not on file    Forced sexual activity: Not on file  Other Topics Concern  . Not on file  Social History Narrative  . Not on file     Review of Systems  All other systems reviewed and are negative.      Objective:   Physical Exam  Constitutional: She appears well-developed and well-nourished. No distress.  Neck: No JVD present. No thyromegaly present.  Cardiovascular: Normal rate, regular rhythm and normal heart sounds.  No murmur heard. Pulmonary/Chest: Effort normal. No stridor. No respiratory distress. She has no wheezes. She has no rales.  Abdominal: Soft. Bowel sounds are normal. She exhibits no distension and no mass. There is no tenderness. There is no rebound and no guarding. No hernia.  Musculoskeletal: She exhibits no edema.    Lymphadenopathy:    She has no cervical adenopathy.  Skin: She is not diaphoretic.  Vitals reviewed.         Assessment & Plan:  Hospital discharge follow-up - Plan: CBC with Differential/Platelet, COMPLETE METABOLIC PANEL WITH GFR  Spent 25 minutes today with the patient.  I explained to the patient that this is metastatic cancer.  Primary is yet unknown.  There is a mass in the right upper lobe that appears to be the most likely source unless another primary is found on PET scan or MRI.  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  Will defer to oncology's management regarding pulmonology consultation for bronc versus cardiothoracic surgery consultation.  Obtain follow-up lab work today including a CBC and CMP.  I did give the patient a prescription for Xanax to be used sparingly as needed for anxiety as well as a prescription for Norco 5/325 1 p.o. every 6 hours as needed severe chest pain.  At the present time the patient does not feel that she needs them but she would like to have them on hand in case the situation worsens.  She quickly drops to 88% on room air with ambulation and therefore the patient still qualifies for oxygen.  I will consult home health as well as [facility] to see about getting additional help in the home to care for her while her caregiver is at work as the patient is unable to care for herself at home.  We also discussed the possibility of hospice for  additional help in the home however at the present time the patient declines this.  Patient has 2 small stage II decubitus pressure sores on the opposing surfaces of the gluteal cleft.  Each is approximately 6 to 7 mm in diameter and is very superficial skin breakdown.  I gave the patient and her caregiver DuoDERM to apply to the area every 2 to 3 days.  I recommended cushions and all the chairs in which she sits along with frequent position changes.

## 2018-06-25 NOTE — Telephone Encounter (Signed)
Xanax 0.5mg  is on backorder.   Ok to fill 0.25mg ?

## 2018-06-25 NOTE — Telephone Encounter (Signed)
Oncology Nurse Navigator Documentation  Oncology Nurse Navigator Flowsheets 06/25/2018  Navigator Location [facility]  Navigator Encounter Type Telephone/I followed up on scans authorization.  Both have been authorized.  I called central scheduling and obtained appt time and dates.  I called and spoke with [name] and updated him on appt times, dates, location, and pre-procedure instructions.  He verbalized under of all instructions.   Telephone Outgoing Call  Treatment Phase Abnormal Scans  Barriers/Navigation Needs Education;Coordination of Care  Education Other  Interventions Coordination of Care;Education  Coordination of Care Appts  Education Method Verbal;Written  Acuity Level 3  Time Spent with Patient 71

## 2018-06-26 LAB — COMPLETE METABOLIC PANEL WITH GFR
AG Ratio: 1.1 (calc) (ref 1.0–2.5)
ALT: 7 U/L (ref 6–29)
AST: 12 U/L (ref 10–35)
Albumin: 2.6 g/dL — ABNORMAL LOW (ref 3.6–5.1)
Alkaline phosphatase (APISO): 43 U/L (ref 33–130)
BUN/Creatinine Ratio: 27 (calc) — ABNORMAL HIGH (ref 6–22)
BUN: 26 mg/dL — AB (ref 7–25)
CALCIUM: 8.6 mg/dL (ref 8.6–10.4)
CO2: 34 mmol/L — ABNORMAL HIGH (ref 20–32)
CREATININE: 0.95 mg/dL — AB (ref 0.60–0.93)
Chloride: 105 mmol/L (ref 98–110)
GFR, EST AFRICAN AMERICAN: 69 mL/min/{1.73_m2} (ref >=60)
GFR, EST NON AFRICAN AMERICAN: 59 mL/min/{1.73_m2} — AB (ref >=60)
Globulin: 2.3 g/dL (calc) (ref 1.9–3.7)
Glucose, Bld: 93 mg/dL (ref 65–99)
Potassium: 4 mmol/L (ref 3.5–5.3)
Sodium: 146 mmol/L (ref 135–146)
TOTAL PROTEIN: 4.9 g/dL — AB (ref 6.1–8.1)
Total Bilirubin: 0.3 mg/dL (ref 0.2–1.2)

## 2018-06-26 LAB — CBC WITH DIFFERENTIAL/PLATELET
BASOS ABS: 33 {cells}/uL (ref 0–200)
Basophils Relative: 0.2 %
EOS ABS: 215 {cells}/uL (ref 15–500)
Eosinophils Relative: 1.3 %
HEMATOCRIT: 38.5 % (ref 35.0–45.0)
HEMOGLOBIN: 12.5 g/dL (ref 11.7–15.5)
LYMPHS ABS: 1584 {cells}/uL (ref 850–3900)
MCH: 29.5 pg (ref 27.0–33.0)
MCHC: 32.5 g/dL (ref 32.0–36.0)
MCV: 90.8 fL (ref 80.0–100.0)
MPV: 11.9 fL (ref 7.5–12.5)
Monocytes Relative: 5.3 %
NEUTROS ABS: 13794 {cells}/uL — AB (ref 1500–7800)
Neutrophils Relative %: 83.6 %
Platelets: 181 10*3/uL (ref 140–400)
RBC: 4.24 10*6/uL (ref 3.80–5.10)
RDW: 13.7 % (ref 11.0–15.0)
Total Lymphocyte: 9.6 %
WBC mixed population: 875 cells/uL (ref 200–950)
WBC: 16.5 10*3/uL — ABNORMAL HIGH (ref 3.8–10.8)

## 2018-06-28 ENCOUNTER — Telehealth: Payer: Self-pay | Admitting: Family Medicine

## 2018-06-28 ENCOUNTER — Encounter: Payer: Self-pay | Admitting: Pulmonary Disease

## 2018-06-28 ENCOUNTER — Telehealth: Payer: Self-pay

## 2018-06-28 ENCOUNTER — Ambulatory Visit (INDEPENDENT_AMBULATORY_CARE_PROVIDER_SITE_OTHER): Payer: Medicare Other | Admitting: Pulmonary Disease

## 2018-06-28 ENCOUNTER — Ambulatory Visit (INDEPENDENT_AMBULATORY_CARE_PROVIDER_SITE_OTHER)
Admission: RE | Admit: 2018-06-28 | Discharge: 2018-06-28 | Disposition: A | Discharge status: Home / Self Care | Payer: Medicare Other | Source: Ambulatory Visit | Attending: Pulmonary Disease | Admitting: Pulmonary Disease

## 2018-06-28 VITALS — BP 122/72 | HR 94 | Ht 58.5 in | Wt 202.0 lb

## 2018-06-28 DIAGNOSIS — E1122 Type 2 diabetes mellitus with diabetic chronic kidney disease: Secondary | ICD-10-CM

## 2018-06-28 DIAGNOSIS — J9601 Acute respiratory failure with hypoxia: Secondary | ICD-10-CM | POA: Diagnosis not present

## 2018-06-28 DIAGNOSIS — J90 Pleural effusion, not elsewhere classified: Secondary | ICD-10-CM

## 2018-06-28 DIAGNOSIS — R918 Other nonspecific abnormal finding of lung field: Secondary | ICD-10-CM

## 2018-06-28 DIAGNOSIS — C3490 Malignant neoplasm of unspecified part of unspecified bronchus or lung: Secondary | ICD-10-CM

## 2018-06-28 NOTE — Progress Notes (Signed)
@Patient  ID: [name], female    DOB: 13-May-1946, 72 y.o.   MRN: [id number]  No chief complaint on file.   Referring provider: Susy Frizzle, MD  HPI:  72 year old female former smoker followed in our office for initial work-up for EBUS  PMH: A. fib, coronary artery disease status post CABG and PCI, chronic kidney disease stage III, type 2 diabetes, hypertension, hyperlipidemia Smoker/ Smoking History: Former smoker.  52-pack-year smoking history. Maintenance:  None  Pt of: Dr. Valeta Harms  06/28/2018  - Visit   72 year old female patient presenting today for first office visit to be evaluated for EBUS.  Patient was recently seen in the hospital and found to have several masses on CT.  Patient has been seen by oncology.  Patient reports her breathing has been stable.  Patient does report that she has had some issues with orthopnea as well as difficulty breathing when she lays flat.  Patient was recently seen by IR last week for a thoracentesis.  Where 650 mL's were drained from her right side.  Patient has extensive smoking history.  52-pack-year smoking history.  No formal pulmonary function test or breathing test ever done in the past.  Patient was discharged from the hospital on oxygen and her primary care decided to keep her on this.  Patient is on 2 L O2.  Patient presents today with her son [name].     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  Imaging: Dg Chest 1 View  Result Date:  06/24/2018 CLINICAL DATA:  Status post right thoracentesis. EXAM: CHEST  1 VIEW COMPARISON:  Chest x-ray dated June 17, 2018. FINDINGS: Stable cardiomediastinal silhouette status post CABG. Innumerable bilateral pulmonary nodules are unchanged. No significant residual right pleural effusion. Unchanged trace left pleural effusion. No pneumothorax. No acute osseous abnormality. IMPRESSION: 1. No residual right pleural effusion status post thoracentesis. No pneumothorax. 2. Unchanged diffuse pulmonary metastatic disease. Electronically Signed   By: Titus Dubin M.D.   On: 06/24/2018 15:16   Dg Chest 2 View  Result Date: 06/28/2018 CLINICAL DATA:  Status post right-sided thoracentesis on June 24, 2018. EXAM: CHEST - 2 VIEW COMPARISON:  Portable chest x-ray of June 24, 2018 FINDINGS: A small amount of pleural fluid has reaccumulated on the right. There is a trace of pleural fluid on the left as well. Multiple widespread pulmonary parenchymal masses of varying sizes are present. The heart and pulmonary vascularity are normal. The patient has undergone previous CABG. There is calcification in the wall of the aortic arch. The sternal wires are intact. A presumed cardiac loop recorder is located over the left cardiac apex. IMPRESSION: Interval reaccumulation of a small amount of pleural fluid on the right. A smaller left pleural effusion is present but stable. Multiple widespread pulmonary metastatic nodules. Electronically Signed   By: David  Martinique M.D.   On: 06/28/2018 15:07   Ct Chest W Contrast  Result Date: 06/18/2018 CLINICAL DATA:  Changes consistent with pulmonary metastatic disease on recent chest x-ray EXAM: CT CHEST WITH CONTRAST TECHNIQUE: Multidetector CT imaging of the chest was performed during intravenous  contrast administration. CONTRAST:  27mL OMNIPAQUE IOHEXOL 300 MG/ML  SOLN COMPARISON:  Chest x-ray from the previous day FINDINGS: Cardiovascular: Thoracic aorta demonstrates  atherosclerotic calcifications without aneurysmal dilatation or dissection. Changes of coronary bypass grafting are noted. No cardiac enlargement is seen. Coronary calcifications are seen. The pulmonary artery demonstrates a normal branching pattern without central pulmonary emboli. Loop recorder is noted in the left chest wall anteriorly. Mediastinum/Nodes: Thoracic inlet is within normal limits. Scattered lymph nodes are noted throughout the mediastinum. The largest of these lies in the subcarinal region measuring 19 mm in short axis. Hilar adenopathy is noted bilaterally most prominent in the right infrahilar region measuring 18 mm in short axis. This likely represents a conglomeration of multiple smaller nodes. In the right suprahilar region and radiating into the upper lobe on the right there is a ovoid soft tissue mass identified which measures 4.6 by 2.6 cm in greatest dimension. Some narrowing of the right upper lobe bronchus is noted although it appears patent. The esophagus as visualized is within normal limits. Lungs/Pleura: A large right-sided pleural effusion is noted. Multiple, too numerous to count pulmonary nodules are identified consistent with metastatic disease. The largest of these lies in the right upper lobe along the major fissure measuring 3.6 by 2.5 cm in greatest dimension. The large central right suprahilar mass lesion suggests a primary etiology with diffuse pulmonary metastatic disease. Bronchoscopic evaluation with biopsy is recommended. Upper Abdomen: Visualized upper abdomen demonstrates the adrenal glands to be within normal limits. Nonobstructing left renal stone is noted which measures 6 mm in greatest dimension. Renal vascular calcifications are noted as well. The remainder of the upper abdomen is unremarkable. Musculoskeletal: No acute bony abnormality is noted. Degenerative changes of the thoracic spine are seen. IMPRESSION: Constellation of findings consistent with right upper  lobe/suprahilar pulmonary primary neoplasm with diffuse bilateral pulmonary metastatic disease and mediastinal adenopathy. Right-sided pleural effusion is noted as well. Referral for multidisciplinary workup is recommended likely to include PET-CT and tissue sampling. Nonobstructing left renal stone. Aortic Atherosclerosis (ICD10-I70.0). Electronically Signed   By: Inez Catalina M.D.   On: 06/18/2018 09:58   Dg Chest Port 1 View  Result Date: 06/17/2018 CLINICAL DATA:  Dyspnea today EXAM: PORTABLE CHEST 1 VIEW COMPARISON:  06/20/2010 FINDINGS: Multiple bilateral masslike opacities are seen scattered throughout the lungs consistent with metastasis. Heart size and mediastinal contours are stable with aortic atherosclerosis. Median sternotomy sutures are in place. Cardiac monitoring device projects over the left lateral hemithorax. No aggressive osseous lesions. Mild vascular congestion is noted. 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 Mild vascular congestion. Electronically Signed   By: Ashley Royalty M.D.   On: 06/17/2018 22:17   US Thoracentesis Asp Pleural Space W/img Guide  Result Date: 06/24/2018 INDICATION: Patient with CT findings highly suspicious of lung cancer, dyspnea, and right pleural effusion. Request is made for diagnostic and therapeutic right thoracentesis. EXAM: ULTRASOUND GUIDED DIAGNOSTIC AND THERAPEUTIC RIGHT THORACENTESIS MEDICATIONS: 10 mL 1% lidocaine COMPLICATIONS: None immediate. PROCEDURE: An ultrasound guided thoracentesis was thoroughly discussed with the patient and questions answered. The benefits, risks, alternatives and complications were also discussed. The patient understands and wishes to proceed with the procedure. Written consent was obtained. Ultrasound was performed to localize and mark an adequate pocket of fluid in the right chest. The area was then prepped and draped in the normal sterile fashion. 1% Lidocaine was used for local anesthesia.  Under ultrasound guidance a 6 Fr Safe-T-Centesis catheter was introduced. Thoracentesis was performed.  The catheter was removed and a dressing applied. FINDINGS: A total of approximately 650 mL of hazy yellow fluid was removed. Samples were sent to the laboratory as requested by the clinical team. IMPRESSION: Successful ultrasound guided right thoracentesis yielding 650 mL of pleural fluid. Read by: Earley Abide, PA-C Electronically Signed   By: Aletta Edouard M.D.   On: 06/24/2018 15:22    Chart Review:    Specialty Problems      Pulmonary Problems   Obstructive sleep apnea   Acute hypoxemic respiratory failure (HCC)   Pulmonary nodules/lesions, multiple   Pleural effusion on right      Allergies  Allergen Reactions  . Latex   . Neosporin [Neomycin-Polymyxin-Gramicidin]   . Sulfa Antibiotics Nausea And Vomiting    Immunization History  Administered Date(s) Administered  . Influenza, High Dose Seasonal PF 04/27/2017, 05/04/2018  . Influenza,inj,Quad PF,6+ Mos 09/18/2015  . Pneumococcal Conjugate-13 03/18/2016  . Pneumococcal Polysaccharide-23 09/18/2015    Past Medical History:  Diagnosis Date  . Atrial fibrillation (HCC)    paroxysmal  . CAD (coronary artery disease)    X5  . Chronic kidney disease   . Diabetes mellitus   . Gastritis   . GI bleeding   . Hiatal hernia   . Hyperglycemia   . Hyperlipidemia   . Hypertension   . Obesity   . Obstructive sleep apnea   . Osteopenia   . Peripheral artery disease (HCC)    lower extremities  . Stroke ([place])   . Systolic murmur   . Ulcer   . Vitamin D deficiency     Tobacco History: Social History   Tobacco Use  Smoking Status Former Smoker  . Packs/day: 1.00  . Years: 52.00  . Pack years: 52.00  . Start date: 08/04/1957  . Last attempt to quit: 01/22/2010  . Years since quitting: 8.4  Smokeless Tobacco Never Used   Counseling given: Yes  Patient to continue to not smoke.  Outpatient Encounter Medications  as of 06/28/2018  Medication Sig  . acetaminophen (TYLENOL) 325 MG tablet Take 650 mg by mouth every 6 (six) hours as needed.  [name] albuterol (PROVENTIL HFA;VENTOLIN HFA) 108 (90 Base) MCG/ACT inhaler Inhale 2 puffs into the lungs every 6 (six) hours as needed for wheezing or shortness of breath.  . ALPRAZolam (XANAX) 0.25 MG tablet TAKE 2 TABLETS (0.5MG ) BY MOUTH 3 TIMES A DAY AS NEEDED FOR ANXIETY  . amLODipine (NORVASC) 10 MG tablet Take 10 mg by mouth daily.    [name] aspirin 81 MG tablet Take 81 mg by mouth daily.   . B-D ULTRAFINE III SHORT PEN 31G X 8 MM MISC USE AS DIRECTED  . Calcium Carbonate-Vit D-Min (CALCIUM 1200 PO) Take 1 tablet by mouth.   . clopidogrel (PLAVIX) 75 MG tablet Take 1 tablet (75 mg total) by mouth daily.  . [employer] VITAMIN D 2000 units CAPS TAKE ONE CAPSULE BY MOUTH DAILY  . furosemide (LASIX) 40 MG tablet Take 1 tablet (40 mg total) by mouth daily.  . hydrALAZINE (APRESOLINE) 25 MG tablet Take 1 tablet (25 mg total) by mouth daily as needed. (Patient taking differently: Take 25 mg by mouth daily. )  . HYDROcodone-acetaminophen (NORCO) 5-325 MG tablet Take 1 tablet by mouth every 6 (six) hours as needed for moderate pain.  [name] LEVEMIR FLEXTOUCH 100 UNIT/ML Pen INJECT 80 UNITS INTO THE SKIN 2 TIMES DAILY. (Patient taking differently: Inject 50 Units into the skin 2 (two) times daily. )  .  lisinopril (PRINIVIL,ZESTRIL) 20 MG tablet TAKE 1 TABLET (20 MG TOTAL) BY MOUTH DAILY.  [name] metoCLOPramide (REGLAN) 5 MG tablet TAKE 1 TABLET BY MOUTH 3 TIMES A DAY BEFORE MEALS AND 1 AT BEDTIME (Patient taking differently: Take 5 mg by mouth See admin instructions. TAKE 1 TABLET BY MOUTH 3 TIMES A DAY BEFORE MEALS AND 1 AT BEDTIME)  . niacin (NIASPAN) 750 MG CR tablet TAKE 1 TABLET BY MOUTH EVERY DAY AT BEDTIME (Patient taking differently: Take 750 mg by mouth every morning. )  . nitroGLYCERIN (NITROSTAT) 0.4 MG SL tablet Place 0.4 mg under the tongue every 5 (five) minutes as needed.  . Omega-3  Fatty Acids (FISH OIL) 1200 MG CPDR Take 2 tablets by mouth at bedtime.  . pantoprazole (PROTONIX) 40 MG tablet TAKE 1 TABLET BY MOUTH EVERY DAY  . phenylephrine (SUDAFED PE) 10 MG TABS tablet Take 10 mg by mouth 4 (four) times daily.  . promethazine (PHENERGAN) 25 MG tablet Take 1 tablet (25 mg total) by mouth every 8 (eight) hours as needed for nausea or vomiting.  . rosuvastatin (CRESTOR) 10 MG tablet Take 1 tablet (10 mg total) by mouth daily.  . silver sulfADIAZINE (SILVADENE) 1 % cream Apply 1 application topically daily.  . TRADJENTA 5 MG TABS tablet TAKE 1 TABLET BY MOUTH EVERY DAY (Patient taking differently: Take 5 mg by mouth daily. )   No facility-administered encounter medications on file as of 06/28/2018.      Review of Systems  Review of Systems  Constitutional: Positive for fatigue. Negative for chills, fever and unexpected weight change.  HENT: Negative for congestion, ear pain and sinus pressure.   Respiratory: Positive for shortness of breath. Negative for cough, chest tightness and wheezing.   Cardiovascular: Negative for chest pain and palpitations.       +orthopnea  Gastrointestinal: Negative for diarrhea, nausea and vomiting.  Musculoskeletal: Negative for arthralgias.  Skin: Negative for color change.  Allergic/Immunologic: Negative for environmental allergies and food allergies.  Neurological: Negative for dizziness, light-headedness and headaches.  Psychiatric/Behavioral: Negative for dysphoric mood. The patient is not nervous/anxious.   All other systems reviewed and are negative.    Physical Exam  BP 122/72 (BP Location: Right Arm, Cuff Size: Normal)   Pulse 94   Ht 4' 10.5" (1.486 m)   Wt 202 lb (91.6 kg)   SpO2 95%   BMI 41.50 kg/m   Wt Readings from Last 5 Encounters:  06/28/18 202 lb (91.6 kg)  06/25/18 197 lb (89.4 kg)  06/23/18 198 lb 3.2 oz (89.9 kg)  06/18/18 190 lb 14.7 oz (86.6 kg)  04/02/18 198 lb (89.8 kg)   2 L via nasal  cannula  Physical Exam  Constitutional: She is oriented to person, place, and time and well-developed, well-nourished, and in no distress. She appears unhealthy. No distress.  +obese elderly female, sitting in motorized wheelchair  HENT:  Head: Normocephalic and atraumatic.  Right Ear: Hearing, tympanic membrane, external ear and ear canal normal.  Left Ear: Hearing, tympanic membrane, external ear and ear canal normal.  Nose: Mucosal edema present. Right sinus exhibits no maxillary sinus tenderness and no frontal sinus tenderness. Left sinus exhibits no maxillary sinus tenderness and no frontal sinus tenderness.  Mouth/Throat: Uvula is midline and oropharynx is clear and moist. No oropharyngeal exudate.  Postnasal drip  Eyes: Pupils are equal, round, and reactive to light.  Neck: Normal range of motion. Neck supple. No JVD present.  Cardiovascular: Normal rate, regular rhythm  and normal heart sounds.  Pulmonary/Chest: Effort normal and breath sounds normal. No accessory muscle usage. No respiratory distress. She has no decreased breath sounds. She has no wheezes. She has no rhonchi.  +right base slight dullness  Abdominal: Soft. Bowel sounds are normal. There is no tenderness.  Musculoskeletal: Normal range of motion. She exhibits no edema.  Lymphadenopathy:    She has no cervical adenopathy.  Neurological: She is alert and oriented to person, place, and time. Gait normal.  Skin: Skin is warm and dry. She is not diaphoretic. No erythema.  Psychiatric: Mood, memory, affect and judgment normal.  Nursing note and vitals reviewed.     Lab Results:  CBC    Component Value Date/Time   WBC 16.5 (H) 06/25/2018 1542   RBC 4.24 06/25/2018 1542   HGB 12.5 06/25/2018 1542   HGB 12.6 06/23/2018 1400   HCT 38.5 06/25/2018 1542   PLT 181 06/25/2018 1542   PLT 226 06/23/2018 1400   MCV 90.8 06/25/2018 1542   MCH 29.5 06/25/2018 1542   MCHC 32.5 06/25/2018 1542   RDW 13.7 06/25/2018 1542    LYMPHSABS 1,584 06/25/2018 1542   MONOABS 0.6 06/23/2018 1400   EOSABS 215 06/25/2018 1542   BASOSABS 33 06/25/2018 1542    BMET    Component Value Date/Time   NA 146 06/25/2018 1542   K 4.0 06/25/2018 1542   CL 105 06/25/2018 1542   CO2 34 (H) 06/25/2018 1542   GLUCOSE 93 06/25/2018 1542   BUN 26 (H) 06/25/2018 1542   CREATININE 0.95 (H) 06/25/2018 1542   CALCIUM 8.6 06/25/2018 1542   GFRNONAA 59 (L) 06/25/2018 1542   GFRAA 69 06/25/2018 1542    BNP    Component Value Date/Time   BNP 61.2 06/17/2018 2114    ProBNP No results found for: PROBNP    Assessment & Plan:   Pleasant 72 year old female patient presenting today for office visit.  Patient be prepared for EBUS with Dr. Valeta Harms next week on 07/07/18 in the afternoon.  I have discussed the case with Dr. Valeta Harms he agrees with the current plan of care.  We will have patient hold Plavix for the next 7 days.  We will have patient follow-up with primary care regarding her Levemir dose.  We will have patient eat nothing by mouth after 12 AM the night before the procedure.  I do have concerns of the patient's pleural effusion has reaccumulated.  Will get chest x-ray today to further evaluate.  Could consider pulmonary function tests after oncology work-up is completed.  Pulmonary nodules/lesions, multiple  We will set you up for an outpatient bronchoscopy -EBUS >>> Plan for this to tentatively be next Wednesday - 07/07/18 >>> We will contact you with further details and specifics >>> This will be completed at [facility] long hospital  We will have you hold your Plavix 7 days prior to that procedure >>> Start holding her Plavix and daily aspirin on 06/30/18  We will also have you contact primary care discussing her Levemir dose >>>Let them know that you are being scheduled for a bronchoscopy on 07/07/2018 in any direction on how to handle your Levemir dose at that time  Follow-up with an outpatient appointment with Dr. Valeta Harms  or Wyn Quaker NP 1-2 week after bronchoscopy is completed  Type 2 diabetes mellitus with chronic kidney disease ([place])  We will also have you contact primary care discussing her Levemir dose >>>Let them know that you are being scheduled for a  bronchoscopy on 07/07/2018 in any direction on how to handle your Levemir dose at that time   Acute hypoxemic respiratory failure ([place]) Chest Xray today  >>>[facility]   Continue oxygen therapy as prescribed >>>Ensure oxygenation remains greater than 90%   Pleural effusion on right Chest Xray today  >>>[facility]    This appointment was 32 minutes along with over 50% of that time direct face-to-face patient care, assessment, plan of care follow-up.   Lauraine Rinne, NP 06/28/2018

## 2018-06-28 NOTE — Progress Notes (Signed)
Chest x-ray today shows small amount of pleural effusion fluid has reaccumulated on the right.  I discussed this case with Dr. Valeta Harms and him and I both agree that unless symptoms worsen such as needing increased oxygen, severe shortness of breath, or worsening respiratory status that we do not need to do a thoracentesis at this time.  We will proceed forward with a bronchoscopy next week as discussed in office visit today.  If symptoms worsen then please present to an emergency room for further evaluation.  Wyn Quaker FNP

## 2018-06-28 NOTE — Assessment & Plan Note (Signed)
  We will set you up for an outpatient bronchoscopy -EBUS >>> Plan for this to tentatively be next Wednesday - 07/07/18 >>> We will contact you with further details and specifics >>> This will be completed at [facility] long hospital  We will have you hold your Plavix 7 days prior to that procedure >>> Start holding her Plavix and daily aspirin on 06/30/18  We will also have you contact primary care discussing her Levemir dose >>>Let them know that you are being scheduled for a bronchoscopy on 07/07/2018 in any direction on how to handle your Levemir dose at that time  Follow-up with an outpatient appointment with Dr. Valeta Harms or Wyn Quaker NP 1-2 week after bronchoscopy is completed

## 2018-06-28 NOTE — Assessment & Plan Note (Addendum)
  We will also have you contact primary care discussing her Levemir dose >>>Let them know that you are being scheduled for a bronchoscopy on 07/07/2018 in any direction on how to handle your Levemir dose at that time  Also discussed with primary care your Tradjenta dose

## 2018-06-28 NOTE — Addendum Note (Signed)
Addended by: Vic Blackbird F on: 06/28/2018 05:54 PM   Modules accepted: Orders

## 2018-06-28 NOTE — Patient Instructions (Addendum)
Chest Xray today  >>>[facility]   Continue oxygen therapy as prescribed >>>Ensure oxygenation remains greater than 90%  We will set you up for an outpatient bronchoscopy -EBUS >>> Plan for this to tentatively be next Wednesday - 07/07/18 >>> We will contact you with further details and specifics >>> This will be completed at [facility]  We will have you hold your Plavix 7 days prior to that procedure >>> Start holding her Plavix and daily aspirin on 07/01/18  We will also have you contact primary care discussing her Levemir dose >>>Let them know that you are being scheduled for a bronchoscopy on 07/07/2018 in any direction on how to handle your Levemir dose at that time  Follow-up with an outpatient appointment with Dr. Valeta Harms or Wyn Quaker NP 1-2 week after bronchoscopy is completed  It is flu season:   >>>Remember to be washing your hands regularly, using hand sanitizer, be careful to use around herself with has contact with people who are sick will increase her chances of getting sick yourself. >>> Best ways to protect herself from the flu: Receive the yearly flu vaccine, practice good hand hygiene washing with soap and also using hand sanitizer when available, eat a nutritious meals, get adequate rest, hydrate appropriately   Please contact the office if your symptoms worsen or you have concerns that you are not improving.   Thank you for choosing [facility] for your healthcare, and for allowing [place] to partner with you on your healthcare journey. I am thankful to be able to provide care to you today.   Wyn Quaker FNP-C

## 2018-06-28 NOTE — H&P (View-Only) (Signed)
@Patient  ID: [name], female    DOB: [birth year], 72 y.o.   MRN: [id number]  No chief complaint on file.   Referring provider: Susy Frizzle, MD  HPI:  72 year old female former smoker followed in our office for initial work-up for EBUS  PMH: A. fib, coronary artery disease status post CABG and PCI, chronic kidney disease stage III, type 2 diabetes, hypertension, hyperlipidemia Smoker/ Smoking History: Former smoker.  52-pack-year smoking history. Maintenance:  None  Pt of: Dr. Valeta Harms  06/28/2018  - Visit   72 year old female patient presenting today for first office visit to be evaluated for EBUS.  Patient was recently seen in the hospital and found to have several masses on CT.  Patient has been seen by oncology.  Patient reports her breathing has been stable.  Patient does report that she has had some issues with orthopnea as well as difficulty breathing when she lays flat.  Patient was recently seen by IR last week for a thoracentesis.  Where 650 mL's were drained from her right side.  Patient has extensive smoking history.  52-pack-year smoking history.  No formal pulmonary function test or breathing test ever done in the past.  Patient was discharged from the hospital on oxygen and her primary care decided to keep her on this.  Patient is on 2 L O2.  Patient presents today with her son [name].     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  Imaging: Dg Chest 1 View  Result Date:  06/24/2018 CLINICAL DATA:  Status post right thoracentesis. EXAM: CHEST  1 VIEW COMPARISON:  Chest x-ray dated June 17, 2018. FINDINGS: Stable cardiomediastinal silhouette status post CABG. Innumerable bilateral pulmonary nodules are unchanged. No significant residual right pleural effusion. Unchanged trace left pleural effusion. No pneumothorax. No acute osseous abnormality. IMPRESSION: 1. No residual right pleural effusion status post thoracentesis. No pneumothorax. 2. Unchanged diffuse pulmonary metastatic disease. Electronically Signed   By: Titus Dubin M.D.   On: 06/24/2018 15:16   Dg Chest 2 View  Result Date: 06/28/2018 CLINICAL DATA:  Status post right-sided thoracentesis on June 24, 2018. EXAM: CHEST - 2 VIEW COMPARISON:  Portable chest x-ray of June 24, 2018 FINDINGS: A small amount of pleural fluid has reaccumulated on the right. There is a trace of pleural fluid on the left as well. Multiple widespread pulmonary parenchymal masses of varying sizes are present. The heart and pulmonary vascularity are normal. The patient has undergone previous CABG. There is calcification in the wall of the aortic arch. The sternal wires are intact. A presumed cardiac loop recorder is located over the left cardiac apex. IMPRESSION: Interval reaccumulation of a small amount of pleural fluid on the right. A smaller left pleural effusion is present but stable. Multiple widespread pulmonary metastatic nodules. Electronically Signed   By: David  Martinique M.D.   On: 06/28/2018 15:07   Ct Chest W Contrast  Result Date: 06/18/2018 CLINICAL DATA:  Changes consistent with pulmonary metastatic disease on recent chest x-ray EXAM: CT CHEST WITH CONTRAST TECHNIQUE: Multidetector CT imaging of the chest was performed during intravenous  contrast administration. CONTRAST:  32mL OMNIPAQUE IOHEXOL 300 MG/ML  SOLN COMPARISON:  Chest x-ray from the previous day FINDINGS: Cardiovascular: Thoracic aorta demonstrates  atherosclerotic calcifications without aneurysmal dilatation or dissection. Changes of coronary bypass grafting are noted. No cardiac enlargement is seen. Coronary calcifications are seen. The pulmonary artery demonstrates a normal branching pattern without central pulmonary emboli. Loop recorder is noted in the left chest wall anteriorly. Mediastinum/Nodes: Thoracic inlet is within normal limits. Scattered lymph nodes are noted throughout the mediastinum. The largest of these lies in the subcarinal region measuring 19 mm in short axis. Hilar adenopathy is noted bilaterally most prominent in the right infrahilar region measuring 18 mm in short axis. This likely represents a conglomeration of multiple smaller nodes. In the right suprahilar region and radiating into the upper lobe on the right there is a ovoid soft tissue mass identified which measures 4.6 by 2.6 cm in greatest dimension. Some narrowing of the right upper lobe bronchus is noted although it appears patent. The esophagus as visualized is within normal limits. Lungs/Pleura: A large right-sided pleural effusion is noted. Multiple, too numerous to count pulmonary nodules are identified consistent with metastatic disease. The largest of these lies in the right upper lobe along the major fissure measuring 3.6 by 2.5 cm in greatest dimension. The large central right suprahilar mass lesion suggests a primary etiology with diffuse pulmonary metastatic disease. Bronchoscopic evaluation with biopsy is recommended. Upper Abdomen: Visualized upper abdomen demonstrates the adrenal glands to be within normal limits. Nonobstructing left renal stone is noted which measures 6 mm in greatest dimension. Renal vascular calcifications are noted as well. The remainder of the upper abdomen is unremarkable. Musculoskeletal: No acute bony abnormality is noted. Degenerative changes of the thoracic spine are seen. IMPRESSION: Constellation of findings consistent with right upper  lobe/suprahilar pulmonary primary neoplasm with diffuse bilateral pulmonary metastatic disease and mediastinal adenopathy. Right-sided pleural effusion is noted as well. Referral for multidisciplinary workup is recommended likely to include PET-CT and tissue sampling. Nonobstructing left renal stone. Aortic Atherosclerosis (ICD10-I70.0). Electronically Signed   By: Inez Catalina M.D.   On: 06/18/2018 09:58   Dg Chest Port 1 View  Result Date: 06/17/2018 CLINICAL DATA:  Dyspnea today EXAM: PORTABLE CHEST 1 VIEW COMPARISON:  06/20/2010 FINDINGS: Multiple bilateral masslike opacities are seen scattered throughout the lungs consistent with metastasis. Heart size and mediastinal contours are stable with aortic atherosclerosis. Median sternotomy sutures are in place. Cardiac monitoring device projects over the left lateral hemithorax. No aggressive osseous lesions. Mild vascular congestion is noted. 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 Mild vascular congestion. Electronically Signed   By: Ashley Royalty M.D.   On: 06/17/2018 22:17   US Thoracentesis Asp Pleural Space W/img Guide  Result Date: 06/24/2018 INDICATION: Patient with CT findings highly suspicious of lung cancer, dyspnea, and right pleural effusion. Request is made for diagnostic and therapeutic right thoracentesis. EXAM: ULTRASOUND GUIDED DIAGNOSTIC AND THERAPEUTIC RIGHT THORACENTESIS MEDICATIONS: 10 mL 1% lidocaine COMPLICATIONS: None immediate. PROCEDURE: An ultrasound guided thoracentesis was thoroughly discussed with the patient and questions answered. The benefits, risks, alternatives and complications were also discussed. The patient understands and wishes to proceed with the procedure. Written consent was obtained. Ultrasound was performed to localize and mark an adequate pocket of fluid in the right chest. The area was then prepped and draped in the normal sterile fashion. 1% Lidocaine was used for local anesthesia.  Under ultrasound guidance a 6 Fr Safe-T-Centesis catheter was introduced. Thoracentesis was performed.  The catheter was removed and a dressing applied. FINDINGS: A total of approximately 650 mL of hazy yellow fluid was removed. Samples were sent to the laboratory as requested by the clinical team. IMPRESSION: Successful ultrasound guided right thoracentesis yielding 650 mL of pleural fluid. Read by: Earley Abide, PA-C Electronically Signed   By: Aletta Edouard M.D.   On: 06/24/2018 15:22    Chart Review:    Specialty Problems      Pulmonary Problems   Obstructive sleep apnea   Acute hypoxemic respiratory failure (HCC)   Pulmonary nodules/lesions, multiple   Pleural effusion on right      Allergies  Allergen Reactions  . Latex   . Neosporin [Neomycin-Polymyxin-Gramicidin]   . Sulfa Antibiotics Nausea And Vomiting    Immunization History  Administered Date(s) Administered  . Influenza, High Dose Seasonal PF 04/27/2017, 05/04/2018  . Influenza,inj,Quad PF,6+ Mos 09/18/2015  . Pneumococcal Conjugate-13 03/18/2016  . Pneumococcal Polysaccharide-23 09/18/2015    Past Medical History:  Diagnosis Date  . Atrial fibrillation (HCC)    paroxysmal  . CAD (coronary artery disease)    X5  . Chronic kidney disease   . Diabetes mellitus   . Gastritis   . GI bleeding   . Hiatal hernia   . Hyperglycemia   . Hyperlipidemia   . Hypertension   . Obesity   . Obstructive sleep apnea   . Osteopenia   . Peripheral artery disease (HCC)    lower extremities  . Stroke ([place])   . Systolic murmur   . Ulcer   . Vitamin D deficiency     Tobacco History: Social History   Tobacco Use  Smoking Status Former Smoker  . Packs/day: 1.00  . Years: 52.00  . Pack years: 52.00  . Start date: 08/04/1957  . Last attempt to quit: 01/22/2010  . Years since quitting: 8.4  Smokeless Tobacco Never Used   Counseling given: Yes  Patient to continue to not smoke.  Outpatient Encounter Medications  as of 06/28/2018  Medication Sig  . acetaminophen (TYLENOL) 325 MG tablet Take 650 mg by mouth every 6 (six) hours as needed.  [name] albuterol (PROVENTIL HFA;VENTOLIN HFA) 108 (90 Base) MCG/ACT inhaler Inhale 2 puffs into the lungs every 6 (six) hours as needed for wheezing or shortness of breath.  . ALPRAZolam (XANAX) 0.25 MG tablet TAKE 2 TABLETS (0.5MG ) BY MOUTH 3 TIMES A DAY AS NEEDED FOR ANXIETY  . amLODipine (NORVASC) 10 MG tablet Take 10 mg by mouth daily.    [name] aspirin 81 MG tablet Take 81 mg by mouth daily.   . B-D ULTRAFINE III SHORT PEN 31G X 8 MM MISC USE AS DIRECTED  . Calcium Carbonate-Vit D-Min (CALCIUM 1200 PO) Take 1 tablet by mouth.   . clopidogrel (PLAVIX) 75 MG tablet Take 1 tablet (75 mg total) by mouth daily.  . [facility] VITAMIN D 2000 units CAPS TAKE ONE CAPSULE BY MOUTH DAILY  . furosemide (LASIX) 40 MG tablet Take 1 tablet (40 mg total) by mouth daily.  . hydrALAZINE (APRESOLINE) 25 MG tablet Take 1 tablet (25 mg total) by mouth daily as needed. (Patient taking differently: Take 25 mg by mouth daily. )  . HYDROcodone-acetaminophen (NORCO) 5-325 MG tablet Take 1 tablet by mouth every 6 (six) hours as needed for moderate pain.  [name] LEVEMIR FLEXTOUCH 100 UNIT/ML Pen INJECT 80 UNITS INTO THE SKIN 2 TIMES DAILY. (Patient taking differently: Inject 50 Units into the skin 2 (two) times daily. )  .  lisinopril (PRINIVIL,ZESTRIL) 20 MG tablet TAKE 1 TABLET (20 MG TOTAL) BY MOUTH DAILY.  [name] metoCLOPramide (REGLAN) 5 MG tablet TAKE 1 TABLET BY MOUTH 3 TIMES A DAY BEFORE MEALS AND 1 AT BEDTIME (Patient taking differently: Take 5 mg by mouth See admin instructions. TAKE 1 TABLET BY MOUTH 3 TIMES A DAY BEFORE MEALS AND 1 AT BEDTIME)  . niacin (NIASPAN) 750 MG CR tablet TAKE 1 TABLET BY MOUTH EVERY DAY AT BEDTIME (Patient taking differently: Take 750 mg by mouth every morning. )  . nitroGLYCERIN (NITROSTAT) 0.4 MG SL tablet Place 0.4 mg under the tongue every 5 (five) minutes as needed.  . Omega-3  Fatty Acids (FISH OIL) 1200 MG CPDR Take 2 tablets by mouth at bedtime.  . pantoprazole (PROTONIX) 40 MG tablet TAKE 1 TABLET BY MOUTH EVERY DAY  . phenylephrine (SUDAFED PE) 10 MG TABS tablet Take 10 mg by mouth 4 (four) times daily.  . promethazine (PHENERGAN) 25 MG tablet Take 1 tablet (25 mg total) by mouth every 8 (eight) hours as needed for nausea or vomiting.  . rosuvastatin (CRESTOR) 10 MG tablet Take 1 tablet (10 mg total) by mouth daily.  . silver sulfADIAZINE (SILVADENE) 1 % cream Apply 1 application topically daily.  . TRADJENTA 5 MG TABS tablet TAKE 1 TABLET BY MOUTH EVERY DAY (Patient taking differently: Take 5 mg by mouth daily. )   No facility-administered encounter medications on file as of 06/28/2018.      Review of Systems  Review of Systems  Constitutional: Positive for fatigue. Negative for chills, fever and unexpected weight change.  HENT: Negative for congestion, ear pain and sinus pressure.   Respiratory: Positive for shortness of breath. Negative for cough, chest tightness and wheezing.   Cardiovascular: Negative for chest pain and palpitations.       +orthopnea  Gastrointestinal: Negative for diarrhea, nausea and vomiting.  Musculoskeletal: Negative for arthralgias.  Skin: Negative for color change.  Allergic/Immunologic: Negative for environmental allergies and food allergies.  Neurological: Negative for dizziness, light-headedness and headaches.  Psychiatric/Behavioral: Negative for dysphoric mood. The patient is not nervous/anxious.   All other systems reviewed and are negative.    Physical Exam  BP 122/72 (BP Location: Right Arm, Cuff Size: Normal)   Pulse 94   Ht 4' 10.5" (1.486 m)   Wt 202 lb (91.6 kg)   SpO2 95%   BMI 41.50 kg/m   Wt Readings from Last 5 Encounters:  06/28/18 202 lb (91.6 kg)  06/25/18 197 lb (89.4 kg)  06/23/18 198 lb 3.2 oz (89.9 kg)  06/18/18 190 lb 14.7 oz (86.6 kg)  04/02/18 198 lb (89.8 kg)   2 L via nasal  cannula  Physical Exam  Constitutional: She is oriented to person, place, and time and well-developed, well-nourished, and in no distress. She appears unhealthy. No distress.  +obese elderly female, sitting in motorized wheelchair  HENT:  Head: Normocephalic and atraumatic.  Right Ear: Hearing, tympanic membrane, external ear and ear canal normal.  Left Ear: Hearing, tympanic membrane, external ear and ear canal normal.  Nose: Mucosal edema present. Right sinus exhibits no maxillary sinus tenderness and no frontal sinus tenderness. Left sinus exhibits no maxillary sinus tenderness and no frontal sinus tenderness.  Mouth/Throat: Uvula is midline and oropharynx is clear and moist. No oropharyngeal exudate.  Postnasal drip  Eyes: Pupils are equal, round, and reactive to light.  Neck: Normal range of motion. Neck supple. No JVD present.  Cardiovascular: Normal rate, regular rhythm  and normal heart sounds.  Pulmonary/Chest: Effort normal and breath sounds normal. No accessory muscle usage. No respiratory distress. She has no decreased breath sounds. She has no wheezes. She has no rhonchi.  +right base slight dullness  Abdominal: Soft. Bowel sounds are normal. There is no tenderness.  Musculoskeletal: Normal range of motion. She exhibits no edema.  Lymphadenopathy:    She has no cervical adenopathy.  Neurological: She is alert and oriented to person, place, and time. Gait normal.  Skin: Skin is warm and dry. She is not diaphoretic. No erythema.  Psychiatric: Mood, memory, affect and judgment normal.  Nursing note and vitals reviewed.     Lab Results:  CBC    Component Value Date/Time   WBC 16.5 (H) 06/25/2018 1542   RBC 4.24 06/25/2018 1542   HGB 12.5 06/25/2018 1542   HGB 12.6 06/23/2018 1400   HCT 38.5 06/25/2018 1542   PLT 181 06/25/2018 1542   PLT 226 06/23/2018 1400   MCV 90.8 06/25/2018 1542   MCH 29.5 06/25/2018 1542   MCHC 32.5 06/25/2018 1542   RDW 13.7 06/25/2018 1542    LYMPHSABS 1,584 06/25/2018 1542   MONOABS 0.6 06/23/2018 1400   EOSABS 215 06/25/2018 1542   BASOSABS 33 06/25/2018 1542    BMET    Component Value Date/Time   NA 146 06/25/2018 1542   K 4.0 06/25/2018 1542   CL 105 06/25/2018 1542   CO2 34 (H) 06/25/2018 1542   GLUCOSE 93 06/25/2018 1542   BUN 26 (H) 06/25/2018 1542   CREATININE 0.95 (H) 06/25/2018 1542   CALCIUM 8.6 06/25/2018 1542   GFRNONAA 59 (L) 06/25/2018 1542   GFRAA 69 06/25/2018 1542    BNP    Component Value Date/Time   BNP 61.2 06/17/2018 2114    ProBNP No results found for: PROBNP    Assessment & Plan:   Pleasant 72 year old female patient presenting today for office visit.  Patient be prepared for EBUS with Dr. Valeta Harms next week on 07/07/18 in the afternoon.  I have discussed the case with Dr. Valeta Harms he agrees with the current plan of care.  We will have patient hold Plavix for the next 7 days.  We will have patient follow-up with primary care regarding her Levemir dose.  We will have patient eat nothing by mouth after 12 AM the night before the procedure.  I do have concerns of the patient's pleural effusion has reaccumulated.  Will get chest x-ray today to further evaluate.  Could consider pulmonary function tests after oncology work-up is completed.  Pulmonary nodules/lesions, multiple  We will set you up for an outpatient bronchoscopy -EBUS >>> Plan for this to tentatively be next Wednesday - 07/07/18 >>> We will contact you with further details and specifics >>> This will be completed at [facility]  We will have you hold your Plavix 7 days prior to that procedure >>> Start holding her Plavix and daily aspirin on 06/30/18  We will also have you contact primary care discussing her Levemir dose >>>Let them know that you are being scheduled for a bronchoscopy on 07/07/2018 in any direction on how to handle your Levemir dose at that time  Follow-up with an outpatient appointment with Dr. Valeta Harms  or Wyn Quaker NP 1-2 week after bronchoscopy is completed  Type 2 diabetes mellitus with chronic kidney disease ([place])  We will also have you contact primary care discussing her Levemir dose >>>Let them know that you are being scheduled for a  bronchoscopy on 07/07/2018 in any direction on how to handle your Levemir dose at that time   Acute hypoxemic respiratory failure ([place]) Chest Xray today  >>>[facility]   Continue oxygen therapy as prescribed >>>Ensure oxygenation remains greater than 90%   Pleural effusion on right Chest Xray today  >>>[facility]    This appointment was 32 minutes along with over 50% of that time direct face-to-face patient care, assessment, plan of care follow-up.   Lauraine Rinne, NP 06/28/2018

## 2018-06-28 NOTE — Telephone Encounter (Signed)
[facility]  [facility] in [facility] and will fill rx

## 2018-06-28 NOTE — Assessment & Plan Note (Signed)
Chest Xray today  >>>[facility]

## 2018-06-28 NOTE — Telephone Encounter (Signed)
Spoke to [name] and her caretaker and discussed hospice with them as this would be the best thing for her as they have access to resources others do not. And we can always just let them do an assessment and if their services are not needed then we can end the referral. [name] and [name] agree and referral placed.

## 2018-06-28 NOTE — Assessment & Plan Note (Signed)
Chest Xray today  >>>[facility]   Continue oxygen therapy as prescribed >>>Ensure oxygenation remains greater than 90%

## 2018-06-28 NOTE — Telephone Encounter (Signed)
Agree with above referral

## 2018-06-29 ENCOUNTER — Telehealth: Payer: Self-pay | Admitting: *Deleted

## 2018-06-29 ENCOUNTER — Telehealth: Payer: Self-pay | Admitting: Pulmonary Disease

## 2018-06-29 NOTE — Telephone Encounter (Signed)
Patient has been contacted by Dr. Alta Corning office. Nothing further is needed at this time.

## 2018-06-29 NOTE — Telephone Encounter (Signed)
Called and spoke with Family friend [name], made aware we would call them once we had a date and time specified. Voiced understand. Will route to [place] for F/U.

## 2018-06-29 NOTE — Progress Notes (Signed)
PCCM:  Agree. Hold plavix starting now. Discussed with Wyn Quaker, NP. Will schedule for Dec. 4th. [facility]. Booking Number: [phone].   Garner Nash, DO [facility] 06/29/2018 8:07 AM

## 2018-06-29 NOTE — Progress Notes (Signed)
Also when you contact the patient please let her know to stop her Plavix starting now.  If she is took it today that is okay but do not take any more doses until the procedure.  Remind patient to contact primary care regarding her blood sugar management for her Tradjenta dose as well as her Levemir dosing.   Also we are waiting to hear back from oncology.  They may call you to reschedule the Dr. Earlie Server appointment on Wednesday as well as your PET scan.  They may proceed forward with that testing after our procedure.  Be aware they may contact you.  Wyn Quaker FNP

## 2018-06-29 NOTE — Telephone Encounter (Signed)
Oncology Nurse Navigator Documentation  Oncology Nurse Navigator Flowsheets 06/29/2018  Navigator Location [facility]  Navigator Encounter Type Telephone/I received clarification on appts for Ms. [name] and I called and spoke with her friend, [name].  He verbalized understanding of all appts time, place and location.    Telephone Outgoing Call  Treatment Phase Abnormal Scans  Barriers/Navigation Needs Education;Coordination of Care  Education Other  Interventions Coordination of Care;Education  Coordination of Care Appts;Other  Education Method Verbal  Acuity Level 2  Time Spent with Patient 30

## 2018-06-30 ENCOUNTER — Other Ambulatory Visit: Payer: Self-pay | Admitting: Family Medicine

## 2018-06-30 ENCOUNTER — Other Ambulatory Visit (HOSPITAL_COMMUNITY): Payer: Medicare Other

## 2018-06-30 ENCOUNTER — Telehealth: Payer: Self-pay | Admitting: Family Medicine

## 2018-06-30 MED ORDER — PROMETHAZINE HCL 25 MG PO TABS: 25.0000 mg | ORAL_TABLET | Freq: Three times a day (TID) | ORAL | 0 refills | Status: AC | PRN

## 2018-06-30 MED ORDER — ALPRAZOLAM 1 MG PO TABS: 0.5000 mg | ORAL_TABLET | Freq: Three times a day (TID) | ORAL | 0 refills | Status: AC | PRN

## 2018-06-30 NOTE — Telephone Encounter (Signed)
Please resend for 1mg  Xanax - [facility]  & .25mg .  RX set up for 1 mg

## 2018-06-30 NOTE — Telephone Encounter (Signed)
Dr. Konrad Dolores from Hospice called and pt was evaluated for Hospice however patient is to see oncology and would like to wait until these appointments are done before doing hospice but would like palliative care. He just wanted [place] to know. I also spoke to [place] about this and he said the same thing that they would like to wait until after the procedures and consultation before entering hospice because once in Hospice no further treatment is applied just end of life measures.

## 2018-07-02 ENCOUNTER — Emergency Department (HOSPITAL_COMMUNITY): Payer: Medicare Other

## 2018-07-02 ENCOUNTER — Other Ambulatory Visit: Payer: Self-pay

## 2018-07-02 ENCOUNTER — Encounter (HOSPITAL_COMMUNITY): Payer: Self-pay | Admitting: Emergency Medicine

## 2018-07-02 ENCOUNTER — Emergency Department (HOSPITAL_COMMUNITY)
Admission: EM | Admit: 2018-07-02 | Discharge: 2018-07-02 | Disposition: A | Discharge status: Home / Self Care | Payer: Medicare Other | Attending: Emergency Medicine | Admitting: Emergency Medicine

## 2018-07-02 ENCOUNTER — Telehealth: Payer: Self-pay

## 2018-07-02 ENCOUNTER — Telehealth: Payer: Self-pay | Admitting: Pulmonary Disease

## 2018-07-02 ENCOUNTER — Telehealth: Payer: Self-pay | Admitting: Medical Oncology

## 2018-07-02 DIAGNOSIS — Z79899 Other long term (current) drug therapy: Secondary | ICD-10-CM | POA: Insufficient documentation

## 2018-07-02 DIAGNOSIS — Z87891 Personal history of nicotine dependence: Secondary | ICD-10-CM | POA: Diagnosis not present

## 2018-07-02 DIAGNOSIS — R600 Localized edema: Secondary | ICD-10-CM | POA: Insufficient documentation

## 2018-07-02 DIAGNOSIS — R0902 Hypoxemia: Secondary | ICD-10-CM | POA: Diagnosis not present

## 2018-07-02 DIAGNOSIS — Z7982 Long term (current) use of aspirin: Secondary | ICD-10-CM | POA: Insufficient documentation

## 2018-07-02 DIAGNOSIS — J90 Pleural effusion, not elsewhere classified: Secondary | ICD-10-CM | POA: Insufficient documentation

## 2018-07-02 DIAGNOSIS — R0602 Shortness of breath: Secondary | ICD-10-CM | POA: Diagnosis not present

## 2018-07-02 DIAGNOSIS — Z7902 Long term (current) use of antithrombotics/antiplatelets: Secondary | ICD-10-CM | POA: Insufficient documentation

## 2018-07-02 DIAGNOSIS — E1122 Type 2 diabetes mellitus with diabetic chronic kidney disease: Secondary | ICD-10-CM | POA: Insufficient documentation

## 2018-07-02 DIAGNOSIS — I2510 Atherosclerotic heart disease of native coronary artery without angina pectoris: Secondary | ICD-10-CM | POA: Insufficient documentation

## 2018-07-02 DIAGNOSIS — Z91040 Latex allergy status: Secondary | ICD-10-CM | POA: Diagnosis not present

## 2018-07-02 DIAGNOSIS — Z951 Presence of aortocoronary bypass graft: Secondary | ICD-10-CM | POA: Diagnosis not present

## 2018-07-02 DIAGNOSIS — I129 Hypertensive chronic kidney disease with stage 1 through stage 4 chronic kidney disease, or unspecified chronic kidney disease: Secondary | ICD-10-CM | POA: Diagnosis not present

## 2018-07-02 DIAGNOSIS — Z85118 Personal history of other malignant neoplasm of bronchus and lung: Secondary | ICD-10-CM | POA: Diagnosis not present

## 2018-07-02 DIAGNOSIS — N183 Chronic kidney disease, stage 3 (moderate): Secondary | ICD-10-CM | POA: Diagnosis not present

## 2018-07-02 DIAGNOSIS — Z794 Long term (current) use of insulin: Secondary | ICD-10-CM | POA: Insufficient documentation

## 2018-07-02 LAB — CBC WITH DIFFERENTIAL/PLATELET
ABS IMMATURE GRANULOCYTES: 0.05 10*3/uL (ref 0.00–0.07)
Basophils Absolute: 0.1 10*3/uL (ref 0.0–0.1)
Basophils Relative: 0 %
Eosinophils Absolute: 0.1 10*3/uL (ref 0.0–0.5)
Eosinophils Relative: 1 %
HEMATOCRIT: 41.5 % (ref 36.0–46.0)
HEMOGLOBIN: 12.1 g/dL (ref 12.0–15.0)
Immature Granulocytes: 0 %
LYMPHS PCT: 12 %
Lymphs Abs: 1.4 10*3/uL (ref 0.7–4.0)
MCH: 29 pg (ref 26.0–34.0)
MCHC: 29.2 g/dL — AB (ref 30.0–36.0)
MCV: 99.5 fL (ref 80.0–100.0)
MONO ABS: 0.8 10*3/uL (ref 0.1–1.0)
Monocytes Relative: 7 %
NEUTROS ABS: 9.4 10*3/uL — AB (ref 1.7–7.7)
Neutrophils Relative %: 80 %
Platelets: 145 10*3/uL — ABNORMAL LOW (ref 150–400)
RBC: 4.17 MIL/uL (ref 3.87–5.11)
RDW: 14.3 % (ref 11.5–15.5)
WBC: 11.8 10*3/uL — ABNORMAL HIGH (ref 4.0–10.5)
nRBC: 0 % (ref 0.0–0.2)

## 2018-07-02 LAB — TROPONIN I: TROPONIN I: 0.03 ng/mL — AB (ref <0.03)

## 2018-07-02 LAB — COMPREHENSIVE METABOLIC PANEL
ALBUMIN: 2.4 g/dL — AB (ref 3.5–5.0)
ALT: 12 U/L (ref 0–44)
AST: 14 U/L — AB (ref 15–41)
Alkaline Phosphatase: 49 U/L (ref 38–126)
Anion gap: 9 (ref 5–15)
BUN: 12 mg/dL (ref 8–23)
CHLORIDE: 101 mmol/L (ref 98–111)
CO2: 34 mmol/L — ABNORMAL HIGH (ref 22–32)
Calcium: 9.4 mg/dL (ref 8.9–10.3)
Creatinine, Ser: 1 mg/dL (ref 0.44–1.00)
GFR calc Af Amer: >60 mL/min (ref >60)
GFR calc non Af Amer: 56 mL/min — ABNORMAL LOW (ref >60)
GLUCOSE: 144 mg/dL — AB (ref 70–99)
Potassium: 3.6 mmol/L (ref 3.5–5.1)
Sodium: 144 mmol/L (ref 135–145)
Total Bilirubin: 0.2 mg/dL — ABNORMAL LOW (ref 0.3–1.2)
Total Protein: 6 g/dL — ABNORMAL LOW (ref 6.5–8.1)

## 2018-07-02 LAB — BRAIN NATRIURETIC PEPTIDE: B NATRIURETIC PEPTIDE 5: 114.2 pg/mL — AB (ref 0.0–100.0)

## 2018-07-02 MED ORDER — FUROSEMIDE 10 MG/ML IJ SOLN
40.0000 mg | Freq: Once | INTRAMUSCULAR | Status: AC
  Administered 2018-07-02: 40 mg via INTRAVENOUS
  Filled 2018-07-02: qty 4

## 2018-07-02 MED ORDER — IOPAMIDOL (ISOVUE-370) INJECTION 76%
INTRAVENOUS | Status: AC
  Filled 2018-07-02: qty 100

## 2018-07-02 MED ORDER — POTASSIUM CHLORIDE CRYS ER 20 MEQ PO TBCR: 20.0000 meq | EXTENDED_RELEASE_TABLET | Freq: Every day | ORAL | 0 refills | Status: DC

## 2018-07-02 MED ORDER — IOPAMIDOL (ISOVUE-370) INJECTION 76%
100.0000 mL | Freq: Once | INTRAVENOUS | Status: AC | PRN
  Administered 2018-07-02: 100 mL via INTRAVENOUS

## 2018-07-02 MED ORDER — POTASSIUM CHLORIDE CRYS ER 20 MEQ PO TBCR
20.0000 meq | EXTENDED_RELEASE_TABLET | Freq: Once | ORAL | Status: AC
  Administered 2018-07-02: 20 meq via ORAL
  Filled 2018-07-02: qty 1

## 2018-07-02 NOTE — ED Provider Notes (Signed)
[facility] EMERGENCY DEPARTMENT Provider Note   CSN: [id number] Arrival date & time: 07/02/18  1120     History   Chief Complaint Chief Complaint  Patient presents with  . Shortness of Breath    HPI [name] is a 72 y.o. female.  HPI Patient presents from home with shortness of breath.  On chronic oxygen that is needed to be increased to keep her sats above 80.  Initially is on 2 L but had to be increased to 4 L and is still having sats at home that will be right around 80%.  Has a history of presumed lung cancer on CT had a recent thoracentesis.  Scheduled for bronchoscopy and PET scan.  Called pulmonology today and was told to come into the ER for further evaluation.  Some swelling in her legs.  Has had a cough with some mild sputum production.  Patient states she cannot exert herself or lay flat. Past Medical History:  Diagnosis Date  . Atrial fibrillation (HCC)    paroxysmal  . CAD (coronary artery disease)    X5  . Chronic kidney disease   . Diabetes mellitus   . Gastritis   . GI bleeding   . Hiatal hernia   . Hyperglycemia   . Hyperlipidemia   . Hypertension   . Obesity   . Obstructive sleep apnea   . Osteopenia   . Peripheral artery disease (HCC)    lower extremities  . Stroke ([place])   . Systolic murmur   . Ulcer   . Vitamin D deficiency     Patient Active Problem List   Diagnosis Date Noted  . Pleural effusion on right 06/28/2018  . Pulmonary nodules/lesions, multiple 06/23/2018  . AF (paroxysmal atrial fibrillation) ([place]) 06/18/2018  . Diabetic gastroparesis ([place]) 06/18/2018  . Protein calorie malnutrition ([place]) 06/18/2018  . GERD (gastroesophageal reflux disease) 06/18/2018  . Acute hypoxemic respiratory failure ([place]) 06/17/2018  . Bilateral carotid artery disease ([place]) 11/30/2015  . Encounter for loop recorder at end of battery life 05/22/2014  . CKD (chronic kidney disease) stage 3, GFR 30-59 ml/min (HCC) 05/22/2014  .  History of stroke 05/22/2014  . 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 Mild aortic stenosis by prior echocardiogram 05/22/2014  . Atrial tachycardia, paroxysmal ([place]) 02/10/2013  . Hyperlipidemia   . Hypertension   . Sacral decubitus ulcer   . Diabetes mellitus   . Systolic murmur   . Hyperglycemia   . PAD (peripheral artery disease) ([place])   . CAD (coronary artery disease) CABG x5 2011   . Obstructive sleep apnea   . Vitamin D deficiency     Past Surgical History:  Procedure Laterality Date  . ANGIOPLASTY / STENTING FEMORAL Right    leg  . Carotid doppler  10/13/2011   right ICA-49% diameter reduction and left ICA with 50%-69% diameter reduction  . CORONARY ARTERY BYPASS GRAFT  05/22/2010  . Heart bypass    . Myoview Stress  08/13/2010   inferolateral scar without ischemia     OB History   None      Home Medications    Prior to Admission medications   Medication Sig Start Date End Date Taking? Authorizing Provider  acetaminophen (TYLENOL) 325 MG tablet Take 650 mg by mouth every 6 (six) hours as needed for mild pain or headache.    Yes [provider]  albuterol (PROVENTIL HFA;VENTOLIN HFA) 108 (90 Base) MCG/ACT inhaler Inhale  2 puffs into the lungs every 6 (six) hours as needed for wheezing or shortness of breath. 06/19/18  Yes Amin, Jeanella Flattery, MD  ALPRAZolam (XANAX) 1 MG tablet Take 0.5 mg by mouth 3 (three) times daily as needed for anxiety or sleep.   Yes [provider]  amLODipine (NORVASC) 10 MG tablet Take 10 mg by mouth daily.     Yes [provider]  furosemide (LASIX) 40 MG tablet Take 1 tablet (40 mg total) by mouth daily. Patient taking differently: Take 40 mg by mouth daily as needed for fluid.  08/05/17  Yes Lorretta Harp, MD  hydrALAZINE (APRESOLINE) 25 MG tablet Take 1 tablet (25 mg total) by mouth daily as needed. Patient taking differently: Take 25 mg by mouth daily.  08/05/17  Yes Lorretta Harp, MD  HYDROcodone-acetaminophen (NORCO) 5-325 MG tablet Take 1 tablet by mouth every 6 (six) hours as needed for moderate pain. 06/25/18  Yes Susy Frizzle, MD  metoCLOPramide (REGLAN) 5 MG tablet TAKE 1 TABLET BY MOUTH 3 TIMES A DAY BEFORE MEALS AND 1 AT BEDTIME Patient taking differently: Take 5 mg by mouth See admin instructions. TAKE 1 TABLET BY MOUTH 3 TIMES A DAY BEFORE MEALS AND 1 AT BEDTIME 06/19/17  Yes Susy Frizzle, MD  pantoprazole (PROTONIX) 40 MG tablet TAKE 1 TABLET BY MOUTH EVERY DAY Patient taking differently: Take 40 mg by mouth daily.  08/07/17  Yes Susy Frizzle, MD  phenylephrine (SUDAFED PE) 10 MG TABS tablet Take 10 mg by mouth 2 (two) times daily.    Yes [provider]  promethazine (PHENERGAN) 25 MG tablet Take 1 tablet (25 mg total) by mouth every 8 (eight) hours as needed for nausea or vomiting. 06/30/18  Yes Susy Frizzle, MD  rosuvastatin (CRESTOR) 10 MG tablet Take 1 tablet (10 mg total) by mouth daily. 06/25/18  Yes Susy Frizzle, MD  silver sulfADIAZINE (SILVADENE) 1 % cream Apply 1 application topically daily. 06/25/18  Yes Pickard, Cammie Mcgee, MD  TRADJENTA 5 MG TABS tablet TAKE 1 TABLET BY MOUTH EVERY DAY Patient taking differently: Take 5 mg by mouth daily.  04/29/18  Yes Susy Frizzle, MD  ALPRAZolam (XANAX) 0.25 MG tablet TAKE 2 TABLETS (0.5MG ) BY MOUTH 3 TIMES A DAY AS NEEDED FOR ANXIETY Patient not taking: No sig reported 06/25/18   Susy Frizzle, MD  ALPRAZolam Duanne Moron) 1 MG tablet Take 0.5 tablets (0.5 mg total) by mouth 3 (three) times daily as needed for anxiety. Patient not taking: Reported on 07/02/2018 06/30/18   Alycia Rossetti, MD  aspirin 81 MG tablet Take 81 mg by mouth daily.     [provider]  B-D ULTRAFINE III SHORT PEN 31G X 8 MM MISC USE AS DIRECTED 05/11/18   Susy Frizzle, MD  clopidogrel (PLAVIX) 75 MG tablet Take 1 tablet (75 mg total) by mouth daily. Patient not taking: Reported  on 07/02/2018 08/05/17   Lorretta Harp, MD  [employer] VITAMIN D 2000 units CAPS TAKE ONE CAPSULE BY MOUTH DAILY Patient not taking: Reported on 07/02/2018 12/19/16   Susy Frizzle, MD  LEVEMIR FLEXTOUCH 100 UNIT/ML Pen INJECT 80 UNITS INTO THE SKIN 2 TIMES DAILY. Patient taking differently: Inject 50 Units into the skin 2 (two) times daily.  05/03/18   Susy Frizzle, MD  lisinopril (PRINIVIL,ZESTRIL) 20 MG tablet TAKE 1 TABLET (20 MG TOTAL) BY MOUTH DAILY. 12/29/17   Susy Frizzle, MD  niacin (NIASPAN) 750 MG CR tablet TAKE 1 TABLET BY MOUTH EVERY DAY AT BEDTIME Patient not taking: No sig reported 10/27/17   Susy Frizzle, MD  potassium chloride SA (K-DUR,KLOR-CON) 20 MEQ tablet Take 1 tablet (20 mEq total) by mouth daily. 07/02/18   Sherwood Gambler, MD    Family History Family History  Problem Relation Age of Onset  . Cancer Mother        Did not know what kind  . COPD Brother   . Diabetes Sister   . Heart disease Brother   . Colon cancer Neg Hx   . Colon polyps Neg Hx   . Gallbladder disease Neg Hx   . Kidney disease Neg Hx   . Esophageal cancer Neg Hx     Social History Social History   Tobacco Use  . Smoking status: Former Smoker    Packs/day: 1.00    Years: 52.00    Pack years: 52.00    Start date: 08/04/1957    Last attempt to quit: 01/22/2010    Years since quitting: 8.4  . Smokeless tobacco: Never Used  Substance Use Topics  . Alcohol use: No  . Drug use: No     Allergies   Sulfa antibiotics; Latex; and Neosporin [neomycin-polymyxin-gramicidin]   Review of Systems Review of Systems  Constitutional: Positive for appetite change.  HENT: Negative for congestion.   Respiratory: Positive for cough and shortness of breath.   Cardiovascular: Negative for chest pain.  Gastrointestinal: Negative for abdominal pain.  Genitourinary: Negative for difficulty urinating.  Musculoskeletal: Negative for back pain.  Skin: Negative for rash.  Neurological: Positive  for weakness.  Psychiatric/Behavioral: Negative for confusion.     Physical Exam Updated Vital Signs BP (!) 173/59   Pulse 68   Temp 98.2 F (36.8 C) (Oral)   Resp 19   SpO2 100% Comment: 6L O2  Physical Exam  Constitutional: She appears well-developed.  HENT:  Head: Normocephalic.  Neck: Neck supple.  Cardiovascular:  Mildly irregular rhythm.  Pulmonary/Chest:  Somewhat diffuse harsh breath sounds.  Abdominal: There is no tenderness.  Musculoskeletal:       Right lower leg: She exhibits edema.       Left lower leg: She exhibits edema.  Some pitting edema bilateral lower extremities.  Neurological: She is alert.  Skin: Capillary refill takes less than 2 seconds.  Nursing note and vitals reviewed.    ED Treatments / Results  Labs (all labs ordered are listed, but only abnormal results are displayed) Labs Reviewed  COMPREHENSIVE METABOLIC PANEL - Abnormal; Notable for the following components:      Result Value   CO2 34 (*)    Glucose, Bld 144 (*)    Total Protein 6.0 (*)    Albumin 2.4 (*)    AST 14 (*)    Total Bilirubin 0.2 (*)    GFR calc non Af Amer 56 (*)    All other components within normal limits  CBC WITH DIFFERENTIAL/PLATELET - Abnormal; Notable for the following components:   WBC 11.8 (*)    MCHC 29.2 (*)    Platelets 145 (*)    Neutro Abs 9.4 (*)    All other components within normal limits  TROPONIN I - Abnormal; Notable for the following components:   Troponin I 0.03 (*)    All other components within normal limits  BRAIN NATRIURETIC PEPTIDE - Abnormal; Notable for the following components:   B Natriuretic Peptide 114.2 (*)  All other components within normal limits    EKG EKG Interpretation  Date/Time:  Friday July 02 2018 16:22:38 EST Ventricular Rate:  70 PR Interval:    QRS Duration: 96 QT Interval:  416 QTC Calculation: 449 R Axis:   27 Text Interpretation:  Sinus rhythm Ventricular premature complex Borderline short PR  interval Low voltage, precordial leads no significant change since earlier in the day Confirmed by Sherwood Gambler [phone]) on 07/02/2018 4:30:04 PM   Radiology Dg Chest 2 View  Result Date: 07/02/2018 CLINICAL DATA:  Shortness of breath. EXAM: CHEST - 2 VIEW COMPARISON:  06/28/2018. FINDINGS: Monitoring device noted over the chest. Prior CABG. Heart size stable. Multiple bilateral pulmonary mass lesions consistent metastatic disease again noted. Stable small bilateral pleural effusions. No pneumothorax. IMPRESSION: Multiple bilateral pulmonary mass lesions consistent metastatic disease again noted. Stable small bilateral pleural effusions. Electronically Signed   By: Marcello Moores  Register   On: 07/02/2018 12:11   Ct Angio Chest Pe W And/or Wo Contrast  Result Date: 07/02/2018 CLINICAL DATA:  Worsening shortness of breath since thoracentesis 1 week ago. EXAM: CT ANGIOGRAPHY CHEST WITH CONTRAST TECHNIQUE: Multidetector CT imaging of the chest was performed using the standard protocol during bolus administration of intravenous contrast. Multiplanar CT image reconstructions and MIPs were obtained to evaluate the vascular anatomy. CONTRAST:  70 mL ISOVUE-370 IOPAMIDOL (ISOVUE-370) INJECTION 76% COMPARISON:  06/18/2018 FINDINGS: Cardiovascular: Heart is normal size. Mild calcified plaque over the thoracic aorta. Median sternotomy wires are present. There is calcified plaque over the coronary arteries. Pulmonary arterial system is well opacified. No evidence of pulmonary emboli. Mediastinum/Nodes: 1.6 cm AP window lymph node and 1 cm prevascular lymph node without significant change. Subcarinal adenopathy without significant change. Mild right hilar adenopathy without significant change. Lungs/Pleura: Lungs are adequately inflated demonstrate no change in a small to moderate size right pleural effusion with new small to moderate size left pleural effusion with associated bibasilar atelectasis. No significant change  in a 4.0 x 4.1 cm mass over the medial right upper lobe abutting the mediastinum. No change in numerous round bilateral pulmonary nodules compatible with metastatic disease. No significant change in 3.7 cm mass over the posterior right middle lobe. Upper Abdomen: Previous cholecystectomy. Somewhat nodular contour to the liver without focal mass. Few small bilateral renal calcifications likely stones. No significant adenopathy or free fluid. Musculoskeletal: Degenerative change of the spine. Review of the MIP images confirms the above findings. IMPRESSION: Stable changes including a 4.0 x 4.1 cm medial right upper lobe lung mass abutting the mediastinum, numerous pulmonary nodules/masses without significant change compatible with metastatic disease, small to moderate size right pleural effusion with associated basilar atelectasis and mediastinal/hilar adenopathy. New small to moderate size left pleural effusion with basilar atelectasis. No evidence of pulmonary embolism. Aortic Atherosclerosis (ICD10-I70.0). Atherosclerotic coronary artery disease. Nephrolithiasis. Mild nodular contour of the liver which may be due to cirrhosis. Electronically Signed   By: Marin Olp M.D.   On: 07/02/2018 15:34    Procedures Procedures (including critical care time)  Medications Ordered in ED Medications  iopamidol (ISOVUE-370) 76 % injection 100 mL (100 mLs Intravenous Contrast Given 07/02/18 1505)  furosemide (LASIX) injection 40 mg (40 mg Intravenous Given 07/02/18 1633)  potassium chloride SA (K-DUR,KLOR-CON) CR tablet 20 mEq (20 mEq Oral Given 07/02/18 1633)     Initial Impression / Assessment and Plan / ED Course  I have reviewed the triage vital signs and the nursing notes.  Pertinent labs & imaging results that  were available during my care of the patient were reviewed by me and considered in my medical decision making (see chart for details).     Patient with likely metastatic cancer.  Has had a  thoracentesis.  On chronic oxygen at home but more short of breath.  Sats would go down into the 80s.  Cannot lay flat.  Will get CT PE to evaluate for possible malignancy since x-ray is stable although it is a bad x-ray.  Care turned over to Dr. Regenia Skeeter. I think she will likely require admission.  Final Clinical Impressions(s) / ED Diagnoses   Final diagnoses:  Hypoxia  Bilateral pleural effusion    ED Discharge Orders         Ordered    potassium chloride SA (K-DUR,KLOR-CON) 20 MEQ tablet  Daily     07/02/18 1620           Davonna Belling, MD 07/03/18 0700

## 2018-07-02 NOTE — Telephone Encounter (Signed)
Called and spoke with patients care giver Mr. Justus Memory. He stated that patient is currently on 2 liters of 02 but her oxygen keeps dropping into the low 80's. Patient is also having choking episodes. Denies chest pain but is very weak. Advised Mr. Justus Memory that due to the symptoms that she is having, as well as speaking with BW she suggested patient go as well. Nothing further needed.

## 2018-07-02 NOTE — ED Notes (Signed)
Patient verbalizes understanding of discharge instructions. Opportunity for questioning and answers were provided. Armband removed by staff, pt discharged from ED.  

## 2018-07-02 NOTE — Telephone Encounter (Signed)
Phone call placed to introduce Palliative care and offer to schedule a visit with NP. VM left

## 2018-07-02 NOTE — Discharge Instructions (Addendum)
It is important to take the Lasix once per day every day, at least over the next 5 days.  While doing this, you will be prescribed a small amount of potassium.  If you develop worsening shortness of breath or if you develop fever, increased oxygen need, chest pain, or any other new/concerning symptoms and return to the ER for evaluation.

## 2018-07-02 NOTE — ED Triage Notes (Addendum)
Pt presents to ED for assessment with her close friend of increasing SOB since a thoracentesis last Wednesday.  Patient went home on 2L O2, normally sating 94%, patient's friend noted 80% last night, increased her oxygen level and patient is hovering right at 90%.  Patient unable to tolerate any exertion.  Patient had repeat xray which showed increasing fluid again.  Concerns for metastatic lung cancer.  Pulmonologist instructed friend to bring her to ED today.

## 2018-07-02 NOTE — Telephone Encounter (Signed)
[name] reports that pt is experiencing worsening sob and decreased oxygen sat.I. I instructed [name] to contact Pulmonary  With her worsening symptoms .

## 2018-07-02 NOTE — ED Notes (Signed)
Pt transported to CT ?

## 2018-07-02 NOTE — Telephone Encounter (Signed)
Mr. [name] was told to stay by the phone and that we are closing at 12:00 today.

## 2018-07-02 NOTE — ED Provider Notes (Addendum)
Care transferred to me.  The patient is currently resting comfortably.  CT scan shows chronic mass/metastatic disease.  However there is a recurrent right pleural effusion and now a left pleural effusion.  The patient does not appear to have any significant increased work of breathing or distress.  No fevers.  I do not think this is infected.  I think she will need diuresis given the leg swelling that has been noticed.  She is noted to have a troponin of 0.03, but in this setting I doubt ACS. Likely from increased fluid. Family indicates she has had Lasix once, a couple days ago.  Otherwise they only use it as needed and have and had to use it in over a month.  I think she will need diuresis.  Offered for her to be admitted to the hospital but patient and family both want to go home.  They feel like they can adequately treat her at home.  She will be given a dose of IV Lasix and encouraged to use the Lasix once a day over the next 5 days.  Follow-up closely with PCP.  We discussed strict return precautions, but I think is reasonable to discharge home at this time.   EKG Interpretation  Date/Time:  Friday July 02 2018 16:22:38 EST Ventricular Rate:  70 PR Interval:    QRS Duration: 96 QT Interval:  416 QTC Calculation: 449 R Axis:   27 Text Interpretation:  Sinus rhythm Ventricular premature complex Borderline short PR interval Low voltage, precordial leads no significant change since earlier in the day Confirmed by Sherwood Gambler [phone]) on 07/02/2018 4:30:04 PM        Results for orders placed or performed during the hospital encounter of 07/02/18  Comprehensive metabolic panel  Result Value Ref Range   Sodium 144 135 - 145 mmol/L   Potassium 3.6 3.5 - 5.1 mmol/L   Chloride 101 98 - 111 mmol/L   CO2 34 (H) 22 - 32 mmol/L   Glucose, Bld 144 (H) 70 - 99 mg/dL   BUN 12 8 - 23 mg/dL   Creatinine, Ser 1.00 0.44 - 1.00 mg/dL   Calcium 9.4 8.9 - 10.3 mg/dL   Total Protein 6.0 (L) 6.5 - 8.1  g/dL   Albumin 2.4 (L) 3.5 - 5.0 g/dL   AST 14 (L) 15 - 41 U/L   ALT 12 0 - 44 U/L   Alkaline Phosphatase 49 38 - 126 U/L   Total Bilirubin 0.2 (L) 0.3 - 1.2 mg/dL   GFR calc non Af Amer 56 (L) >60 mL/min   GFR calc Af Amer >60 >60 mL/min   Anion gap 9 5 - 15  CBC with Differential  Result Value Ref Range   WBC 11.8 (H) 4.0 - 10.5 K/uL   RBC 4.17 3.87 - 5.11 MIL/uL   Hemoglobin 12.1 12.0 - 15.0 g/dL   HCT 41.5 36.0 - 46.0 %   MCV 99.5 80.0 - 100.0 fL   MCH 29.0 26.0 - 34.0 pg   MCHC 29.2 (L) 30.0 - 36.0 g/dL   RDW 14.3 11.5 - 15.5 %   Platelets 145 (L) 150 - 400 K/uL   nRBC 0.0 0.0 - 0.2 %   Neutrophils Relative % 80 %   Neutro Abs 9.4 (H) 1.7 - 7.7 K/uL   Lymphocytes Relative 12 %   Lymphs Abs 1.4 0.7 - 4.0 K/uL   Monocytes Relative 7 %   Monocytes Absolute 0.8 0.1 - 1.0 K/uL  Eosinophils Relative 1 %   Eosinophils Absolute 0.1 0.0 - 0.5 K/uL   Basophils Relative 0 %   Basophils Absolute 0.1 0.0 - 0.1 K/uL   Immature Granulocytes 0 %   Abs Immature Granulocytes 0.05 0.00 - 0.07 K/uL  Troponin I - Once  Result Value Ref Range   Troponin I 0.03 (HH) <0.03 ng/mL  Brain natriuretic peptide  Result Value Ref Range   B Natriuretic Peptide 114.2 (H) 0.0 - 100.0 pg/mL   Dg Chest 1 View  Result Date: 06/24/2018 CLINICAL DATA:  Status post right thoracentesis. EXAM: CHEST  1 VIEW COMPARISON:  Chest x-ray dated June 17, 2018. FINDINGS: Stable cardiomediastinal silhouette status post CABG. Innumerable bilateral pulmonary nodules are unchanged. No significant residual right pleural effusion. Unchanged trace left pleural effusion. No pneumothorax. No acute osseous abnormality. IMPRESSION: 1. No residual right pleural effusion status post thoracentesis. No pneumothorax. 2. Unchanged diffuse pulmonary metastatic disease. Electronically Signed   By: Titus Dubin M.D.   On: 06/24/2018 15:16   Dg Chest 2 View  Result Date: 07/02/2018 CLINICAL DATA:  Shortness of breath. EXAM:  CHEST - 2 VIEW COMPARISON:  06/28/2018. FINDINGS: Monitoring device noted over the chest. Prior CABG. Heart size stable. Multiple bilateral pulmonary mass lesions consistent metastatic disease again noted. Stable small bilateral pleural effusions. No pneumothorax. IMPRESSION: Multiple bilateral pulmonary mass lesions consistent metastatic disease again noted. Stable small bilateral pleural effusions. Electronically Signed   By: Marcello Moores  Register   On: 07/02/2018 12:11   Dg Chest 2 View  Result Date: 06/28/2018 CLINICAL DATA:  Status post right-sided thoracentesis on June 24, 2018. EXAM: CHEST - 2 VIEW COMPARISON:  Portable chest x-ray of June 24, 2018 FINDINGS: A small amount of pleural fluid has reaccumulated on the right. There is a trace of pleural fluid on the left as well. Multiple widespread pulmonary parenchymal masses of varying sizes are present. The heart and pulmonary vascularity are normal. The patient has undergone previous CABG. There is calcification in the wall of the aortic arch. The sternal wires are intact. A presumed cardiac loop recorder is located over the left cardiac apex. IMPRESSION: Interval reaccumulation of a small amount of pleural fluid on the right. A smaller left pleural effusion is present but stable. Multiple widespread pulmonary metastatic nodules. Electronically Signed   By: David  Martinique M.D.   On: 06/28/2018 15:07   Ct Chest W Contrast  Result Date: 06/18/2018 CLINICAL DATA:  Changes consistent with pulmonary metastatic disease on recent chest x-ray EXAM: CT CHEST WITH CONTRAST TECHNIQUE: Multidetector CT imaging of the chest was performed during intravenous contrast administration. CONTRAST:  9mL OMNIPAQUE IOHEXOL 300 MG/ML  SOLN COMPARISON:  Chest x-ray from the previous day FINDINGS: Cardiovascular: Thoracic aorta demonstrates atherosclerotic calcifications without aneurysmal dilatation or dissection. Changes of coronary bypass grafting are noted. No cardiac  enlargement is seen. Coronary calcifications are seen. The pulmonary artery demonstrates a normal branching pattern without central pulmonary emboli. Loop recorder is noted in the left chest wall anteriorly. Mediastinum/Nodes: Thoracic inlet is within normal limits. Scattered lymph nodes are noted throughout the mediastinum. The largest of these lies in the subcarinal region measuring 19 mm in short axis. Hilar adenopathy is noted bilaterally most prominent in the right infrahilar region measuring 18 mm in short axis. This likely represents a conglomeration of multiple smaller nodes. In the right suprahilar region and radiating into the upper lobe on the right there is a ovoid soft tissue mass identified which measures 4.6 by 2.6  cm in greatest dimension. Some narrowing of the right upper lobe bronchus is noted although it appears patent. The esophagus as visualized is within normal limits. Lungs/Pleura: A large right-sided pleural effusion is noted. Multiple, too numerous to count pulmonary nodules are identified consistent with metastatic disease. The largest of these lies in the right upper lobe along the major fissure measuring 3.6 by 2.5 cm in greatest dimension. The large central right suprahilar mass lesion suggests a primary etiology with diffuse pulmonary metastatic disease. Bronchoscopic evaluation with biopsy is recommended. Upper Abdomen: Visualized upper abdomen demonstrates the adrenal glands to be within normal limits. Nonobstructing left renal stone is noted which measures 6 mm in greatest dimension. Renal vascular calcifications are noted as well. The remainder of the upper abdomen is unremarkable. Musculoskeletal: No acute bony abnormality is noted. Degenerative changes of the thoracic spine are seen. 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 Right-sided  pleural effusion is noted as well. Referral for multidisciplinary workup is recommended likely to include PET-CT and tissue sampling. Nonobstructing left renal stone. Aortic Atherosclerosis (ICD10-I70.0). Electronically Signed   By: Inez Catalina M.D.   On: 06/18/2018 09:58   Ct Angio Chest Pe W And/or Wo Contrast  Result Date: 07/02/2018 CLINICAL DATA:  Worsening shortness of breath since thoracentesis 1 week ago. EXAM: CT ANGIOGRAPHY CHEST WITH CONTRAST TECHNIQUE: Multidetector CT imaging of the chest was performed using the standard protocol during bolus administration of intravenous contrast. Multiplanar CT image reconstructions and MIPs were obtained to evaluate the vascular anatomy. CONTRAST:  70 mL ISOVUE-370 IOPAMIDOL (ISOVUE-370) INJECTION 76% COMPARISON:  06/18/2018 FINDINGS: Cardiovascular: Heart is normal size. Mild calcified plaque over the thoracic aorta. Median sternotomy wires are present. There is calcified plaque over the coronary arteries. Pulmonary arterial system is well opacified. No evidence of pulmonary emboli. Mediastinum/Nodes: 1.6 cm AP window lymph node and 1 cm prevascular lymph node without significant change. Subcarinal adenopathy without significant change. Mild right hilar adenopathy without significant change. Lungs/Pleura: Lungs are adequately inflated demonstrate no change in a small to moderate size right pleural effusion with new small to moderate size left pleural effusion with associated bibasilar atelectasis. No significant change in a 4.0 x 4.1 cm mass over the medial right upper lobe abutting the mediastinum. No change in numerous round bilateral pulmonary nodules compatible with metastatic disease. No significant change in 3.7 cm mass over the posterior right middle lobe. Upper Abdomen: Previous cholecystectomy. Somewhat nodular contour to the liver without focal mass. Few small bilateral renal calcifications likely stones. No significant adenopathy or free fluid.  Musculoskeletal: Degenerative change of the spine. Review of the MIP images confirms the above findings. IMPRESSION: Stable changes including a 4.0 x 4.1 cm medial right upper lobe lung mass abutting the mediastinum, numerous pulmonary nodules/masses without significant change compatible with metastatic disease, small to moderate size right pleural effusion with associated basilar atelectasis and mediastinal/hilar adenopathy. New small to moderate size left pleural effusion with basilar atelectasis. No evidence of pulmonary embolism. Aortic Atherosclerosis (ICD10-I70.0). Atherosclerotic coronary artery disease. Nephrolithiasis. Mild nodular contour of the liver which may be due to cirrhosis. Electronically Signed   By: Marin Olp M.D.   On: 07/02/2018 15:34   Dg Chest Port 1 View  Result Date: 06/17/2018 CLINICAL DATA:  Dyspnea today EXAM: PORTABLE CHEST 1 VIEW COMPARISON:  06/20/2010 FINDINGS: Multiple bilateral masslike opacities are seen scattered throughout the lungs consistent with metastasis. Heart size  and mediastinal contours are stable with aortic atherosclerosis. Median sternotomy sutures are in place. Cardiac monitoring device projects over the left lateral hemithorax. No aggressive osseous lesions. Mild vascular congestion is noted. 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 Mild vascular congestion. Electronically Signed   By: Ashley Royalty M.D.   On: 06/17/2018 22:17   US Thoracentesis Asp Pleural Space W/img Guide  Result Date: 06/24/2018 INDICATION: Patient with CT findings highly suspicious of lung cancer, dyspnea, and right pleural effusion. Request is made for diagnostic and therapeutic right thoracentesis. EXAM: ULTRASOUND GUIDED DIAGNOSTIC AND THERAPEUTIC RIGHT THORACENTESIS MEDICATIONS: 10 mL 1% lidocaine COMPLICATIONS: None immediate. PROCEDURE: An ultrasound guided thoracentesis was thoroughly discussed with the patient and questions answered. The benefits,  risks, alternatives and complications were also discussed. The patient understands and wishes to proceed with the procedure. Written consent was obtained. Ultrasound was performed to localize and mark an adequate pocket of fluid in the right chest. The area was then prepped and draped in the normal sterile fashion. 1% Lidocaine was used for local anesthesia. Under ultrasound guidance a 6 Fr Safe-T-Centesis catheter was introduced. Thoracentesis was performed. The catheter was removed and a dressing applied. FINDINGS: A total of approximately 650 mL of hazy yellow fluid was removed. Samples were sent to the laboratory as requested by the clinical team. IMPRESSION: Successful ultrasound guided right thoracentesis yielding 650 mL of pleural fluid. Read by: Earley Abide, PA-C Electronically Signed   By: Aletta Edouard M.D.   On: 06/24/2018 15:22      Sherwood Gambler, MD 07/02/18 1606    Sherwood Gambler, MD 07/02/18 1630

## 2018-07-03 ENCOUNTER — Encounter: Payer: Self-pay | Admitting: Internal Medicine

## 2018-07-03 ENCOUNTER — Ambulatory Visit (HOSPITAL_COMMUNITY)
Admission: RE | Admit: 2018-07-03 | Discharge: 2018-07-03 | Disposition: A | Discharge status: Home / Self Care | Payer: Medicare Other | Source: Ambulatory Visit | Attending: Internal Medicine | Admitting: Internal Medicine

## 2018-07-03 DIAGNOSIS — C3490 Malignant neoplasm of unspecified part of unspecified bronchus or lung: Secondary | ICD-10-CM

## 2018-07-05 ENCOUNTER — Other Ambulatory Visit: Payer: Self-pay

## 2018-07-05 ENCOUNTER — Encounter (HOSPITAL_COMMUNITY): Payer: Self-pay | Admitting: *Deleted

## 2018-07-05 NOTE — Progress Notes (Signed)
Pt stated that she is oxygen dependant (2L via [place]) . Pt stated that she is under the care of Dr. Gwenlyn Found, Cardiology and that she has a non- functioning Loop Recorder. Pt requested that caregiver, Vania Rea be provided with pre-op instructions. [name] stated that pt is scheduled for a PET Scan prior to procedure at [facility]. [name] stated that pt was instructed to take no insulin on morning of PET Scan. [name] made aware to have pt take 25 units of Levemir the night before surgery (Tuesday), and hold Tradjenta on DOS. [name] made aware to have pt check BG every 2 hours prior to arrival to hospital on DOS. Pt made aware to treat a BG < 70 with  4 ounces of apple or cranberry juice, wait 15 minutes after intervention to recheck BG, if BG remains < 70, call Short Stay unit to speak with a nurse. [name] made aware to have pt stop taking vitamins, fish oil, Sudafed PE and herbal medications. Do not take any NSAIDs ie: Ibuprofen, Advil, Naproxen (Aleve), Motrin, BC and Goody Powder. [name] stated that he will bring in Albuterol inhaler and Levemir insulin because pt's FBG tends to run high. [name] verbalized understanding of all pre-op instructions.

## 2018-07-05 NOTE — Telephone Encounter (Signed)
Nothing is needed at this time. Will close encounter.

## 2018-07-06 ENCOUNTER — Encounter (HOSPITAL_COMMUNITY): Payer: Self-pay | Admitting: Anesthesiology

## 2018-07-06 NOTE — Anesthesia Preprocedure Evaluation (Addendum)
Anesthesia Evaluation  Patient identified by MRN, date of birth, ID band Patient awake    Reviewed: Allergy & Precautions, NPO status , Patient's Chart, lab work & pertinent test results  Airway Mallampati: II  TM Distance: <3 FB Neck ROM: Full    Dental  (+) Edentulous Upper, Edentulous Lower   Pulmonary sleep apnea , former smoker,     + decreased breath sounds+ wheezing      Cardiovascular hypertension, Pt. on medications + CAD, + CABG and + Peripheral Vascular Disease  + dysrhythmias Atrial Fibrillation  Rhythm:Regular Rate:Normal     Neuro/Psych CVA    GI/Hepatic hiatal hernia, GERD  Medicated,  Endo/Other  diabetes, Type 2, Insulin Dependent  Renal/GU      Musculoskeletal   Abdominal (+) + obese,   Peds  Hematology   Anesthesia Other Findings   Reproductive/Obstetrics                           Lab Results  Component Value Date   WBC 11.8 (H) 07/02/2018   HGB 12.1 07/02/2018   HCT 41.5 07/02/2018   MCV 99.5 07/02/2018   PLT 145 (L) 07/02/2018   Lab Results  Component Value Date   CREATININE 1.00 07/02/2018   BUN 12 07/02/2018   NA 144 07/02/2018   K 3.6 07/02/2018   CL 101 07/02/2018   CO2 34 (H) 07/02/2018   Lab Results  Component Value Date   INR 1.48 05/30/2010   INR 1.13 05/29/2010   INR 1.10 05/28/2010   Echo: - Left ventricle: The cavity size was normal. There was mild focal   basal hypertrophy of the septum. Systolic function was normal.   The estimated ejection fraction was in the range of 60% to 65%.   Wall motion was normal; there were no regional wall motion   abnormalities. Doppler parameters are consistent with abnormal   left ventricular relaxation (grade 1 diastolic dysfunction). - Mitral valve: Calcified annulus. - Left atrium: The atrium was mildly dilated.  Anesthesia Physical Anesthesia Plan  ASA: III  Anesthesia Plan: General   Post-op Pain  Management:    Induction: Intravenous  PONV Risk Score and Plan: 4 or greater and Ondansetron, Dexamethasone, Midazolam and Treatment may vary due to age or medical condition  Airway Management Planned: Oral ETT  Additional Equipment: None  Intra-op Plan:   Post-operative Plan: Extubation in OR  Informed Consent:   Plan Discussed with: CRNA  Anesthesia Plan Comments: (See PAT note 07/06/2018 by Karoline Caldwell, PA-C  Albuterol breathing treatment administered in preop.)      Anesthesia Quick Evaluation

## 2018-07-06 NOTE — Progress Notes (Signed)
Anesthesia Chart Review: SAME DAY WORKUP   Case:  [id number] Date/Time:  07/07/18 1100   Procedure:  VIDEO BRONCHOSCOPY WITH ENDOBRONCHIAL ULTRASOUND (N/A )   Anesthesia type:  General   Pre-op diagnosis:  Mediastinal adenopathy   Location:  [facility] ROOM 10 / [facility] OR   Surgeon:  Garner Nash, DO      DISCUSSION: 72 yo female former smoker. Pertinent hx includes Paroxysmal A. fib, CAD s/p CABG x5 2011, CKD III, DMII, hypertension, hyperlipidemia, PAD, OSA not on CPAP, Continuous O2 use 2l via [place].  Pt was admitted 11/14-11/16/19 for Acute on chronic respiratory distress/hypoxia, treated for bronchitis. CT of the chest was consistent with primary pulmonary metastatic disease of unknown type. Echocardiogram showed ejection fraction 60 to 65% with grade 1 diastolic dysfunction. A diagnostic thoracentesis was performed which was therapeutic as well and has helped her dyspnea.  The  report shows no cancer cells. She has seen oncology since discharge from the hospital.  Oncologist  recommended either referral to pulmonology for possible bronchoscopy guided biopsy of the right upper lobe mass versus cardiothoracic surgery consultation. She has since been seen by pulmonology and scheduled for EBUS. She was instructed to stop Plavix 7d prior to procedure.    Per PCP note 06/25/2018 "At rest, she is 95% on 2 L. With ambulation, she quickly drops to 88% on room air and can only walk 15 or 20 feet before she must stop and rest."  Pt seen in ED 07/02/18 for SOB. Per Dr. Tyron Russell note "The patient is currently resting comfortably. CT scan shows chronic mass/metastatic disease. However there is a recurrent right pleural effusion and now a left pleural effusion. The patient does not appear to have any significant increased work of breathing or distress. No fevers. I do not think this is infected. I think she will need diuresis given the leg swelling that has been noticed. She is noted to have a troponin of 0.03, but  in this setting I doubt ACS. Likely from increased fluid." She was instructed to taker her home Lasix 1/d for next 5 days for diuresis.   Will need DOS eval by anesthesia. Anticipate she can proceed as planned barring acute status change.   VS: There were no vitals taken for this visit.  PROVIDERS: Susy Frizzle, MD is PCP  June Leap, DO is Pulmonologist  Curt Bears, MD is Oncologist  Quay Burow, MD is Cardiologist last seen 08/05/2017  LABS: CMP and CBC 07/02/2018 in Epic reviewed, no significant abnormalities noted. Positive troponin as discussed above in ED provider note felt to be due to fluid overload rather than ACS.  IMAGES: CTA Chest 07/02/2018: IMPRESSION: Stable changes including a 4.0 x 4.1 cm medial right upper lobe lung mass abutting the mediastinum, numerous pulmonary nodules/masses without significant change compatible with metastatic disease, small to moderate size right pleural effusion with associated basilar atelectasis and mediastinal/hilar adenopathy. New small to moderate size left pleural effusion with basilar atelectasis.  No evidence of pulmonary embolism.  Aortic Atherosclerosis (ICD10-I70.0). Atherosclerotic coronary artery disease.  Nephrolithiasis.  Mild nodular contour of the liver which may be due to cirrhosis.  EKG: 07/02/2018: Sinus rhythm. Rate 70. Ventricular premature complex. Borderline short PR interval. Low voltage, precordial leads  CV: TTE 06/18/2018: Study Conclusions  - Left ventricle: The cavity size was normal. There was mild focal   basal hypertrophy of the septum. Systolic function was normal.   The estimated ejection fraction was in the range of 60%  to 65%.   Wall motion was normal; there were no regional wall motion   abnormalities. Doppler parameters are consistent with abnormal   left ventricular relaxation (grade 1 diastolic dysfunction). - Mitral valve: Calcified annulus. - Left atrium: The  atrium was mildly dilated.  Impressions:  - Technically difficult; definity used; normal LV systolic   function; mild diastolic dysfunction; mild LAE.  Past Medical History:  Diagnosis Date  . Atrial fibrillation (HCC)    paroxysmal  . CAD (coronary artery disease)    X5  . Cancer ([place])    lung  . Chronic kidney disease   . Diabetes mellitus   . Gastritis   . GI bleeding   . Hiatal hernia   . Hyperglycemia   . Hyperlipidemia   . Hypertension   . Mediastinal adenopathy    and hilar  . Obesity   . Obstructive sleep apnea    does not wear CPAP  . Osteopenia   . Oxygen dependent   . Peripheral artery disease (HCC)    lower extremities  . Pneumonia   . Stroke ([place])   . Systolic murmur   . Ulcer   . Vitamin D deficiency   . Wears dentures     Past Surgical History:  Procedure Laterality Date  . ANGIOPLASTY / STENTING FEMORAL Right    leg  . BREAST SURGERY     biopsy  . Carotid doppler  10/13/2011   right ICA-49% diameter reduction and left ICA with 50%-69% diameter reduction  . CATARACT EXTRACTION W/ INTRAOCULAR LENS  IMPLANT, BILATERAL    . CHOLECYSTECTOMY    . COLONOSCOPY W/ BIOPSIES AND POLYPECTOMY    . CORONARY ARTERY BYPASS GRAFT  05/22/2010  . Heart bypass    . Myoview Stress  08/13/2010   inferolateral scar without ischemia    MEDICATIONS: No current facility-administered medications for this encounter.    [name] acetaminophen (TYLENOL) 325 MG tablet  . albuterol (PROVENTIL HFA;VENTOLIN HFA) 108 (90 Base) MCG/ACT inhaler  . ALPRAZolam (XANAX) 0.25 MG tablet  . ALPRAZolam (XANAX) 1 MG tablet  . ALPRAZolam (XANAX) 1 MG tablet  . amLODipine (NORVASC) 10 MG tablet  . aspirin 81 MG tablet  . B-D ULTRAFINE III SHORT PEN 31G X 8 MM MISC  . clopidogrel (PLAVIX) 75 MG tablet  . CVS VITAMIN D 2000 units CAPS  . furosemide (LASIX) 40 MG tablet  . hydrALAZINE (APRESOLINE) 25 MG tablet  . HYDROcodone-acetaminophen (NORCO) 5-325 MG tablet  . LEVEMIR FLEXTOUCH  100 UNIT/ML Pen  . lisinopril (PRINIVIL,ZESTRIL) 20 MG tablet  . metoCLOPramide (REGLAN) 5 MG tablet  . niacin (NIASPAN) 750 MG CR tablet  . pantoprazole (PROTONIX) 40 MG tablet  . phenylephrine (SUDAFED PE) 10 MG TABS tablet  . potassium chloride SA (K-DUR,KLOR-CON) 20 MEQ tablet  . promethazine (PHENERGAN) 25 MG tablet  . rosuvastatin (CRESTOR) 10 MG tablet  . silver sulfADIAZINE (SILVADENE) 1 % cream  . TRADJENTA 5 MG TABS tablet    Wynonia Musty [facility] Short Stay Center/Anesthesiology Phone [phone] 07/06/2018 11:00 AM

## 2018-07-07 ENCOUNTER — Other Ambulatory Visit: Payer: Self-pay | Admitting: Internal Medicine

## 2018-07-07 ENCOUNTER — Ambulatory Visit (HOSPITAL_COMMUNITY): Payer: Medicare Other | Admitting: Physician Assistant

## 2018-07-07 ENCOUNTER — Ambulatory Visit (HOSPITAL_BASED_OUTPATIENT_CLINIC_OR_DEPARTMENT_OTHER)
Admission: RE | Admit: 2018-07-07 | Discharge: 2018-07-07 | Disposition: A | Discharge status: Home / Self Care | Payer: Medicare Other | Source: Ambulatory Visit | Attending: Pulmonary Disease | Admitting: Pulmonary Disease

## 2018-07-07 ENCOUNTER — Other Ambulatory Visit: Payer: Self-pay

## 2018-07-07 ENCOUNTER — Encounter (HOSPITAL_COMMUNITY): Payer: Self-pay | Admitting: General Practice

## 2018-07-07 ENCOUNTER — Ambulatory Visit (HOSPITAL_COMMUNITY)
Admission: RE | Admit: 2018-07-07 | Discharge: 2018-07-07 | Disposition: A | Discharge status: Home / Self Care | Payer: Medicare Other | Source: Ambulatory Visit | Attending: Internal Medicine | Admitting: Internal Medicine

## 2018-07-07 ENCOUNTER — Encounter (HOSPITAL_COMMUNITY)
Admission: RE | Disposition: A | Discharge status: Home / Self Care | Payer: Self-pay | Source: Ambulatory Visit | Attending: Pulmonary Disease

## 2018-07-07 ENCOUNTER — Ambulatory Visit: Payer: Medicare Other | Admitting: Internal Medicine

## 2018-07-07 DIAGNOSIS — I2510 Atherosclerotic heart disease of native coronary artery without angina pectoris: Secondary | ICD-10-CM | POA: Diagnosis not present

## 2018-07-07 DIAGNOSIS — N179 Acute kidney failure, unspecified: Secondary | ICD-10-CM | POA: Diagnosis not present

## 2018-07-07 DIAGNOSIS — R918 Other nonspecific abnormal finding of lung field: Secondary | ICD-10-CM | POA: Diagnosis not present

## 2018-07-07 DIAGNOSIS — R05 Cough: Secondary | ICD-10-CM | POA: Diagnosis not present

## 2018-07-07 DIAGNOSIS — I5030 Unspecified diastolic (congestive) heart failure: Secondary | ICD-10-CM | POA: Diagnosis not present

## 2018-07-07 DIAGNOSIS — C801 Malignant (primary) neoplasm, unspecified: Secondary | ICD-10-CM | POA: Diagnosis not present

## 2018-07-07 DIAGNOSIS — R569 Unspecified convulsions: Secondary | ICD-10-CM | POA: Diagnosis not present

## 2018-07-07 DIAGNOSIS — C3411 Malignant neoplasm of upper lobe, right bronchus or lung: Secondary | ICD-10-CM | POA: Diagnosis not present

## 2018-07-07 DIAGNOSIS — G9340 Encephalopathy, unspecified: Secondary | ICD-10-CM | POA: Diagnosis not present

## 2018-07-07 DIAGNOSIS — R911 Solitary pulmonary nodule: Secondary | ICD-10-CM | POA: Diagnosis not present

## 2018-07-07 DIAGNOSIS — N183 Chronic kidney disease, stage 3 (moderate): Secondary | ICD-10-CM | POA: Diagnosis not present

## 2018-07-07 DIAGNOSIS — R51 Headache: Principal | ICD-10-CM

## 2018-07-07 DIAGNOSIS — I129 Hypertensive chronic kidney disease with stage 1 through stage 4 chronic kidney disease, or unspecified chronic kidney disease: Secondary | ICD-10-CM | POA: Diagnosis not present

## 2018-07-07 DIAGNOSIS — C771 Secondary and unspecified malignant neoplasm of intrathoracic lymph nodes: Secondary | ICD-10-CM | POA: Diagnosis not present

## 2018-07-07 DIAGNOSIS — I130 Hypertensive heart and chronic kidney disease with heart failure and stage 1 through stage 4 chronic kidney disease, or unspecified chronic kidney disease: Secondary | ICD-10-CM | POA: Diagnosis not present

## 2018-07-07 DIAGNOSIS — Z9981 Dependence on supplemental oxygen: Secondary | ICD-10-CM | POA: Diagnosis not present

## 2018-07-07 DIAGNOSIS — D61818 Other pancytopenia: Secondary | ICD-10-CM | POA: Diagnosis not present

## 2018-07-07 DIAGNOSIS — J9611 Chronic respiratory failure with hypoxia: Secondary | ICD-10-CM | POA: Diagnosis not present

## 2018-07-07 DIAGNOSIS — C7931 Secondary malignant neoplasm of brain: Secondary | ICD-10-CM | POA: Diagnosis not present

## 2018-07-07 DIAGNOSIS — I10 Essential (primary) hypertension: Secondary | ICD-10-CM | POA: Diagnosis not present

## 2018-07-07 DIAGNOSIS — C3490 Malignant neoplasm of unspecified part of unspecified bronchus or lung: Secondary | ICD-10-CM

## 2018-07-07 DIAGNOSIS — R846 Abnormal cytological findings in specimens from respiratory organs and thorax: Secondary | ICD-10-CM | POA: Diagnosis not present

## 2018-07-07 DIAGNOSIS — E440 Moderate protein-calorie malnutrition: Secondary | ICD-10-CM | POA: Diagnosis not present

## 2018-07-07 DIAGNOSIS — G4089 Other seizures: Secondary | ICD-10-CM | POA: Diagnosis not present

## 2018-07-07 DIAGNOSIS — R519 Headache, unspecified: Secondary | ICD-10-CM

## 2018-07-07 DIAGNOSIS — Z6841 Body Mass Index (BMI) 40.0 and over, adult: Secondary | ICD-10-CM | POA: Diagnosis not present

## 2018-07-07 DIAGNOSIS — R590 Localized enlarged lymph nodes: Secondary | ICD-10-CM | POA: Diagnosis not present

## 2018-07-07 HISTORY — PX: VIDEO BRONCHOSCOPY WITH ENDOBRONCHIAL ULTRASOUND: SHX6177

## 2018-07-07 HISTORY — DX: Dependence on supplemental oxygen: Z99.81

## 2018-07-07 HISTORY — DX: Malignant (primary) neoplasm, unspecified: C80.1

## 2018-07-07 HISTORY — DX: Presence of dental prosthetic device (complete) (partial): Z97.2

## 2018-07-07 HISTORY — DX: Pneumonia, unspecified organism: J18.9

## 2018-07-07 HISTORY — DX: Localized enlarged lymph nodes: R59.0

## 2018-07-07 LAB — PROTIME-INR
INR: 1.02
Prothrombin Time: 13.3 seconds (ref 11.4–15.2)

## 2018-07-07 LAB — GLUCOSE, CAPILLARY
Glucose-Capillary: 97 mg/dL (ref 70–99)
Glucose-Capillary: 150 mg/dL — ABNORMAL HIGH (ref 70–99)
Glucose-Capillary: 64 mg/dL — ABNORMAL LOW (ref 70–99)
Glucose-Capillary: 83 mg/dL (ref 70–99)
Glucose-Capillary: 69 mg/dL — ABNORMAL LOW (ref 70–99)
Glucose-Capillary: 130 mg/dL — ABNORMAL HIGH (ref 70–99)

## 2018-07-07 LAB — APTT: aPTT: 31 seconds (ref 24–36)

## 2018-07-07 SURGERY — BRONCHOSCOPY, WITH EBUS
Anesthesia: General

## 2018-07-07 MED ORDER — ALBUTEROL SULFATE (2.5 MG/3ML) 0.083% IN NEBU
INHALATION_SOLUTION | RESPIRATORY_TRACT | Status: AC
  Administered 2018-07-07: 2.5 mg via RESPIRATORY_TRACT
  Filled 2018-07-07: qty 3

## 2018-07-07 MED ORDER — SUCCINYLCHOLINE CHLORIDE 20 MG/ML IJ SOLN
INTRAMUSCULAR | Status: DC | PRN
  Administered 2018-07-07: 100 mg via INTRAVENOUS

## 2018-07-07 MED ORDER — LIDOCAINE 2% (20 MG/ML) 5 ML SYRINGE
INTRAMUSCULAR | Status: DC | PRN
  Administered 2018-07-07: 40 mg via INTRAVENOUS

## 2018-07-07 MED ORDER — ESMOLOL HCL 100 MG/10ML IV SOLN
INTRAVENOUS | Status: AC
  Filled 2018-07-07: qty 10

## 2018-07-07 MED ORDER — DEXTROSE 50 % IV SOLN
25.0000 mL | Freq: Once | INTRAVENOUS | Status: AC
  Administered 2018-07-07: 25 mL via INTRAVENOUS

## 2018-07-07 MED ORDER — FENTANYL CITRATE (PF) 100 MCG/2ML IJ SOLN: 25.0000 ug | INTRAMUSCULAR | Status: DC | PRN

## 2018-07-07 MED ORDER — ALBUTEROL SULFATE (2.5 MG/3ML) 0.083% IN NEBU
2.5000 mg | INHALATION_SOLUTION | Freq: Once | RESPIRATORY_TRACT | Status: AC
  Administered 2018-07-07: 2.5 mg via RESPIRATORY_TRACT

## 2018-07-07 MED ORDER — GLYCOPYRROLATE PF 0.2 MG/ML IJ SOSY
PREFILLED_SYRINGE | INTRAMUSCULAR | Status: AC
  Filled 2018-07-07: qty 1

## 2018-07-07 MED ORDER — FLUDEOXYGLUCOSE F - 18 (FDG) INJECTION
10.1000 | Freq: Once | INTRAVENOUS | Status: AC | PRN
  Administered 2018-07-07: 10.1 via INTRAVENOUS

## 2018-07-07 MED ORDER — ROCURONIUM BROMIDE 10 MG/ML (PF) SYRINGE
PREFILLED_SYRINGE | INTRAVENOUS | Status: DC | PRN
  Administered 2018-07-07: 10 mg via INTRAVENOUS

## 2018-07-07 MED ORDER — PROMETHAZINE HCL 25 MG/ML IJ SOLN: 6.2500 mg | INTRAMUSCULAR | Status: DC | PRN

## 2018-07-07 MED ORDER — SODIUM CHLORIDE 0.9 % IV SOLN
INTRAVENOUS | Status: DC | PRN
  Administered 2018-07-07: 70 ug/min via INTRAVENOUS

## 2018-07-07 MED ORDER — FENTANYL CITRATE (PF) 250 MCG/5ML IJ SOLN
INTRAMUSCULAR | Status: AC
  Filled 2018-07-07: qty 5

## 2018-07-07 MED ORDER — ONDANSETRON HCL 4 MG/2ML IJ SOLN
INTRAMUSCULAR | Status: AC
  Filled 2018-07-07: qty 6

## 2018-07-07 MED ORDER — MEPERIDINE HCL 50 MG/ML IJ SOLN: 6.2500 mg | INTRAMUSCULAR | Status: DC | PRN

## 2018-07-07 MED ORDER — DEXTROSE 50 % IV SOLN
INTRAVENOUS | Status: AC
  Administered 2018-07-07: 25 mL via INTRAVENOUS
  Filled 2018-07-07: qty 50

## 2018-07-07 MED ORDER — LACTATED RINGERS IV SOLN: INTRAVENOUS | Status: DC

## 2018-07-07 MED ORDER — DEXAMETHASONE SODIUM PHOSPHATE 10 MG/ML IJ SOLN
INTRAMUSCULAR | Status: DC | PRN
  Administered 2018-07-07: 10 mg via INTRAVENOUS

## 2018-07-07 MED ORDER — 0.9 % SODIUM CHLORIDE (POUR BTL) OPTIME
TOPICAL | Status: DC | PRN
  Administered 2018-07-07: 1000 mL

## 2018-07-07 MED ORDER — ONDANSETRON HCL 4 MG/2ML IJ SOLN
INTRAMUSCULAR | Status: DC | PRN
  Administered 2018-07-07: 4 mg via INTRAVENOUS

## 2018-07-07 MED ORDER — DEXTROSE 50 % IV SOLN: 50.0000 mL | Freq: Once | INTRAVENOUS | Status: DC

## 2018-07-07 MED ORDER — PROPOFOL 10 MG/ML IV BOLUS
INTRAVENOUS | Status: AC
  Filled 2018-07-07: qty 60

## 2018-07-07 MED ORDER — EPINEPHRINE PF 1 MG/ML IJ SOLN
INTRAMUSCULAR | Status: AC
  Filled 2018-07-07: qty 1

## 2018-07-07 MED ORDER — DEXTROSE 50 % IV SOLN
25.0000 mL | Freq: Once | INTRAVENOUS | Status: AC
  Administered 2018-07-07: 25 mL via INTRAVENOUS
  Filled 2018-07-07: qty 50

## 2018-07-07 MED ORDER — DEXAMETHASONE SODIUM PHOSPHATE 10 MG/ML IJ SOLN
INTRAMUSCULAR | Status: AC
  Filled 2018-07-07: qty 3

## 2018-07-07 MED ORDER — EPHEDRINE SULFATE-NACL 50-0.9 MG/10ML-% IV SOSY
PREFILLED_SYRINGE | INTRAVENOUS | Status: DC | PRN
  Administered 2018-07-07: 10 mg via INTRAVENOUS

## 2018-07-07 MED ORDER — PROPOFOL 10 MG/ML IV BOLUS
INTRAVENOUS | Status: DC | PRN
  Administered 2018-07-07: 110 mg via INTRAVENOUS

## 2018-07-07 MED ORDER — LACTATED RINGERS IV SOLN
INTRAVENOUS | Status: DC
  Administered 2018-07-07: 10:00:00 via INTRAVENOUS

## 2018-07-07 MED ORDER — SUGAMMADEX SODIUM 200 MG/2ML IV SOLN
INTRAVENOUS | Status: DC | PRN
  Administered 2018-07-07: 200 mg via INTRAVENOUS

## 2018-07-07 SURGICAL SUPPLY — 35 items
ADAPTER VALVE BIOPSY EBUS (MISCELLANEOUS) IMPLANT
ADPTR VALVE BIOPSY EBUS (MISCELLANEOUS) ×6
BRUSH CYTOL CELLEBRITY 1.5X140 (MISCELLANEOUS) IMPLANT
CANISTER SUCT 3000ML PPV (MISCELLANEOUS) ×3 IMPLANT
CONT SPEC 4OZ CLIKSEAL STRL BL (MISCELLANEOUS) ×3 IMPLANT
COVER BACK TABLE 60X90IN (DRAPES) ×3 IMPLANT
FILTER STRAW FLUID ASPIR (MISCELLANEOUS) IMPLANT
FORCEPS BIOP RJ4 1.8 (CUTTING FORCEPS) IMPLANT
GAUZE SPONGE 4X4 12PLY STRL (GAUZE/BANDAGES/DRESSINGS) ×3 IMPLANT
GLOVE SURG SS PI 7.5 STRL IVOR (GLOVE) ×3 IMPLANT
GOWN STRL REUS W/ TWL LRG LVL3 (GOWN DISPOSABLE) ×2 IMPLANT
GOWN STRL REUS W/TWL LRG LVL3 (GOWN DISPOSABLE) ×6
KIT CLEAN ENDO COMPLIANCE (KITS) ×6 IMPLANT
KIT TURNOVER KIT B (KITS) ×3 IMPLANT
MARKER SKIN DUAL TIP RULER LAB (MISCELLANEOUS) ×3 IMPLANT
NDL ASPIRATION VIZISHOT 19G (NEEDLE) IMPLANT
NDL ASPIRATION VIZISHOT 21G (NEEDLE) ×1 IMPLANT
NEEDLE ASPIRATION VIZISHOT 19G (NEEDLE) ×3 IMPLANT
NEEDLE ASPIRATION VIZISHOT 21G (NEEDLE) IMPLANT
NS IRRIG 1000ML POUR BTL (IV SOLUTION) ×3 IMPLANT
OIL SILICONE PENTAX (PARTS (SERVICE/REPAIRS)) ×3 IMPLANT
PAD ARMBOARD 7.5X6 YLW CONV (MISCELLANEOUS) ×6 IMPLANT
STOPCOCK 4 WAY LG BORE MALE ST (IV SETS) ×3 IMPLANT
SYR 20CC LL (SYRINGE) ×3 IMPLANT
SYR 20ML ECCENTRIC (SYRINGE) ×9 IMPLANT
SYR 3ML LL SCALE MARK (SYRINGE) IMPLANT
SYR 50ML SLIP (SYRINGE) ×1 IMPLANT
SYR 5ML LL (SYRINGE) ×3 IMPLANT
TRAP SPECIMEN MUCOUS 40CC (MISCELLANEOUS) IMPLANT
TUBE CONNECTING 20'X1/4 (TUBING) ×1
TUBE CONNECTING 20X1/4 (TUBING) ×2 IMPLANT
VALVE BIOPSY  SINGLE USE (MISCELLANEOUS) ×2
VALVE BIOPSY SINGLE USE (MISCELLANEOUS) ×1 IMPLANT
VALVE SUCTION BRONCHIO DISP (MISCELLANEOUS) ×3 IMPLANT
WATER STERILE IRR 1000ML POUR (IV SOLUTION) ×3 IMPLANT

## 2018-07-07 NOTE — Anesthesia Procedure Notes (Addendum)
Procedure Name: Intubation Date/Time: 07/07/2018 11:27 AM Performed by: Cleda Daub, CRNA Pre-anesthesia Checklist: Emergency Drugs available, Patient identified, Suction available and Patient being monitored Patient Re-evaluated:Patient Re-evaluated prior to induction Oxygen Delivery Method: Circle system utilized Preoxygenation: Pre-oxygenation with 100% oxygen Induction Type: IV induction Laryngoscope Size: Mac and 3 Grade View: Grade II Tube type: Oral Tube size: 8.5 mm Number of attempts: 1 Airway Equipment and Method: Stylet Placement Confirmation: ETT inserted through vocal cords under direct vision,  positive ETCO2 and breath sounds checked- equal and bilateral Secured at: 21 cm Tube secured with: Tape Dental Injury: Teeth and Oropharynx as per pre-operative assessment

## 2018-07-07 NOTE — Op Note (Addendum)
Video Bronchoscopy with Endobronchial Ultrasound Procedure Note  Date of Operation: 07/07/2018  Pre-op Diagnosis: Multiple pulmonary nodules, mediastinal and hilar adenopathy  Post-op Diagnosis: Multiple pulmonary nodules, mediastinal and hilar adenopathy  Surgeon: Garner Nash, DO   Assistants: None  Anesthesia: General endotracheal anesthesia  Operation: Flexible video fiberoptic bronchoscopy with endobronchial ultrasound and biopsies.  Estimated Blood Loss: Minimal, less than 1 cc  Complications: None  Indications and History: [name] is a 72 y.o. female with multiple innumerable bilateral pulmonary nodules concerning for metastatic disease with mediastinal and hilar adenopathy of unknown primary. The risks, benefits, complications, treatment options and expected outcomes were discussed with the patient.  The possibilities of pneumothorax, pneumonia, reaction to medication, pulmonary aspiration, perforation of a viscus, bleeding, failure to diagnose a condition and creating a complication requiring transfusion or operation were discussed with the patient who freely signed the consent.    Description of Procedure: The patient was examined in the preoperative area and history and data from the preprocedure consultation were reviewed. It was deemed appropriate to proceed.  The patient was taken to [facility] OR 10, identified as [name] and the procedure verified as Flexible Video Fiberoptic Bronchoscopy with use of endobronchial ultrasound-guided transbronchial needle aspiration biopsies.  A Time Out was held and the above information confirmed. After being taken to the operating room general anesthesia was initiated and the patient  was orally intubated. The video fiberoptic bronchoscope was introduced via the endotracheal tube and a general inspection was performed which showed scattered bronchial pitting with few distal web formations.  Extrinsic compression of the right lower  lobe superior segment.  Bilateral thick secretions with lower lobe mucus plugging in the bilateral lower lobes.  Standard bronchoscope was used to suction and clean the airway of all thick mucous plugs secretions.  There was no evidence of endobronchial lesion.. The standard scope was then withdrawn and the endobronchial ultrasound was used to identify and characterize the peritracheal, hilar and bronchial lymph nodes. Inspection showed enlarged lymph nodes within both bilateral hila, subcarinal and pretracheal locations. Using real-time ultrasound guidance transbronchial needle aspiration biopsies were take from Station 7 node and were sent for cytology.  We completed a total of 7 passes within the lymph node for were sent for slides and 3 into cell block. The patient tolerated the procedure well without apparent complications. There was no significant blood loss. The bronchoscope was withdrawn.  The standard scope was reinserted into the airway there was no evidence of active bleeding.  I wedged the bronchoscope into the right middle lobe and completed a BAL with 120 cc of saline and moderate return.  This to be sent for cytology.  The scope was used to suction any bloody secretions within the right mainstem.  The scope was brought just above the main carina there was no evidence of active bleeding. Anesthesia was reversed and the patient was taken to the PACU for recovery.   Samples: 1. Station 7 transbronchial needle aspiration biopsies x7, 4 for slides, 3 for cell block 2. BAL RML 120cc with moderate return  Preliminary pathology: Adequate for malignancy  Plans:  The patient will be discharged from the PACU to home when recovered from anesthesia. We will review the cytology, pathology and microbiology results with the patient when they become available. Outpatient followup will be with myself, Octavio Graves Annete Ayuso, DO.   We will also ensure follow-up with medical oncology, Dr. Earlie Server.  Garner Nash, DO [facility] 07/07/2018 12:15 PM  Personal pager: [phone] If unanswered, please page CCM On-call: [phone]

## 2018-07-07 NOTE — Addendum Note (Signed)
Addendum  created 07/07/18 1747 by Cleda Daub, CRNA   Intraprocedure Blocks edited, Sign clinical note

## 2018-07-07 NOTE — Transfer of Care (Signed)
Immediate Anesthesia Transfer of Care Note  Patient: [name]  Procedure(s) Performed: VIDEO BRONCHOSCOPY WITH ENDOBRONCHIAL ULTRASOUND (N/A )  Patient Location: PACU  Anesthesia Type:General  Level of Consciousness: awake, alert , oriented and patient cooperative  Airway & Oxygen Therapy: Patient Spontanous Breathing and Patient connected to face mask oxygen  Post-op Assessment: Report given to RN and Post -op Vital signs reviewed and stable  Post vital signs: Reviewed and stable  Last Vitals:  Vitals Value Taken Time  BP 151/50 07/07/2018 12:30 PM  Temp 36.5 C 07/07/2018 12:30 PM  Pulse 83 07/07/2018 12:31 PM  Resp 16 07/07/2018 12:31 PM  SpO2 90 % 07/07/2018 12:31 PM  Vitals shown include unvalidated device data.  Last Pain:  Vitals:   07/07/18 0957  TempSrc:   PainSc: 0-No pain      Patients Stated Pain Goal: 3 (28/83/37 4451)  Complications: No apparent anesthesia complications

## 2018-07-07 NOTE — Progress Notes (Addendum)
Hypoglycemic Event  CBG: 69  Treatment: 25 mL of d50  Symptoms: none  Follow-up CBG: Time: 1300 CBG Result: 130  Possible Reasons for Event: NPO  Comments/MD notified:  Dr. Smith Robert notified   [name]

## 2018-07-07 NOTE — Interval H&P Note (Signed)
History and Physical Interval Note:  07/07/2018 11:03 AM  [name]  has presented today for surgery, with the diagnosis of Mediastinal adenopathy  The various methods of treatment have been discussed with the patient and family. After consideration of risks, benefits and other options for treatment, the patient has consented to  Procedure(s): [place] (N/A) as a surgical intervention .  The patient's history has been reviewed, patient examined, no change in status, stable for surgery.  I have reviewed the patient's chart and labs.  Questions were answered to the patient's satisfaction.    Plavix held since last Tuesday per patient and family. Patient seen and examined in pre-op. All questions answered. Consent obtained. Family at bedside. No barriers to proceed.   [place]

## 2018-07-07 NOTE — Anesthesia Postprocedure Evaluation (Signed)
Anesthesia Post Note  Patient: [name]  Procedure(s) Performed: VIDEO BRONCHOSCOPY WITH ENDOBRONCHIAL ULTRASOUND (N/A )     Patient location during evaluation: PACU Anesthesia Type: General Level of consciousness: awake and alert Pain management: pain level controlled Vital Signs Assessment: post-procedure vital signs reviewed and stable Respiratory status: spontaneous breathing, nonlabored ventilation, respiratory function stable and patient connected to nasal cannula oxygen Cardiovascular status: blood pressure returned to baseline and stable Postop Assessment: no apparent nausea or vomiting Anesthetic complications: no    Last Vitals:  Vitals:   07/07/18 1345 07/07/18 1346  BP: (!) 144/52 (!) 146/49  Pulse: 84 84  Resp: (!) 21 20  Temp: 36.5 C 36.4 C  SpO2: 95% 95%    Last Pain:  Vitals:   07/07/18 0957  TempSrc:   PainSc: 0-No pain                 Effie Berkshire

## 2018-07-07 NOTE — Discharge Instructions (Signed)
Flexible Bronchoscopy, Care After This sheet gives you information about how to care for yourself after your procedure. Your health care provider may also give you more specific instructions. If you have problems or questions, contact your health care provider. What can I expect after the procedure? After the procedure, it is common to have the following symptoms for 24-48 hours:  A cough that is worse than it was before the procedure.  A low-grade fever.  A sore throat or hoarse voice.  Small streaks of blood in the mucus from your lungs (sputum), if tissue samples were removed (biopsy).  Follow these instructions at home: Eating and drinking  The day after the procedure, return to your normal diet. Driving  Do not drive for 24 hours if you were given a medicine to help you relax (sedative).  Do not drive or use heavy machinery while taking prescription pain medicine. General instructions  Take over-the-counter and prescription medicines only as told by your health care provider.  Return to your normal activities as told by your health care provider. Ask your health care provider what activities are safe for you.  Do not use any products that contain nicotine or tobacco, such as cigarettes and e-cigarettes. If you need help quitting, ask your health care provider.  Keep all follow-up visits as told by your health care provider. This is important, especially if you had a biopsy taken. Get help right away if:  You have shortness of breath that gets worse.  You become light-headed or feel like you might faint.  You have chest pain.  You cough up more than a small amount of blood.  The amount of blood you cough up increases. Summary  Common symptoms in the 24-48 hours following a flexible bronchoscopy include cough, low-grade fever, sore throat or hoarse voice, and blood-streaked mucus from the lungs (if you had a biopsy).  Do not eat or drink anything (including water) for  2 hours after your procedure, or until your local anesthetic has worn off. You can return to your normal diet the day after the procedure.  Get help right away if you develop worsening shortness of breath, have chest pain, become light-headed, or cough up more than a small amount of blood. This information is not intended to replace advice given to you by your health care provider. Make sure you discuss any questions you have with your health care provider.  Document Released: 02/07/2005 Document Revised: 08/08/2016 Document Reviewed: 08/08/2016 Elsevier Interactive Patient Education  2017 [employer].

## 2018-07-08 ENCOUNTER — Other Ambulatory Visit: Payer: Self-pay

## 2018-07-08 ENCOUNTER — Emergency Department (HOSPITAL_COMMUNITY): Payer: Medicare Other

## 2018-07-08 ENCOUNTER — Other Ambulatory Visit: Payer: Self-pay | Admitting: *Deleted

## 2018-07-08 ENCOUNTER — Encounter (HOSPITAL_COMMUNITY): Payer: Self-pay | Admitting: Pulmonary Disease

## 2018-07-08 ENCOUNTER — Inpatient Hospital Stay (HOSPITAL_COMMUNITY)
Admission: EM | Admit: 2018-07-08 | Discharge: 2018-07-16 | DRG: 055 | Disposition: A | Discharge status: Hospice | Payer: Medicare Other | Attending: Internal Medicine | Admitting: Internal Medicine

## 2018-07-08 DIAGNOSIS — I480 Paroxysmal atrial fibrillation: Secondary | ICD-10-CM | POA: Diagnosis present

## 2018-07-08 DIAGNOSIS — Z8249 Family history of ischemic heart disease and other diseases of the circulatory system: Secondary | ICD-10-CM

## 2018-07-08 DIAGNOSIS — Z9049 Acquired absence of other specified parts of digestive tract: Secondary | ICD-10-CM

## 2018-07-08 DIAGNOSIS — Z794 Long term (current) use of insulin: Secondary | ICD-10-CM

## 2018-07-08 DIAGNOSIS — R05 Cough: Secondary | ICD-10-CM

## 2018-07-08 DIAGNOSIS — Z515 Encounter for palliative care: Secondary | ICD-10-CM

## 2018-07-08 DIAGNOSIS — C7931 Secondary malignant neoplasm of brain: Principal | ICD-10-CM

## 2018-07-08 DIAGNOSIS — C3490 Malignant neoplasm of unspecified part of unspecified bronchus or lung: Secondary | ICD-10-CM

## 2018-07-08 DIAGNOSIS — E1143 Type 2 diabetes mellitus with diabetic autonomic (poly)neuropathy: Secondary | ICD-10-CM | POA: Diagnosis present

## 2018-07-08 DIAGNOSIS — Z6841 Body Mass Index (BMI) 40.0 and over, adult: Secondary | ICD-10-CM

## 2018-07-08 DIAGNOSIS — I959 Hypotension, unspecified: Secondary | ICD-10-CM | POA: Diagnosis not present

## 2018-07-08 DIAGNOSIS — Z79899 Other long term (current) drug therapy: Secondary | ICD-10-CM

## 2018-07-08 DIAGNOSIS — Z87891 Personal history of nicotine dependence: Secondary | ICD-10-CM

## 2018-07-08 DIAGNOSIS — C3411 Malignant neoplasm of upper lobe, right bronchus or lung: Secondary | ICD-10-CM | POA: Diagnosis present

## 2018-07-08 DIAGNOSIS — W19XXXA Unspecified fall, initial encounter: Secondary | ICD-10-CM | POA: Diagnosis not present

## 2018-07-08 DIAGNOSIS — R059 Cough, unspecified: Secondary | ICD-10-CM

## 2018-07-08 DIAGNOSIS — D72829 Elevated white blood cell count, unspecified: Secondary | ICD-10-CM | POA: Diagnosis present

## 2018-07-08 DIAGNOSIS — I130 Hypertensive heart and chronic kidney disease with heart failure and stage 1 through stage 4 chronic kidney disease, or unspecified chronic kidney disease: Secondary | ICD-10-CM | POA: Diagnosis present

## 2018-07-08 DIAGNOSIS — Z9981 Dependence on supplemental oxygen: Secondary | ICD-10-CM

## 2018-07-08 DIAGNOSIS — H53462 Homonymous bilateral field defects, left side: Secondary | ICD-10-CM | POA: Diagnosis present

## 2018-07-08 DIAGNOSIS — R627 Adult failure to thrive: Secondary | ICD-10-CM | POA: Diagnosis present

## 2018-07-08 DIAGNOSIS — G4089 Other seizures: Secondary | ICD-10-CM | POA: Diagnosis present

## 2018-07-08 DIAGNOSIS — E440 Moderate protein-calorie malnutrition: Secondary | ICD-10-CM | POA: Diagnosis present

## 2018-07-08 DIAGNOSIS — Z66 Do not resuscitate: Secondary | ICD-10-CM | POA: Diagnosis present

## 2018-07-08 DIAGNOSIS — R569 Unspecified convulsions: Secondary | ICD-10-CM

## 2018-07-08 DIAGNOSIS — T383X5A Adverse effect of insulin and oral hypoglycemic [antidiabetic] drugs, initial encounter: Secondary | ICD-10-CM | POA: Diagnosis present

## 2018-07-08 DIAGNOSIS — E160 Drug-induced hypoglycemia without coma: Secondary | ICD-10-CM | POA: Diagnosis present

## 2018-07-08 DIAGNOSIS — I69398 Other sequelae of cerebral infarction: Secondary | ICD-10-CM

## 2018-07-08 DIAGNOSIS — E875 Hyperkalemia: Secondary | ICD-10-CM | POA: Diagnosis present

## 2018-07-08 DIAGNOSIS — Z833 Family history of diabetes mellitus: Secondary | ICD-10-CM

## 2018-07-08 DIAGNOSIS — G9340 Encephalopathy, unspecified: Secondary | ICD-10-CM | POA: Diagnosis not present

## 2018-07-08 DIAGNOSIS — E11649 Type 2 diabetes mellitus with hypoglycemia without coma: Secondary | ICD-10-CM | POA: Diagnosis present

## 2018-07-08 DIAGNOSIS — N183 Chronic kidney disease, stage 3 (moderate): Secondary | ICD-10-CM | POA: Diagnosis present

## 2018-07-08 DIAGNOSIS — Z7982 Long term (current) use of aspirin: Secondary | ICD-10-CM

## 2018-07-08 DIAGNOSIS — I491 Atrial premature depolarization: Secondary | ICD-10-CM | POA: Diagnosis not present

## 2018-07-08 DIAGNOSIS — R0902 Hypoxemia: Secondary | ICD-10-CM | POA: Diagnosis not present

## 2018-07-08 DIAGNOSIS — E785 Hyperlipidemia, unspecified: Secondary | ICD-10-CM | POA: Diagnosis present

## 2018-07-08 DIAGNOSIS — J9611 Chronic respiratory failure with hypoxia: Secondary | ICD-10-CM | POA: Diagnosis present

## 2018-07-08 DIAGNOSIS — I5030 Unspecified diastolic (congestive) heart failure: Secondary | ICD-10-CM | POA: Diagnosis present

## 2018-07-08 DIAGNOSIS — I10 Essential (primary) hypertension: Secondary | ICD-10-CM | POA: Diagnosis not present

## 2018-07-08 DIAGNOSIS — Z7902 Long term (current) use of antithrombotics/antiplatelets: Secondary | ICD-10-CM

## 2018-07-08 DIAGNOSIS — I2510 Atherosclerotic heart disease of native coronary artery without angina pectoris: Secondary | ICD-10-CM | POA: Diagnosis present

## 2018-07-08 DIAGNOSIS — J449 Chronic obstructive pulmonary disease, unspecified: Secondary | ICD-10-CM | POA: Diagnosis present

## 2018-07-08 DIAGNOSIS — E1151 Type 2 diabetes mellitus with diabetic peripheral angiopathy without gangrene: Secondary | ICD-10-CM | POA: Diagnosis present

## 2018-07-08 DIAGNOSIS — Z9861 Coronary angioplasty status: Secondary | ICD-10-CM

## 2018-07-08 DIAGNOSIS — E1122 Type 2 diabetes mellitus with diabetic chronic kidney disease: Secondary | ICD-10-CM | POA: Diagnosis present

## 2018-07-08 DIAGNOSIS — N179 Acute kidney failure, unspecified: Secondary | ICD-10-CM | POA: Diagnosis present

## 2018-07-08 DIAGNOSIS — K3184 Gastroparesis: Secondary | ICD-10-CM | POA: Diagnosis present

## 2018-07-08 DIAGNOSIS — K219 Gastro-esophageal reflux disease without esophagitis: Secondary | ICD-10-CM | POA: Diagnosis present

## 2018-07-08 DIAGNOSIS — D61818 Other pancytopenia: Secondary | ICD-10-CM | POA: Diagnosis present

## 2018-07-08 DIAGNOSIS — Z951 Presence of aortocoronary bypass graft: Secondary | ICD-10-CM

## 2018-07-08 LAB — I-STAT TROPONIN, ED: Troponin i, poc: 0.06 ng/mL (ref 0.00–0.08)

## 2018-07-08 LAB — I-STAT CHEM 8, ED
BUN: 37 mg/dL — ABNORMAL HIGH (ref 8–23)
Calcium, Ion: 1.07 mmol/L — ABNORMAL LOW (ref 1.15–1.40)
Chloride: 93 mmol/L — ABNORMAL LOW (ref 98–111)
Creatinine, Ser: 1.2 mg/dL — ABNORMAL HIGH (ref 0.44–1.00)
Glucose, Bld: 96 mg/dL (ref 70–99)
HCT: 37 % (ref 36.0–46.0)
HEMOGLOBIN: 12.6 g/dL (ref 12.0–15.0)
Potassium: 4.7 mmol/L (ref 3.5–5.1)
Sodium: 140 mmol/L (ref 135–145)
TCO2: 41 mmol/L — ABNORMAL HIGH (ref 22–32)

## 2018-07-08 MED ORDER — SODIUM CHLORIDE 0.9 % IV BOLUS
500.0000 mL | Freq: Once | INTRAVENOUS | Status: AC
  Administered 2018-07-09: 500 mL via INTRAVENOUS

## 2018-07-08 MED ORDER — SODIUM CHLORIDE 0.9 % IV SOLN
100.0000 mL/h | INTRAVENOUS | Status: DC
  Administered 2018-07-08 – 2018-07-09 (×2): 100 mL/h via INTRAVENOUS

## 2018-07-08 NOTE — ED Triage Notes (Signed)
Pt BIB [facility] for seizure like activity. Pts roommmate noticed she had fallen asleep in the recliner and then started having a "fit". Room mate thought it was a nightmare but then turned into seizure like activity. EMS described something like a post tical state. Pt A+Ox4 in triage. Wears 3L [place] all times at home. 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

## 2018-07-08 NOTE — ED Provider Notes (Addendum)
[facility] EMERGENCY DEPARTMENT Provider Note  CSN: [id number] Arrival date & time: 07/08/18 2253  Chief Complaint(s) Seizures  HPI [name] is a 72 y.o. female with extensive past medical history listed below including atrial fibrillation, metastatic lung cancer that is newly diagnosed who presents to the emergency department for seizure-like activity.  Patient lives at home with a roommate who reported that around 29 30-9 80 PM the patient was sitting in a chair sleeping.  He noted that she began grunting and then started having convulsions.  He is unsure of how long this lasted but approximately 10 to 12 minutes.  Patient called 911 who sent EMS.  He also checked her CBG and it was 146.  Awoke and was confused.  When EMS arrived they noted likely postictal state.  Patient also did report that she had difficulty remembering her family's phone numbers earlier today.  This started approximately at 3 PM.  States that this is highly atypical for her.  Since arrival here the patient's memory has returned and is able to recall the numbers.  HPI  Past Medical History Past Medical History:  Diagnosis Date  . Atrial fibrillation (HCC)    paroxysmal  . CAD (coronary artery disease)    X5  . Cancer ([place])    lung  . Chronic kidney disease   . Diabetes mellitus   . Gastritis   . GI bleeding   . Hiatal hernia   . Hyperglycemia   . Hyperlipidemia   . Hypertension   . Mediastinal adenopathy    and hilar  . Obesity   . Obstructive sleep apnea    does not wear CPAP  . Osteopenia   . Oxygen dependent   . Peripheral artery disease (HCC)    lower extremities  . Pneumonia   . Stroke ([place])   . Systolic murmur   . Ulcer   . Vitamin D deficiency   . Wears dentures    Patient Active Problem List   Diagnosis Date Noted  . Mediastinal adenopathy 07/07/2018  . Pleural effusion on right 06/28/2018  . Pulmonary nodules/lesions, multiple 06/23/2018  . AF (paroxysmal  atrial fibrillation) ([place]) 06/18/2018  . Diabetic gastroparesis ([place]) 06/18/2018  . Protein calorie malnutrition ([place]) 06/18/2018  . GERD (gastroesophageal reflux disease) 06/18/2018  . Acute hypoxemic respiratory failure ([place]) 06/17/2018  . Bilateral carotid artery disease ([place]) 11/30/2015  . Encounter for loop recorder at end of battery life 05/22/2014  . CKD (chronic kidney disease) stage 3, GFR 30-59 ml/min (HCC) 05/22/2014  . History of stroke 05/22/2014  . Type 2 diabetes mellitus with chronic kidney disease ([place]) 05/22/2014  . Mild aortic stenosis by prior echocardiogram 05/22/2014  . Atrial tachycardia, paroxysmal ([place]) 02/10/2013  . Hyperlipidemia   . Hypertension   . Sacral decubitus ulcer   . Diabetes mellitus   . Systolic murmur   . Hyperglycemia   . PAD (peripheral artery disease) ([place])   . CAD (coronary artery disease) CABG x5 2011   . Obstructive sleep apnea   . Vitamin D deficiency    Home Medication(s) Prior to Admission medications   Medication Sig Start Date End Date Taking? Authorizing Provider  acetaminophen (TYLENOL) 325 MG tablet Take 650 mg by mouth every 6 (six) hours as needed for mild pain or headache.     [provider]  albuterol (PROVENTIL HFA;VENTOLIN HFA) 108 (90 Base) MCG/ACT inhaler Inhale 2 puffs into the lungs every 6 (six) hours as needed for wheezing  or shortness of breath. 06/19/18   Amin, Jeanella Flattery, MD  ALPRAZolam (XANAX) 0.25 MG tablet TAKE 2 TABLETS (0.5MG ) BY MOUTH 3 TIMES A DAY AS NEEDED FOR ANXIETY 06/25/18   Susy Frizzle, MD  ALPRAZolam Duanne Moron) 1 MG tablet Take 0.5 tablets (0.5 mg total) by mouth 3 (three) times daily as needed for anxiety. Patient not taking: Reported on 07/02/2018 06/30/18   Alycia Rossetti, MD  ALPRAZolam Duanne Moron) 1 MG tablet Take 0.5 mg by mouth 3 (three) times daily as needed for anxiety or sleep.    [provider]  amLODipine (NORVASC) 10 MG tablet Take 10 mg by mouth daily.       [provider]  aspirin 81 MG tablet Take 81 mg by mouth daily.     [provider]  B-D ULTRAFINE III SHORT PEN 31G X 8 MM MISC USE AS DIRECTED 05/11/18   Susy Frizzle, MD  clopidogrel (PLAVIX) 75 MG tablet Take 1 tablet (75 mg total) by mouth daily. Patient not taking: Reported on 07/02/2018 08/05/17   Lorretta Harp, MD  [employer] VITAMIN D 2000 units CAPS TAKE ONE CAPSULE BY MOUTH DAILY 12/19/16   Susy Frizzle, MD  furosemide (LASIX) 40 MG tablet Take 1 tablet (40 mg total) by mouth daily. Patient taking differently: Take 40 mg by mouth daily as needed for fluid.  08/05/17   Lorretta Harp, MD  hydrALAZINE (APRESOLINE) 25 MG tablet Take 1 tablet (25 mg total) by mouth daily as needed. Patient taking differently: Take 25 mg by mouth daily.  08/05/17   Lorretta Harp, MD  HYDROcodone-acetaminophen (NORCO) 5-325 MG tablet Take 1 tablet by mouth every 6 (six) hours as needed for moderate pain. 06/25/18   Susy Frizzle, MD  LEVEMIR FLEXTOUCH 100 UNIT/ML Pen INJECT 80 UNITS INTO THE SKIN 2 TIMES DAILY. Patient taking differently: Inject 50 Units into the skin 2 (two) times daily.  05/03/18   Susy Frizzle, MD  lisinopril (PRINIVIL,ZESTRIL) 20 MG tablet TAKE 1 TABLET (20 MG TOTAL) BY MOUTH DAILY. 12/29/17   Susy Frizzle, MD  metoCLOPramide (REGLAN) 5 MG tablet TAKE 1 TABLET BY MOUTH 3 TIMES A DAY BEFORE MEALS AND 1 AT BEDTIME Patient taking differently: Take 5 mg by mouth See admin instructions. TAKE 1 TABLET BY MOUTH 3 TIMES A DAY BEFORE MEALS AND 1 AT BEDTIME 06/19/17   Susy Frizzle, MD  niacin (NIASPAN) 750 MG CR tablet TAKE 1 TABLET BY MOUTH EVERY DAY AT BEDTIME Patient not taking: No sig reported 10/27/17   Susy Frizzle, MD  pantoprazole (PROTONIX) 40 MG tablet TAKE 1 TABLET BY MOUTH EVERY DAY Patient taking differently: Take 40 mg by mouth daily.  08/07/17   Susy Frizzle, MD  phenylephrine (SUDAFED PE) 10 MG TABS tablet Take 10 mg by mouth 2 (two)  times daily.     [provider]  potassium chloride SA (K-DUR,KLOR-CON) 20 MEQ tablet Take 1 tablet (20 mEq total) by mouth daily. 07/02/18   Sherwood Gambler, MD  promethazine (PHENERGAN) 25 MG tablet Take 1 tablet (25 mg total) by mouth every 8 (eight) hours as needed for nausea or vomiting. 06/30/18   Susy Frizzle, MD  rosuvastatin (CRESTOR) 10 MG tablet Take 1 tablet (10 mg total) by mouth daily. 06/25/18   Susy Frizzle, MD  silver sulfADIAZINE (SILVADENE) 1 % cream Apply 1 application topically daily. 06/25/18   Susy Frizzle, MD  TRADJENTA 5 MG  TABS tablet TAKE 1 TABLET BY MOUTH EVERY DAY Patient taking differently: Take 5 mg by mouth daily.  04/29/18   Susy Frizzle, MD                                                                                                                                    Past Surgical History Past Surgical History:  Procedure Laterality Date  . ANGIOPLASTY / STENTING FEMORAL Right    leg  . BREAST SURGERY     biopsy  . Carotid doppler  10/13/2011   right ICA-49% diameter reduction and left ICA with 50%-69% diameter reduction  . CATARACT EXTRACTION W/ INTRAOCULAR LENS  IMPLANT, BILATERAL    . CHOLECYSTECTOMY    . COLONOSCOPY W/ BIOPSIES AND POLYPECTOMY    . CORONARY ARTERY BYPASS GRAFT  05/22/2010  . Heart bypass    . Myoview Stress  08/13/2010   inferolateral scar without ischemia  . VIDEO BRONCHOSCOPY WITH ENDOBRONCHIAL ULTRASOUND N/A 07/07/2018   Procedure: VIDEO BRONCHOSCOPY WITH ENDOBRONCHIAL ULTRASOUND;  Surgeon: Garner Nash, DO;  Location: [facility] OR;  Service: Thoracic;  Laterality: N/A;   Family History Family History  Problem Relation Age of Onset  . Cancer Mother        Did not know what kind  . COPD Brother   . Diabetes Sister   . Heart disease Brother   . Colon cancer Neg Hx   . Colon polyps Neg Hx   . Gallbladder disease Neg Hx   . Kidney disease Neg Hx   . Esophageal cancer Neg Hx     Social  History Social History   Tobacco Use  . Smoking status: Former Smoker    Packs/day: 1.00    Years: 52.00    Pack years: 52.00    Start date: 08/04/1957    Last attempt to quit: 01/22/2010    Years since quitting: 8.4  . Smokeless tobacco: Never Used  Substance Use Topics  . Alcohol use: No  . Drug use: No   Allergies Neosporin [neomycin-polymyxin-gramicidin]; Latex; and Sulfa antibiotics  Review of Systems Review of Systems All other systems are reviewed and are negative for acute change except as noted in the HPI  Physical Exam Vital Signs  I have reviewed the triage vital signs BP (!) 161/54   Pulse 72   Temp 97.6 F (36.4 C) (Oral)   Resp (!) 3   Ht 4' 10.5" (1.486 m)   Wt 91.6 kg   SpO2 98%   BMI 41.50 kg/m   Physical Exam  Constitutional: She is oriented to person, place, and time. She appears well-developed and well-nourished. No distress.  Chronically ill-appearing  HENT:  Head: Normocephalic and atraumatic.  Nose: Nose normal.  Edentulous   Eyes: Pupils are equal, round, and reactive to light. Conjunctivae and EOM are normal. Right eye exhibits no discharge. Left eye exhibits no discharge. No scleral icterus.  Neck:  Normal range of motion. Neck supple.  Cardiovascular: Normal rate and regular rhythm. Exam reveals no gallop and no friction rub.  No murmur heard. Pulmonary/Chest: Effort normal and breath sounds normal. No stridor. No respiratory distress. She has no rales.  Abdominal: Soft. She exhibits no distension. There is no tenderness.  Musculoskeletal: She exhibits no edema or tenderness.  Neurological: She is alert and oriented to person, place, and time.  Mental Status:  Alert and oriented to person, place, and time.  Attention and concentration normal.  Speech clear.  Recent memory is intact  Cranial Nerves:  II Visual Fields: left hemianopsia (from prior stroke) III, IV, VI: Pupils equal and reactive to light and near. Full eye movement  without nystagmus  V Facial Sensation: Normal. No weakness of masticatory muscles  VII: No facial weakness or asymmetry  VIII Auditory Acuity: Grossly normal  IX/X: The uvula is midline; the palate elevates symmetrically  XI: Normal sternocleidomastoid and trapezius strength  XII: The tongue is midline. No atrophy or fasciculations.   Motor System: Muscle Strength: 5/5 and symmetric in the upper and lower extremities. No pronation or drift.  Muscle Tone: Tone and muscle bulk are normal in the upper and lower extremities.   Reflexes: DTRs: 1+ and symmetrical in all four extremities. No Clonus Coordination: No tremor.  Sensation: Intact to light touch. Gait: Deferred   Skin: Skin is warm and dry. No rash noted. She is not diaphoretic. No erythema.  Psychiatric: She has a normal mood and affect.  Vitals reviewed.   ED Results and Treatments Labs (all labs ordered are listed, but only abnormal results are displayed) Labs Reviewed  CBC - Abnormal; Notable for the following components:      Result Value   WBC 17.0 (*)    Hemoglobin 11.4 (*)    MCHC 29.2 (*)    All other components within normal limits  DIFFERENTIAL - Abnormal; Notable for the following components:   Neutro Abs 14.8 (*)    Abs Immature Granulocytes 0.09 (*)    All other components within normal limits  COMPREHENSIVE METABOLIC PANEL - Abnormal; Notable for the following components:   Chloride 94 (*)    CO2 39 (*)    BUN 36 (*)    Creatinine, Ser 1.28 (*)    Calcium 8.8 (*)    Total Protein 6.4 (*)    Albumin 2.7 (*)    GFR calc non Af Amer 41 (*)    GFR calc Af Amer 48 (*)    All other components within normal limits  I-STAT CHEM 8, ED - Abnormal; Notable for the following components:   Chloride 93 (*)    BUN 37 (*)    Creatinine, Ser 1.20 (*)    Calcium, Ion 1.07 (*)    TCO2 41 (*)    All other components within normal limits  CBG MONITORING, ED - Abnormal; Notable for the following components:    Glucose-Capillary 68 (*)    All other components within normal limits  ETHANOL  PROTIME-INR  APTT  RAPID URINE DRUG SCREEN, HOSP PERFORMED  URINALYSIS, ROUTINE W REFLEX MICROSCOPIC  I-STAT TROPONIN, ED  EKG  EKG Interpretation  Date/Time:  Thursday July 08 2018 23:05:04 EST Ventricular Rate:  88 PR Interval:    QRS Duration: 107 QT Interval:  353 QTC Calculation: 428 R Axis:   -13 Text Interpretation:  Normal sinus rhythm Artifact Otherwise no significant change Confirmed by Addison Lank [phone]) on 07/09/2018 5:40:32 AM      Radiology Ct Head Wo Contrast  Result Date: 07/08/2018 CLINICAL DATA:  72 year old female with seizure like activity and encephalopathy. History of lung cancer. EXAM: CT HEAD WITHOUT CONTRAST TECHNIQUE: Contiguous axial images were obtained from the base of the skull through the vertex without intravenous contrast. COMPARISON:  Head CT dated 01/19/2010 FINDINGS: Brain: There is a 12 x 13 mm high attenuating mass in the left frontal lobe with surrounding edema. An additional 12 x 12 mm high attenuating mass in the left posterior frontal convexity along the falx noted with mild surrounding edema. There is a large area of encephalomalacia in the right frontoparietal region. There is otherwise mild age-related atrophy and chronic microvascular ischemic changes. There is no acute intracranial hemorrhage. No midline shift. Vascular: No hyperdense vessel or unexpected calcification. Skull: Normal. Negative for fracture or focal lesion. Sinuses/Orbits: No acute finding. Other: None IMPRESSION: 1. No acute intracranial hemorrhage. 2. Left cerebral masses as described most consistent with metastatic disease. Further evaluation with MRI recommended. 3. Large area of encephalomalacia in the right frontoparietal region. Electronically Signed   By: Anner Crete M.D.   On: 07/08/2018 23:58   Dg Chest Port 1 View  Result Date: 07/09/2018 CLINICAL DATA:  Cough EXAM: PORTABLE CHEST 1 VIEW COMPARISON:  07/02/2018 FINDINGS: Large right central mass and numerous bilateral pulmonary metastases, stable since prior study. Prior CABG. Cardiomegaly. No visible significant effusions or acute bony abnormality. IMPRESSION: No significant change in the right lung mass and numerous bilateral pulmonary metastases. Electronically Signed   By: Rolm Baptise M.D.   On: 07/09/2018 00:26   Pertinent labs & imaging results that were available during my care of the patient were reviewed by me and considered in my medical decision making (see chart for details).  Medications Ordered in ED Medications  sodium chloride 0.9 % bolus 500 mL (0 mLs Intravenous Stopped 07/09/18 0114)    Followed by  0.9 %  sodium chloride infusion (100 mL/hr Intravenous New Bag/Given 07/08/18 2330)  levETIRAcetam (KEPPRA) IVPB 1000 mg/100 mL premix (1,000 mg Intravenous New Bag/Given 07/09/18 0217)  levETIRAcetam (KEPPRA) IVPB 500 mg/100 mL premix (has no administration in time range)                                                                                                                                    Procedures .Critical Care Performed by: Fatima Blank, MD Authorized by: Fatima Blank, MD     CRITICAL CARE Performed by: Grayce Sessions Cardama Total critical care time: 55 minutes Critical care time was  exclusive of separately billable procedures and treating other patients. Critical care was necessary to treat or prevent imminent or life-threatening deterioration. Critical care was time spent personally by me on the following activities: development of treatment plan with patient and/or surrogate as well as nursing, discussions with consultants, evaluation of patient's response to treatment, examination of patient, obtaining history from patient or surrogate,  ordering and performing treatments and interventions, ordering and review of laboratory studies, ordering and review of radiographic studies, pulse oximetry and re-evaluation of patient's condition.   (including critical care time)  Medical Decision Making / ED Course I have reviewed the nursing notes for this encounter and the patient's prior records (if available in EHR or on provided paperwork).    Work-up notable for evidence of metastatic disease to the brain which is the likely etiology of patient's presentation.  She does have a history of prior strokes and the seizures may be related to that.  I see no evidence of electrolyte derangements that would be contributing to her presentation.  No infectious sources.  She was loaded with Keppra.  Patient also given Decadron due to the edema.  Case discussed with neurology who agreed.  Recommended neuro oncology/neurosurgery evaluation.  Patient admitted to medicine for continued work-up and management.  Final Clinical Impression(s) / ED Diagnoses Final diagnoses:  Cough  Seizure-like activity ([place])  Brain metastases ([place])      This chart was dictated using voice recognition software.  Despite best efforts to proofread,  errors can occur which can change the documentation meaning.   Fatima Blank, MD 07/09/18 [id number]    Fatima Blank, MD 07/19/18 1116

## 2018-07-09 ENCOUNTER — Emergency Department (HOSPITAL_COMMUNITY): Payer: Medicare Other

## 2018-07-09 ENCOUNTER — Other Ambulatory Visit: Payer: Self-pay

## 2018-07-09 ENCOUNTER — Encounter (HOSPITAL_COMMUNITY): Payer: Self-pay

## 2018-07-09 ENCOUNTER — Inpatient Hospital Stay: Payer: Medicare Other | Admitting: Internal Medicine

## 2018-07-09 ENCOUNTER — Inpatient Hospital Stay: Payer: Medicare Other | Attending: Internal Medicine

## 2018-07-09 DIAGNOSIS — Z7902 Long term (current) use of antithrombotics/antiplatelets: Secondary | ICD-10-CM | POA: Diagnosis not present

## 2018-07-09 DIAGNOSIS — Z515 Encounter for palliative care: Secondary | ICD-10-CM | POA: Diagnosis not present

## 2018-07-09 DIAGNOSIS — D72829 Elevated white blood cell count, unspecified: Secondary | ICD-10-CM

## 2018-07-09 DIAGNOSIS — J9611 Chronic respiratory failure with hypoxia: Secondary | ICD-10-CM | POA: Diagnosis present

## 2018-07-09 DIAGNOSIS — I2510 Atherosclerotic heart disease of native coronary artery without angina pectoris: Secondary | ICD-10-CM

## 2018-07-09 DIAGNOSIS — Z951 Presence of aortocoronary bypass graft: Secondary | ICD-10-CM | POA: Diagnosis not present

## 2018-07-09 DIAGNOSIS — R569 Unspecified convulsions: Secondary | ICD-10-CM | POA: Diagnosis not present

## 2018-07-09 DIAGNOSIS — K219 Gastro-esophageal reflux disease without esophagitis: Secondary | ICD-10-CM | POA: Diagnosis present

## 2018-07-09 DIAGNOSIS — M2550 Pain in unspecified joint: Secondary | ICD-10-CM | POA: Diagnosis not present

## 2018-07-09 DIAGNOSIS — C3490 Malignant neoplasm of unspecified part of unspecified bronchus or lung: Secondary | ICD-10-CM | POA: Diagnosis present

## 2018-07-09 DIAGNOSIS — D61818 Other pancytopenia: Secondary | ICD-10-CM | POA: Diagnosis present

## 2018-07-09 DIAGNOSIS — N179 Acute kidney failure, unspecified: Secondary | ICD-10-CM | POA: Diagnosis present

## 2018-07-09 DIAGNOSIS — Z9981 Dependence on supplemental oxygen: Secondary | ICD-10-CM | POA: Diagnosis not present

## 2018-07-09 DIAGNOSIS — T383X5A Adverse effect of insulin and oral hypoglycemic [antidiabetic] drugs, initial encounter: Secondary | ICD-10-CM | POA: Diagnosis not present

## 2018-07-09 DIAGNOSIS — R0602 Shortness of breath: Secondary | ICD-10-CM | POA: Diagnosis not present

## 2018-07-09 DIAGNOSIS — Z87891 Personal history of nicotine dependence: Secondary | ICD-10-CM | POA: Diagnosis not present

## 2018-07-09 DIAGNOSIS — I480 Paroxysmal atrial fibrillation: Secondary | ICD-10-CM

## 2018-07-09 DIAGNOSIS — I10 Essential (primary) hypertension: Secondary | ICD-10-CM | POA: Diagnosis not present

## 2018-07-09 DIAGNOSIS — E785 Hyperlipidemia, unspecified: Secondary | ICD-10-CM | POA: Diagnosis present

## 2018-07-09 DIAGNOSIS — I2583 Coronary atherosclerosis due to lipid rich plaque: Secondary | ICD-10-CM

## 2018-07-09 DIAGNOSIS — E160 Drug-induced hypoglycemia without coma: Secondary | ICD-10-CM | POA: Diagnosis present

## 2018-07-09 DIAGNOSIS — I5030 Unspecified diastolic (congestive) heart failure: Secondary | ICD-10-CM | POA: Diagnosis present

## 2018-07-09 DIAGNOSIS — Z7982 Long term (current) use of aspirin: Secondary | ICD-10-CM | POA: Diagnosis not present

## 2018-07-09 DIAGNOSIS — C3411 Malignant neoplasm of upper lobe, right bronchus or lung: Secondary | ICD-10-CM | POA: Diagnosis present

## 2018-07-09 DIAGNOSIS — G4089 Other seizures: Secondary | ICD-10-CM | POA: Diagnosis present

## 2018-07-09 DIAGNOSIS — C3491 Malignant neoplasm of unspecified part of right bronchus or lung: Secondary | ICD-10-CM

## 2018-07-09 DIAGNOSIS — C7931 Secondary malignant neoplasm of brain: Secondary | ICD-10-CM | POA: Diagnosis present

## 2018-07-09 DIAGNOSIS — Z7401 Bed confinement status: Secondary | ICD-10-CM | POA: Diagnosis not present

## 2018-07-09 DIAGNOSIS — R05 Cough: Secondary | ICD-10-CM | POA: Diagnosis not present

## 2018-07-09 DIAGNOSIS — Z79899 Other long term (current) drug therapy: Secondary | ICD-10-CM | POA: Diagnosis not present

## 2018-07-09 DIAGNOSIS — I130 Hypertensive heart and chronic kidney disease with heart failure and stage 1 through stage 4 chronic kidney disease, or unspecified chronic kidney disease: Secondary | ICD-10-CM | POA: Diagnosis present

## 2018-07-09 DIAGNOSIS — I959 Hypotension, unspecified: Secondary | ICD-10-CM | POA: Diagnosis not present

## 2018-07-09 DIAGNOSIS — Z794 Long term (current) use of insulin: Secondary | ICD-10-CM | POA: Diagnosis not present

## 2018-07-09 DIAGNOSIS — Z833 Family history of diabetes mellitus: Secondary | ICD-10-CM | POA: Diagnosis not present

## 2018-07-09 DIAGNOSIS — J90 Pleural effusion, not elsewhere classified: Secondary | ICD-10-CM | POA: Diagnosis not present

## 2018-07-09 DIAGNOSIS — G8929 Other chronic pain: Secondary | ICD-10-CM | POA: Diagnosis not present

## 2018-07-09 DIAGNOSIS — Z8249 Family history of ischemic heart disease and other diseases of the circulatory system: Secondary | ICD-10-CM | POA: Diagnosis not present

## 2018-07-09 DIAGNOSIS — Z85118 Personal history of other malignant neoplasm of bronchus and lung: Secondary | ICD-10-CM | POA: Diagnosis not present

## 2018-07-09 DIAGNOSIS — E440 Moderate protein-calorie malnutrition: Secondary | ICD-10-CM | POA: Diagnosis present

## 2018-07-09 DIAGNOSIS — I69398 Other sequelae of cerebral infarction: Secondary | ICD-10-CM | POA: Diagnosis not present

## 2018-07-09 DIAGNOSIS — Z6841 Body Mass Index (BMI) 40.0 and over, adult: Secondary | ICD-10-CM | POA: Diagnosis not present

## 2018-07-09 DIAGNOSIS — H53462 Homonymous bilateral field defects, left side: Secondary | ICD-10-CM | POA: Diagnosis present

## 2018-07-09 DIAGNOSIS — Z7189 Other specified counseling: Secondary | ICD-10-CM | POA: Diagnosis not present

## 2018-07-09 LAB — DIFFERENTIAL
Abs Immature Granulocytes: 0.09 10*3/uL — ABNORMAL HIGH (ref 0.00–0.07)
BASOS ABS: 0 10*3/uL (ref 0.0–0.1)
Basophils Relative: 0 %
Eosinophils Absolute: 0.1 10*3/uL (ref 0.0–0.5)
Eosinophils Relative: 0 %
Immature Granulocytes: 1 %
LYMPHS ABS: 1.1 10*3/uL (ref 0.7–4.0)
LYMPHS PCT: 6 %
Monocytes Absolute: 1 10*3/uL (ref 0.1–1.0)
Monocytes Relative: 6 %
Neutro Abs: 14.8 10*3/uL — ABNORMAL HIGH (ref 1.7–7.7)
Neutrophils Relative %: 87 %

## 2018-07-09 LAB — CBC
HCT: 39.1 % (ref 36.0–46.0)
HCT: 30.8 % — ABNORMAL LOW (ref 36.0–46.0)
Hemoglobin: 11.4 g/dL — ABNORMAL LOW (ref 12.0–15.0)
Hemoglobin: 8.9 g/dL — ABNORMAL LOW (ref 12.0–15.0)
MCH: 28.6 pg (ref 26.0–34.0)
MCH: 28.8 pg (ref 26.0–34.0)
MCHC: 29.2 g/dL — ABNORMAL LOW (ref 30.0–36.0)
MCHC: 28.9 g/dL — ABNORMAL LOW (ref 30.0–36.0)
MCV: 98 fL (ref 80.0–100.0)
MCV: 99.7 fL (ref 80.0–100.0)
NRBC: 0 % (ref 0.0–0.2)
Platelets: 173 10*3/uL (ref 150–400)
Platelets: 140 10*3/uL — ABNORMAL LOW (ref 150–400)
RBC: 3.99 MIL/uL (ref 3.87–5.11)
RBC: 3.09 MIL/uL — ABNORMAL LOW (ref 3.87–5.11)
RDW: 14.7 % (ref 11.5–15.5)
RDW: 14.9 % (ref 11.5–15.5)
WBC: 17 10*3/uL — ABNORMAL HIGH (ref 4.0–10.5)
WBC: 11.8 10*3/uL — ABNORMAL HIGH (ref 4.0–10.5)
nRBC: 0 % (ref 0.0–0.2)

## 2018-07-09 LAB — URINALYSIS, ROUTINE W REFLEX MICROSCOPIC
Bacteria, UA: NONE SEEN
Bilirubin Urine: NEGATIVE
Glucose, UA: NEGATIVE mg/dL
Hgb urine dipstick: NEGATIVE
Ketones, ur: NEGATIVE mg/dL
Leukocytes, UA: NEGATIVE
Nitrite: NEGATIVE
Protein, ur: 100 mg/dL — AB
Specific Gravity, Urine: 1.015 (ref 1.005–1.030)
pH: 6 (ref 5.0–8.0)

## 2018-07-09 LAB — COMPREHENSIVE METABOLIC PANEL
ALBUMIN: 2.7 g/dL — AB (ref 3.5–5.0)
ALBUMIN: 2.5 g/dL — AB (ref 3.5–5.0)
ALK PHOS: 53 U/L (ref 38–126)
ALT: 14 U/L (ref 0–44)
ALT: 15 U/L (ref 0–44)
AST: 19 U/L (ref 15–41)
AST: 19 U/L (ref 15–41)
Alkaline Phosphatase: 49 U/L (ref 38–126)
Anion gap: 11 (ref 5–15)
Anion gap: 8 (ref 5–15)
BILIRUBIN TOTAL: 0.4 mg/dL (ref 0.3–1.2)
BUN: 36 mg/dL — ABNORMAL HIGH (ref 8–23)
BUN: 34 mg/dL — ABNORMAL HIGH (ref 8–23)
CALCIUM: 8.8 mg/dL — AB (ref 8.9–10.3)
CO2: 39 mmol/L — ABNORMAL HIGH (ref 22–32)
CO2: 39 mmol/L — AB (ref 22–32)
Calcium: 8.3 mg/dL — ABNORMAL LOW (ref 8.9–10.3)
Chloride: 94 mmol/L — ABNORMAL LOW (ref 98–111)
Chloride: 97 mmol/L — ABNORMAL LOW (ref 98–111)
Creatinine, Ser: 1.28 mg/dL — ABNORMAL HIGH (ref 0.44–1.00)
Creatinine, Ser: 1.17 mg/dL — ABNORMAL HIGH (ref 0.44–1.00)
GFR calc Af Amer: 48 mL/min — ABNORMAL LOW (ref >60)
GFR calc Af Amer: 54 mL/min — ABNORMAL LOW (ref >60)
GFR calc non Af Amer: 41 mL/min — ABNORMAL LOW (ref >60)
GFR calc non Af Amer: 46 mL/min — ABNORMAL LOW (ref >60)
Glucose, Bld: 98 mg/dL (ref 70–99)
Glucose, Bld: 99 mg/dL (ref 70–99)
Potassium: 4.9 mmol/L (ref 3.5–5.1)
Potassium: 5.3 mmol/L — ABNORMAL HIGH (ref 3.5–5.1)
Sodium: 144 mmol/L (ref 135–145)
Sodium: 144 mmol/L (ref 135–145)
Total Bilirubin: 0.3 mg/dL (ref 0.3–1.2)
Total Protein: 6.4 g/dL — ABNORMAL LOW (ref 6.5–8.1)
Total Protein: 5.9 g/dL — ABNORMAL LOW (ref 6.5–8.1)

## 2018-07-09 LAB — RAPID URINE DRUG SCREEN, HOSP PERFORMED
Amphetamines: NOT DETECTED
Barbiturates: NOT DETECTED
Benzodiazepines: NOT DETECTED
Cocaine: NOT DETECTED
Opiates: NOT DETECTED
Tetrahydrocannabinol: NOT DETECTED

## 2018-07-09 LAB — ETHANOL: Alcohol, Ethyl (B): <10 mg/dL (ref <10)

## 2018-07-09 LAB — TYPE AND SCREEN
ABO/RH(D): AB POS
Antibody Screen: NEGATIVE

## 2018-07-09 LAB — CBG MONITORING, ED
Glucose-Capillary: 68 mg/dL — ABNORMAL LOW (ref 70–99)
Glucose-Capillary: 91 mg/dL (ref 70–99)
Glucose-Capillary: 181 mg/dL — ABNORMAL HIGH (ref 70–99)
Glucose-Capillary: 212 mg/dL — ABNORMAL HIGH (ref 70–99)

## 2018-07-09 LAB — APTT: aPTT: 29 seconds (ref 24–36)

## 2018-07-09 LAB — GLUCOSE, CAPILLARY
Glucose-Capillary: 259 mg/dL — ABNORMAL HIGH (ref 70–99)
Glucose-Capillary: 127 mg/dL — ABNORMAL HIGH (ref 70–99)

## 2018-07-09 LAB — PROTIME-INR
INR: 0.95
PROTHROMBIN TIME: 12.6 s (ref 11.4–15.2)

## 2018-07-09 LAB — POTASSIUM: Potassium: 5.5 mmol/L — ABNORMAL HIGH (ref 3.5–5.1)

## 2018-07-09 MED ORDER — IPRATROPIUM-ALBUTEROL 0.5-2.5 (3) MG/3ML IN SOLN
3.0000 mL | Freq: Four times a day (QID) | RESPIRATORY_TRACT | Status: DC
  Administered 2018-07-09 – 2018-07-11 (×8): 3 mL via RESPIRATORY_TRACT
  Filled 2018-07-09 (×9): qty 3

## 2018-07-09 MED ORDER — IPRATROPIUM-ALBUTEROL 0.5-2.5 (3) MG/3ML IN SOLN
3.0000 mL | RESPIRATORY_TRACT | Status: DC | PRN
  Administered 2018-07-09: 3 mL via RESPIRATORY_TRACT
  Filled 2018-07-09: qty 3

## 2018-07-09 MED ORDER — DEXAMETHASONE SODIUM PHOSPHATE 10 MG/ML IJ SOLN
10.0000 mg | Freq: Once | INTRAMUSCULAR | Status: AC
  Administered 2018-07-09: 10 mg via INTRAVENOUS
  Filled 2018-07-09: qty 1

## 2018-07-09 MED ORDER — SODIUM POLYSTYRENE SULFONATE 15 GM/60ML PO SUSP
15.0000 g | Freq: Once | ORAL | Status: DC
  Filled 2018-07-09: qty 60

## 2018-07-09 MED ORDER — INSULIN ASPART 100 UNIT/ML ~~LOC~~ SOLN: 0.0000 [IU] | Freq: Every day | SUBCUTANEOUS | Status: DC

## 2018-07-09 MED ORDER — POLYETHYLENE GLYCOL 3350 17 G PO PACK
17.0000 g | PACK | Freq: Every day | ORAL | Status: DC
  Filled 2018-07-09 (×4): qty 1

## 2018-07-09 MED ORDER — INSULIN ASPART 100 UNIT/ML ~~LOC~~ SOLN
0.0000 [IU] | Freq: Three times a day (TID) | SUBCUTANEOUS | Status: DC
  Administered 2018-07-10: 2 [IU] via SUBCUTANEOUS
  Administered 2018-07-10: 3 [IU] via SUBCUTANEOUS
  Administered 2018-07-10: 8 [IU] via SUBCUTANEOUS
  Administered 2018-07-11 – 2018-07-13 (×3): 3 [IU] via SUBCUTANEOUS
  Administered 2018-07-14 – 2018-07-15 (×3): 2 [IU] via SUBCUTANEOUS
  Administered 2018-07-15: 3 [IU] via SUBCUTANEOUS
  Administered 2018-07-16: 2 [IU] via SUBCUTANEOUS

## 2018-07-09 MED ORDER — LORAZEPAM 2 MG/ML IJ SOLN: 1.0000 mg | INTRAMUSCULAR | Status: DC | PRN

## 2018-07-09 MED ORDER — ACETAMINOPHEN 650 MG RE SUPP: 650.0000 mg | Freq: Four times a day (QID) | RECTAL | Status: DC | PRN

## 2018-07-09 MED ORDER — LEVETIRACETAM IN NACL 1000 MG/100ML IV SOLN
1000.0000 mg | Freq: Once | INTRAVENOUS | Status: AC
  Administered 2018-07-09: 1000 mg via INTRAVENOUS
  Filled 2018-07-09: qty 100

## 2018-07-09 MED ORDER — DEXAMETHASONE SODIUM PHOSPHATE 10 MG/ML IJ SOLN
10.0000 mg | Freq: Four times a day (QID) | INTRAMUSCULAR | Status: DC
  Administered 2018-07-10 (×2): 10 mg via INTRAVENOUS
  Filled 2018-07-09 (×4): qty 1

## 2018-07-09 MED ORDER — SALINE SPRAY 0.65 % NA SOLN
1.0000 | Freq: Once | NASAL | Status: AC
  Administered 2018-07-09: 1 via NASAL
  Filled 2018-07-09: qty 44

## 2018-07-09 MED ORDER — LEVETIRACETAM IN NACL 500 MG/100ML IV SOLN
500.0000 mg | Freq: Once | INTRAVENOUS | Status: AC
  Administered 2018-07-09: 500 mg via INTRAVENOUS
  Filled 2018-07-09: qty 100

## 2018-07-09 MED ORDER — PANTOPRAZOLE SODIUM 40 MG PO TBEC
40.0000 mg | DELAYED_RELEASE_TABLET | Freq: Every day | ORAL | Status: DC
  Administered 2018-07-09 – 2018-07-16 (×8): 40 mg via ORAL
  Filled 2018-07-09 (×8): qty 1

## 2018-07-09 MED ORDER — ACETAMINOPHEN 325 MG PO TABS
650.0000 mg | ORAL_TABLET | Freq: Four times a day (QID) | ORAL | Status: DC | PRN
  Administered 2018-07-12 – 2018-07-13 (×2): 650 mg via ORAL
  Filled 2018-07-09 (×2): qty 2

## 2018-07-09 MED ORDER — SODIUM CHLORIDE 0.9% FLUSH
3.0000 mL | Freq: Two times a day (BID) | INTRAVENOUS | Status: DC
  Administered 2018-07-09 – 2018-07-15 (×11): 3 mL via INTRAVENOUS

## 2018-07-09 MED ORDER — ONDANSETRON HCL 4 MG PO TABS: 4.0000 mg | ORAL_TABLET | Freq: Four times a day (QID) | ORAL | Status: DC | PRN

## 2018-07-09 MED ORDER — ONDANSETRON HCL 4 MG/2ML IJ SOLN
4.0000 mg | Freq: Four times a day (QID) | INTRAMUSCULAR | Status: DC | PRN
  Administered 2018-07-11 – 2018-07-12 (×2): 4 mg via INTRAVENOUS
  Filled 2018-07-09 (×2): qty 2

## 2018-07-09 NOTE — Plan of Care (Signed)

## 2018-07-09 NOTE — ED Notes (Signed)
Pt called out stating that she felt weird. RN asked to see if she had diabetes. CBG was checked it was 68. Apple juice was given to patient

## 2018-07-09 NOTE — Progress Notes (Addendum)
[name] is a 72 y.o. female with medical history significant of hypertension, hyperlipidemia, paroxysmal atrial fibrillation, COPD oxygen dependent, coronary artery disease, diabetes mellitus type 2, and chronic kidney disease; who presents after having a witnessed seizure at home.   She underwent bronchoscopy 2 days ago , with endobronchial [place] and biopsies revealing poorly differentiated carcinoma with mets to lungs and brain. She saw Dr Julien Nordmann one week ago at Cancer center.     On exam  She is alert but in mild ot mod distress from sob on [place] oxygen.  Lungs: bilateral wheezing heard, tachypnea.  CVS s1s2, tachycardia present.  Abdomen: soft non tender non distended bowel sounds good.  Neuro: she is alert and oriented and grossly non focal.    Plan:  Seizures:  Neurology consulted by admitting team, continue with Keppra 500 mg twice daily and Decadron. Discussed with Dr Alvy Bimler and will transfer the patient to [facility] for further radiation.  She will need radiation oncology consult.   Pittore failure with hypoxia secondary to poorly differentiated metastatic lung cancer Patient currently on nasal cannula oxygen with good sats.    History of paroxysmal atrial fibrillation Rate better, not on anticoagulation at this time.   Hyperkalemia Kayexalate ordered recheck potassium at 7.      Hosie Poisson, MD [phone]

## 2018-07-09 NOTE — Progress Notes (Signed)
TC to friend [name] and informed that pt. ready for transport to [place]; [facility] just called for report.

## 2018-07-09 NOTE — Progress Notes (Signed)
Pt received to the room 3W10.  No complaints of pain or discomfort.  perwick placed per pt request.  Assessment done and charted.  Pt legs noted with very scaly, dark discoloration of bilateral extremeties.  Pt states she does not walk.  IVF infusing per orders.

## 2018-07-09 NOTE — ED Notes (Signed)
Pt was made aware we need a urine sample

## 2018-07-09 NOTE — H&P (Addendum)
History and Physical    [name] CWC:[id number] DOB: 06/28/1946 DOA: 07/08/2018  Referring MD/NP/PA: Leonette Monarch PCP: Susy Frizzle, MD  Patient coming from: Via EMS   Chief Complaint: Seizure  I have personally briefly reviewed patient's old medical records in [place]   HPI: [name] is a 72 y.o. female with medical history significant of hypertension, hyperlipidemia, paroxysmal atrial fibrillation, COPD oxygen dependent, coronary artery disease, diabetes mellitus type 2, and chronic kidney disease; who presents after having a witnessed seizure at home.  Her significant other was present at the time and reports seeing generalized tonic-clonic activity which lasted approximately 10 minutes.  Thereafter patient was reportedly disoriented with slurred speech.  Patient had just recently been diagnosed with lung cancer last month after CT imaging.  She undergone video bronchoscopy 2 days prior and was being set up to follow-up with oncology.  Patient had never had seizures previously.  ED Course: Upon admission patient noted to have stable vital signs.  Labs revealed WBC 17, hemoglobin 11.4, CO2 39, BUN 36, creatinine 1.28.  Neurology was consulted. Patient received 1000 mg of Keppra, 10 mg of dexamethasone, and 500 mL of normal saline IV fluids   Review of Systems  HENT: Negative.     Past Medical History:  Diagnosis Date  . Atrial fibrillation (HCC)    paroxysmal  . CAD (coronary artery disease)    X5  . Cancer ([place])    lung  . Chronic kidney disease   . Diabetes mellitus   . Gastritis   . GI bleeding   . Hiatal hernia   . Hyperglycemia   . Hyperlipidemia   . Hypertension   . Mediastinal adenopathy    and hilar  . Obesity   . Obstructive sleep apnea    does not wear CPAP  . Osteopenia   . Oxygen dependent   . Peripheral artery disease (HCC)    lower extremities  . Pneumonia   . Stroke ([place])   . Systolic murmur   . Ulcer   . Vitamin D  deficiency   . Wears dentures     Past Surgical History:  Procedure Laterality Date  . ANGIOPLASTY / STENTING FEMORAL Right    leg  . BREAST SURGERY     biopsy  . Carotid doppler  10/13/2011   right ICA-49% diameter reduction and left ICA with 50%-69% diameter reduction  . CATARACT EXTRACTION W/ INTRAOCULAR LENS  IMPLANT, BILATERAL    . CHOLECYSTECTOMY    . COLONOSCOPY W/ BIOPSIES AND POLYPECTOMY    . CORONARY ARTERY BYPASS GRAFT  05/22/2010  . Heart bypass    . Myoview Stress  08/13/2010   inferolateral scar without ischemia  . VIDEO BRONCHOSCOPY WITH ENDOBRONCHIAL ULTRASOUND N/A 07/07/2018   Procedure: VIDEO BRONCHOSCOPY WITH ENDOBRONCHIAL ULTRASOUND;  Surgeon: Garner Nash, DO;  Location: [facility];  Service: Thoracic;  Laterality: N/A;     reports that she quit smoking about 8 years ago. She started smoking about 60 years ago. She has a 52.00 pack-year smoking history. She has never used smokeless tobacco. She reports that she does not drink alcohol or use drugs.  Allergies  Allergen Reactions  . Neosporin [Neomycin-Polymyxin-Gramicidin] Hives, Swelling and Rash  . Latex Rash  . Sulfa Antibiotics Nausea And Vomiting    Family History  Problem Relation Age of Onset  . Cancer Mother        Did not know what kind  . COPD Brother   .  Diabetes Sister   . Heart disease Brother   . Colon cancer Neg Hx   . Colon polyps Neg Hx   . Gallbladder disease Neg Hx   . Kidney disease Neg Hx   . Esophageal cancer Neg Hx     Prior to Admission medications   Medication Sig Start Date End Date Taking? Authorizing Provider  acetaminophen (TYLENOL) 325 MG tablet Take 650 mg by mouth every 6 (six) hours as needed for mild pain or headache.     [provider]  albuterol (PROVENTIL HFA;VENTOLIN HFA) 108 (90 Base) MCG/ACT inhaler Inhale 2 puffs into the lungs every 6 (six) hours as needed for wheezing or shortness of breath. 06/19/18   Amin, Jeanella Flattery, MD  ALPRAZolam (XANAX) 0.25  MG tablet TAKE 2 TABLETS (0.5MG ) BY MOUTH 3 TIMES A DAY AS NEEDED FOR ANXIETY 06/25/18   Susy Frizzle, MD  ALPRAZolam Duanne Moron) 1 MG tablet Take 0.5 tablets (0.5 mg total) by mouth 3 (three) times daily as needed for anxiety. Patient not taking: Reported on 07/02/2018 06/30/18   Alycia Rossetti, MD  ALPRAZolam Duanne Moron) 1 MG tablet Take 0.5 mg by mouth 3 (three) times daily as needed for anxiety or sleep.    [provider]  amLODipine (NORVASC) 10 MG tablet Take 10 mg by mouth daily.      [provider]  aspirin 81 MG tablet Take 81 mg by mouth daily.     [provider]  B-D ULTRAFINE III SHORT PEN 31G X 8 MM MISC USE AS DIRECTED 05/11/18   Susy Frizzle, MD  clopidogrel (PLAVIX) 75 MG tablet Take 1 tablet (75 mg total) by mouth daily. Patient not taking: Reported on 07/02/2018 08/05/17   Lorretta Harp, MD  [employer] VITAMIN D 2000 units CAPS TAKE ONE CAPSULE BY MOUTH DAILY 12/19/16   Susy Frizzle, MD  furosemide (LASIX) 40 MG tablet Take 1 tablet (40 mg total) by mouth daily. Patient taking differently: Take 40 mg by mouth daily as needed for fluid.  08/05/17   Lorretta Harp, MD  hydrALAZINE (APRESOLINE) 25 MG tablet Take 1 tablet (25 mg total) by mouth daily as needed. Patient taking differently: Take 25 mg by mouth daily.  08/05/17   Lorretta Harp, MD  HYDROcodone-acetaminophen (NORCO) 5-325 MG tablet Take 1 tablet by mouth every 6 (six) hours as needed for moderate pain. 06/25/18   Susy Frizzle, MD  LEVEMIR FLEXTOUCH 100 UNIT/ML Pen INJECT 80 UNITS INTO THE SKIN 2 TIMES DAILY. Patient taking differently: Inject 50 Units into the skin 2 (two) times daily.  05/03/18   Susy Frizzle, MD  lisinopril (PRINIVIL,ZESTRIL) 20 MG tablet TAKE 1 TABLET (20 MG TOTAL) BY MOUTH DAILY. 12/29/17   Susy Frizzle, MD  metoCLOPramide (REGLAN) 5 MG tablet TAKE 1 TABLET BY MOUTH 3 TIMES A DAY BEFORE MEALS AND 1 AT BEDTIME Patient taking differently: Take 5 mg by  mouth See admin instructions. TAKE 1 TABLET BY MOUTH 3 TIMES A DAY BEFORE MEALS AND 1 AT BEDTIME 06/19/17   Susy Frizzle, MD  niacin (NIASPAN) 750 MG CR tablet TAKE 1 TABLET BY MOUTH EVERY DAY AT BEDTIME Patient not taking: No sig reported 10/27/17   Susy Frizzle, MD  pantoprazole (PROTONIX) 40 MG tablet TAKE 1 TABLET BY MOUTH EVERY DAY Patient taking differently: Take 40 mg by mouth daily.  08/07/17   Susy Frizzle, MD  phenylephrine (SUDAFED PE) 10 MG TABS tablet  Take 10 mg by mouth 2 (two) times daily.     [provider]  potassium chloride SA (K-DUR,KLOR-CON) 20 MEQ tablet Take 1 tablet (20 mEq total) by mouth daily. 07/02/18   Sherwood Gambler, MD  promethazine (PHENERGAN) 25 MG tablet Take 1 tablet (25 mg total) by mouth every 8 (eight) hours as needed for nausea or vomiting. 06/30/18   Susy Frizzle, MD  rosuvastatin (CRESTOR) 10 MG tablet Take 1 tablet (10 mg total) by mouth daily. 06/25/18   Susy Frizzle, MD  silver sulfADIAZINE (SILVADENE) 1 % cream Apply 1 application topically daily. 06/25/18   Susy Frizzle, MD  TRADJENTA 5 MG TABS tablet TAKE 1 TABLET BY MOUTH EVERY DAY Patient taking differently: Take 5 mg by mouth daily.  04/29/18   Susy Frizzle, MD    Physical Exam:  Constitutional: Obese female currently in NAD, calm, comfortable Vitals:   07/09/18 0030 07/09/18 0045 07/09/18 0100 07/09/18 0115  BP: (!) 158/59 (!) 164/65 (!) 164/59 (!) 161/54  Pulse: 80 79 77 72  Resp: 16 14 18  (!) 3  Temp:      TempSrc:      SpO2: 97% 97% 100% 98%  Weight:      Height:       Eyes: PERRL, lids and conjunctivae normal ENMT: Mucous membranes are moist. Posterior pharynx clear of any exudate or lesions.   Neck: normal, supple, no masses, no thyromegaly Respiratory: Decreased overall aeration most notably in the right lung base.  Positive expiratory wheeze appreciated.  Patient on nasal cannula oxygen able to talk in complete sentences..    Cardiovascular: Regular rate and rhythm, no murmurs / rubs / gallops. No extremity edema. 2+ pedal pulses. No carotid bruits.  Abdomen: no tenderness, no masses palpated. No hepatosplenomegaly. Bowel sounds positive.  Musculoskeletal: no clubbing / cyanosis. No joint deformity upper and lower extremities. Good ROM, no contractures. Normal muscle tone.  Skin: no rashes, lesions, ulcers. No induration Neurologic: CN 2-12 grossly intact. Sensation intact, DTR normal. Strength 5/5 in all 4.  Psychiatric: Normal judgment and insight. Alert and oriented x 3. Normal mood.     Labs on Admission: I have personally reviewed following labs and imaging studies  CBC: Recent Labs  Lab 07/02/18 1143 07/08/18 2325 07/08/18 2331  WBC 11.8* 17.0*  --   NEUTROABS 9.4* 14.8*  --   HGB 12.1 11.4* 12.6  HCT 41.5 39.1 37.0  MCV 99.5 98.0  --   PLT 145* 173  --    Basic Metabolic Panel: Recent Labs  Lab 07/02/18 1143 07/08/18 2325 07/08/18 2331  NA 144 144 140  K 3.6 4.9 4.7  CL 101 94* 93*  CO2 34* 39*  --   GLUCOSE 144* 98 96  BUN 12 36* 37*  CREATININE 1.00 1.28* 1.20*  CALCIUM 9.4 8.8*  --    GFR: Estimated Creatinine Clearance: 40.8 mL/min (A) (by C-G formula based on SCr of 1.2 mg/dL (H)). Liver Function Tests: Recent Labs  Lab 07/02/18 1143 07/08/18 2325  AST 14* 19  ALT 12 14  ALKPHOS 49 53  BILITOT 0.2* 0.4  PROT 6.0* 6.4*  ALBUMIN 2.4* 2.7*   No results for input(s): LIPASE, AMYLASE in the last 168 hours. No results for input(s): AMMONIA in the last 168 hours. Coagulation Profile: Recent Labs  Lab 07/07/18 0935 07/08/18 2325  INR 1.02 0.95   Cardiac Enzymes: Recent Labs  Lab 07/02/18 1143  TROPONINI 0.03*   BNP (last  3 results) No results for input(s): PROBNP in the last 8760 hours. HbA1C: No results for input(s): HGBA1C in the last 72 hours. CBG: Recent Labs  Lab 07/07/18 0959 07/07/18 1053 07/07/18 1229 07/07/18 1303 07/09/18 0203  GLUCAP 150* 83  69* 130* 68*   Lipid Profile: No results for input(s): CHOL, HDL, LDLCALC, TRIG, CHOLHDL, LDLDIRECT in the last 72 hours. Thyroid Function Tests: No results for input(s): TSH, T4TOTAL, FREET4, T3FREE, THYROIDAB in the last 72 hours. Anemia Panel: No results for input(s): VITAMINB12, FOLATE, FERRITIN, TIBC, IRON, RETICCTPCT in the last 72 hours. Urine analysis:    Component Value Date/Time   COLORURINE YELLOW 02/22/2013 [place] 02/22/2013 1037   LABSPEC 1.019 02/22/2013 1037   PHURINE 5.5 02/22/2013 1037   GLUCOSEU NEG 02/22/2013 1037   HGBUR NEG 02/22/2013 1037   BILIRUBINUR NEG 02/22/2013 1037   KETONESUR NEG 02/22/2013 1037   PROTEINUR NEG 02/22/2013 1037   UROBILINOGEN 0.2 02/22/2013 1037   NITRITE NEG 02/22/2013 1037   LEUKOCYTESUR LARGE (A) 02/22/2013 1037   Sepsis Labs: No results found for this or any previous visit (from the past 240 hour(s)).   Radiological Exams on Admission: Ct Head Wo Contrast  Result Date: 07/08/2018 CLINICAL DATA:  72 year old female with seizure like activity and encephalopathy. History of lung cancer. EXAM: CT HEAD WITHOUT CONTRAST TECHNIQUE: Contiguous axial images were obtained from the base of the skull through the vertex without intravenous contrast. COMPARISON:  Head CT dated 01/19/2010 FINDINGS: Brain: There is a 12 x 13 mm high attenuating mass in the left frontal lobe with surrounding edema. An additional 12 x 12 mm high attenuating mass in the left posterior frontal convexity along the falx noted with mild surrounding edema. There is a large area of encephalomalacia in the right frontoparietal region. There is otherwise mild age-related atrophy and chronic microvascular ischemic changes. There is no acute intracranial hemorrhage. No midline shift. Vascular: No hyperdense vessel or unexpected calcification. Skull: Normal. Negative for fracture or focal lesion. Sinuses/Orbits: No acute finding. Other: None IMPRESSION: 1. No acute  intracranial hemorrhage. 2. Left cerebral masses as described most consistent with metastatic disease. Further evaluation with MRI recommended. 3. Large area of encephalomalacia in the right frontoparietal region. Electronically Signed   By: Anner Crete M.D.   On: 07/08/2018 23:58   Nm Pet Image Initial (pi) Skull Base To Thigh  Result Date: 07/07/2018 CLINICAL DATA:  Initial treatment strategy for lung cancer. EXAM: NUCLEAR MEDICINE PET SKULL BASE TO THIGH TECHNIQUE: 10.1 mCi F-18 FDG was injected intravenously. Full-ring PET imaging was performed from the skull base to thigh after the radiotracer. CT data was obtained and used for attenuation correction and anatomic localization. Fasting blood glucose: 97 mg/dl COMPARISON:  07/02/2018 and 06/18/2018. FINDINGS: Mediastinal blood pool activity: SUV max 2.4 NECK: 9 mm nodule in the posterior left parotid (CT image 22) has an SUV max of 5.8. Likely physiologic activity in the right vocal cords. No hypermetabolic lymph nodes. Incidental CT findings: Encephalomalacia in the right temporal lobe is partially imaged. CHEST: Hypermetabolic mediastinal lymph nodes are seen with an index low right paratracheal lymph node measuring 10 mm (CT image 48) with an SUV max of 7.7. Medial right upper lobe mass is difficult to measure but has an SUV max of 9.0. There are multiple hypermetabolic nodules bilaterally, seen in a hematogenous distribution. Incidental CT findings: Atherosclerotic calcification of the aorta. No pericardial effusion. Moderate bilateral pleural effusions. ABDOMEN/PELVIS: No abnormal hypermetabolism in the  liver, adrenal glands, spleen or pancreas. No hypermetabolic lymph nodes. There are complex cystic and solid masses in the pelvis bilaterally, which presumably arise from the ovaries, measuring 7.2 cm on the right and 10.4 cm in the midline/left ovary. There is associated mild hypermetabolism with an SUV max of 2.5 on the right and 3.4 within the  larger lesion in the midline. Incidental CT findings: Liver and adrenal glands are unremarkable. Stones are seen in the kidneys. Spleen, pancreas, stomach and bowel are grossly unremarkable. SKELETON: No abnormal osseous hypermetabolism. Incidental CT findings: None. IMPRESSION: 1. Hypermetabolic right upper lobe mass with hypermetabolic mediastinal adenopathy and multiple hypermetabolic pulmonary nodules, findings most indicative of stage IV primary bronchogenic carcinoma. 2. Complex cystic and solid ovarian masses bilaterally with mild associated hypermetabolism. Findings are worrisome for malignancy. 3. Hypermetabolic left parotid nodule. Malignancy cannot be excluded. 4. Moderate bilateral pleural effusions. 5. Bilateral renal stones. 6.  Aortic atherosclerosis (ICD10-170.0). Electronically Signed   By: Lorin Picket M.D.   On: 07/07/2018 12:06   Dg Chest Port 1 View  Result Date: 07/09/2018 CLINICAL DATA:  Cough EXAM: PORTABLE CHEST 1 VIEW COMPARISON:  07/02/2018 FINDINGS: Large right central mass and numerous bilateral pulmonary metastases, stable since prior study. Prior CABG. Cardiomegaly. No visible significant effusions or acute bony abnormality. IMPRESSION: No significant change in the right lung mass and numerous bilateral pulmonary metastases. Electronically Signed   By: Rolm Baptise M.D.   On: 07/09/2018 00:26    EKG: Independently reviewed.  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  Patient presents with seizure at home.  Found to have left cerebral masses on CT imaging. - Admit to a stepdown bed - Seizure precautions - N.p.o. - Neurochecks - Follow -up MRI of the brain - Continue Keppra 500 mg IV twice daily - Appreciate neurology consultative services, will follow-up for further recommendations - Will need to notify oncology in a.m.  Leukocytosis: WBC elevated at 17.  Suspect this is a acute stress response. - Recheck CBC in a.m.     Anemia: hemoglobin 11.4.  This appears near patient's baseline. - Recheck repeat CBC in a.m.  Hypoglycemia: While in the emergency department patient blood glucose dropped to 68.  Patient on - Hypoglycemic protocols - Hold insulin - CBGs every 4 hour - Restart/adjust insulin regimen when medically appropriate  Renal insufficiency/chronic kidney disease stage III: Acute.  Patient presents with creatinine 1.28 with BUN 36.  Elevated BUN to creatinine ratio suggest prerenal cause of symptoms. - Continue IV fluids saline at 100 mL/h - Recheck kidney function in a.m.  Chronic respiratory failure with hypoxia/metastatic lung cancer: Patient maintaining O2 saturation at home oxygen requirements.  Status post bronchoscopy on 12/4. - Continuous pulse oximetry with nasal cannula oxygen - Follow-up with specialist  History of paroxysmal atrial fibrillation: Patient currently rate controlled and not on anticoagulation at this time.  CAD: History of CABG and PCI   DVT prophylaxis: SCDs Code Status: Full Family Communication: This plan of care with the patient family present at bedside Disposition Plan: To be determined Consults called: Neurology Admission status: Inpatient  Norval Morton MD [facility] Pager [phone]   If 7PM-7AM, please contact night-coverage [email or web address] Password [email or web address]  07/09/2018, 2:31 AM

## 2018-07-09 NOTE — Progress Notes (Signed)
Pt. being transferred to rm. [id number] at [facility]- report called to [facility]. Pt. resting in bed- friends at bedside. No problems noted.

## 2018-07-09 NOTE — ED Notes (Signed)
Bladder scan completed, 337 ml resulted.

## 2018-07-10 ENCOUNTER — Encounter (HOSPITAL_COMMUNITY): Payer: Self-pay | Admitting: *Deleted

## 2018-07-10 ENCOUNTER — Ambulatory Visit (HOSPITAL_COMMUNITY)
Admit: 2018-07-10 | Discharge: 2018-07-10 | Disposition: A | Discharge status: Home / Self Care | Payer: Medicare Other | Source: Ambulatory Visit | Attending: Internal Medicine | Admitting: Internal Medicine

## 2018-07-10 ENCOUNTER — Inpatient Hospital Stay (HOSPITAL_COMMUNITY): Payer: Medicare Other

## 2018-07-10 ENCOUNTER — Telehealth: Payer: Self-pay | Admitting: Pulmonary Disease

## 2018-07-10 DIAGNOSIS — Z951 Presence of aortocoronary bypass graft: Secondary | ICD-10-CM

## 2018-07-10 DIAGNOSIS — I129 Hypertensive chronic kidney disease with stage 1 through stage 4 chronic kidney disease, or unspecified chronic kidney disease: Secondary | ICD-10-CM | POA: Insufficient documentation

## 2018-07-10 DIAGNOSIS — N189 Chronic kidney disease, unspecified: Secondary | ICD-10-CM | POA: Insufficient documentation

## 2018-07-10 DIAGNOSIS — Z87891 Personal history of nicotine dependence: Secondary | ICD-10-CM

## 2018-07-10 DIAGNOSIS — G8929 Other chronic pain: Secondary | ICD-10-CM

## 2018-07-10 DIAGNOSIS — Z955 Presence of coronary angioplasty implant and graft: Secondary | ICD-10-CM | POA: Insufficient documentation

## 2018-07-10 DIAGNOSIS — C3490 Malignant neoplasm of unspecified part of unspecified bronchus or lung: Secondary | ICD-10-CM

## 2018-07-10 DIAGNOSIS — R05 Cough: Secondary | ICD-10-CM

## 2018-07-10 DIAGNOSIS — Z6841 Body Mass Index (BMI) 40.0 and over, adult: Secondary | ICD-10-CM

## 2018-07-10 DIAGNOSIS — I2510 Atherosclerotic heart disease of native coronary artery without angina pectoris: Secondary | ICD-10-CM | POA: Insufficient documentation

## 2018-07-10 DIAGNOSIS — R569 Unspecified convulsions: Secondary | ICD-10-CM

## 2018-07-10 DIAGNOSIS — E440 Moderate protein-calorie malnutrition: Secondary | ICD-10-CM

## 2018-07-10 DIAGNOSIS — D61818 Other pancytopenia: Secondary | ICD-10-CM

## 2018-07-10 DIAGNOSIS — C7931 Secondary malignant neoplasm of brain: Principal | ICD-10-CM

## 2018-07-10 LAB — TYPE AND SCREEN
ABO/RH(D): AB POS
Antibody Screen: NEGATIVE

## 2018-07-10 LAB — GLUCOSE, CAPILLARY
Glucose-Capillary: 172 mg/dL — ABNORMAL HIGH (ref 70–99)
Glucose-Capillary: 253 mg/dL — ABNORMAL HIGH (ref 70–99)
Glucose-Capillary: 132 mg/dL — ABNORMAL HIGH (ref 70–99)
Glucose-Capillary: 121 mg/dL — ABNORMAL HIGH (ref 70–99)

## 2018-07-10 LAB — ABO/RH: ABO/RH(D): AB POS

## 2018-07-10 MED ORDER — CLOPIDOGREL BISULFATE 75 MG PO TABS
75.0000 mg | ORAL_TABLET | Freq: Every day | ORAL | Status: DC
  Administered 2018-07-10 – 2018-07-16 (×7): 75 mg via ORAL
  Filled 2018-07-10 (×7): qty 1

## 2018-07-10 MED ORDER — INSULIN DETEMIR 100 UNIT/ML ~~LOC~~ SOLN
50.0000 [IU] | Freq: Two times a day (BID) | SUBCUTANEOUS | Status: DC
  Administered 2018-07-10 – 2018-07-12 (×6): 50 [IU] via SUBCUTANEOUS
  Filled 2018-07-10 (×7): qty 0.5

## 2018-07-10 MED ORDER — MORPHINE SULFATE (PF) 4 MG/ML IV SOLN
3.0000 mg | INTRAVENOUS | Status: DC | PRN
  Administered 2018-07-10: 3 mg via INTRAVENOUS
  Filled 2018-07-10: qty 1

## 2018-07-10 MED ORDER — LEVETIRACETAM 500 MG PO TABS
500.0000 mg | ORAL_TABLET | Freq: Two times a day (BID) | ORAL | Status: DC
  Administered 2018-07-10 – 2018-07-16 (×14): 500 mg via ORAL
  Filled 2018-07-10 (×14): qty 1

## 2018-07-10 MED ORDER — LORAZEPAM 2 MG/ML IJ SOLN
1.0000 mg | INTRAMUSCULAR | Status: DC | PRN
  Administered 2018-07-10 – 2018-07-11 (×2): 2 mg via INTRAVENOUS
  Filled 2018-07-10 (×2): qty 1

## 2018-07-10 MED ORDER — SODIUM CHLORIDE 0.9 % IV SOLN
INTRAVENOUS | Status: AC
  Administered 2018-07-10: 15:00:00 via INTRAVENOUS

## 2018-07-10 MED ORDER — DEXAMETHASONE SODIUM PHOSPHATE 10 MG/ML IJ SOLN: 10.0000 mg | Freq: Four times a day (QID) | INTRAMUSCULAR | Status: DC

## 2018-07-10 MED ORDER — DEXAMETHASONE SODIUM PHOSPHATE 10 MG/ML IJ SOLN
4.0000 mg | Freq: Four times a day (QID) | INTRAMUSCULAR | Status: DC
  Administered 2018-07-10: 4 mg via INTRAVENOUS
  Filled 2018-07-10 (×2): qty 0.4

## 2018-07-10 MED ORDER — ROSUVASTATIN CALCIUM 10 MG PO TABS
10.0000 mg | ORAL_TABLET | Freq: Every day | ORAL | Status: DC
  Administered 2018-07-10 – 2018-07-16 (×7): 10 mg via ORAL
  Filled 2018-07-10 (×7): qty 1

## 2018-07-10 MED ORDER — HYDRALAZINE HCL 25 MG PO TABS
25.0000 mg | ORAL_TABLET | Freq: Every day | ORAL | Status: DC | PRN
  Administered 2018-07-10 – 2018-07-12 (×2): 25 mg via ORAL
  Filled 2018-07-10 (×3): qty 1

## 2018-07-10 MED ORDER — HYDROCODONE-ACETAMINOPHEN 5-325 MG PO TABS
1.0000 | ORAL_TABLET | Freq: Four times a day (QID) | ORAL | Status: DC | PRN
  Administered 2018-07-11 – 2018-07-12 (×2): 1 via ORAL
  Filled 2018-07-10 (×2): qty 1

## 2018-07-10 MED ORDER — ASPIRIN EC 81 MG PO TBEC
81.0000 mg | DELAYED_RELEASE_TABLET | Freq: Every day | ORAL | Status: DC
  Administered 2018-07-10 – 2018-07-16 (×7): 81 mg via ORAL
  Filled 2018-07-10 (×7): qty 1

## 2018-07-10 MED ORDER — DEXAMETHASONE SODIUM PHOSPHATE 4 MG/ML IJ SOLN
4.0000 mg | Freq: Four times a day (QID) | INTRAMUSCULAR | Status: DC
  Administered 2018-07-10 – 2018-07-16 (×24): 4 mg via INTRAVENOUS
  Filled 2018-07-10 (×31): qty 1

## 2018-07-10 MED ORDER — METOCLOPRAMIDE HCL 5 MG PO TABS
5.0000 mg | ORAL_TABLET | Freq: Two times a day (BID) | ORAL | Status: DC
  Administered 2018-07-10 – 2018-07-16 (×13): 5 mg via ORAL
  Filled 2018-07-10 (×13): qty 1

## 2018-07-10 MED ORDER — ALBUTEROL SULFATE (2.5 MG/3ML) 0.083% IN NEBU
3.0000 mL | INHALATION_SOLUTION | Freq: Four times a day (QID) | RESPIRATORY_TRACT | Status: DC | PRN
  Administered 2018-07-14: 3 mL via RESPIRATORY_TRACT
  Filled 2018-07-10: qty 3

## 2018-07-10 NOTE — Consult Note (Signed)
Consultation Note Date: 07/10/2018   Patient Name: [name]  DOB: May 16, 1946  MRN: [id number]  Age / Sex: 72 y.o., female  PCP: Susy Frizzle, MD Referring Physician: Aileen Fass, Tammi Klippel, MD  Reason for Consultation: Establishing goals of care  HPI/Patient Profile: 72 y.o. female  with past medical history of retention, paroxysmal A. Fib, newly diagnosed small cell lung cancer, COPD, diabetes, chronic kidney disease admitted on 07/08/2018 following a seizure at home.  CT revealed metastatic lesions but she has not yet had staging MRI as she could not complete it as an outpatient secondary to neck pain.  Palliative consulted for goals of care.  Clinical Assessment and Goals of Care: I met today with [name].  She was alone in the room at the time of my encounter  She reports the most important things to her are her friends, her faith, and being in her own home.  She has a long-term friend, [name], whom she has lived with for 27 years.  They also have another friend named [name] living with them at this time as well.  [name] reports that [name] has been her long-term support and that they are just friends despite people often confusing him for her husband or son.  She enjoys reading and watching television, particularly gospel shows.  She reports that the doctors have been doing a good job explaining things to her, and she reports understanding that she has significant cancer with brain lesions.  She states that getting MRI completed for staging is the most important thing, and she is also anxious to talk to radiation oncology once this MRI is completed.  We talked about the goal of medical interventions to be adding as much time in quality to her life as possible.  She is certainly invested in plan to complete staging work-up and begin recommended disease modifying therapy including any chemotherapy,  radiation, or immunotherapy that may be recommended.  We discussed also the need to begin consideration for further advanced care planning including who ought to make decisions if she is unable to make them for herself and any limitations on the care that she got to receive moving forward.  She had completed a healthcare power of attorney document and there is a copy scanned to the chart.  She reports that this is actually no longer up-to-date as her friend, [name], would remain her primary surrogate decision-maker, but she would like to name another friend [name]) as secondary decision-maker rather than [name] who is currently named and the document on file.  I also began gentle initial discussions regarding CODE STATUS and any limitations on care moving forward.  We discussed the goal of any medical interventions being to get her well enough to return to her home.  I briefly mentioned concern that aggressive interventions in the event of cardiac or respiratory arrest are not likely to result in her recovering to her prior functional status and living independently based on experience of others in similar circumstances.  She will  discuss this further with her friend, [name].  SUMMARY OF RECOMMENDATIONS   - FULL CODE/FULL SCOPE treatment - Await MRI for staging and radiation oncology input - Will request spiritual care to complete new advance directive (first surrogate to remain the same, but she would like to name a different backup surrogate) - PMT to continue to follow.  She is open to another meeting in conjunctions with [name] early next week once she has gotten more information (completed MRI, discussed with rad onc)  Code Status/Advance Care Planning:  Full code   Symptom Management:   Denies complaints.  Per primary.  Palliative Prophylaxis:   Delirium Protocol and Frequent Pain Assessment  Additional Recommendations (Limitations, Scope, Preferences):  Full Scope  Treatment  Psycho-social/Spiritual:   Desire for further Chaplaincy support:yes  Additional Recommendations: Would like to complete new HCPOA document  Prognosis:   Unable to determine  Discharge Planning: To Be Determined      Primary Diagnoses: Present on Admission: . AF (paroxysmal atrial fibrillation) ([place]) . Hypoglycemia due to insulin . CAD (coronary artery disease) CABG x5 2011 . Leukocytosis . Brain metastases ([name]) . Lung cancer ([name]) . Type 2 diabetes mellitus with chronic kidney disease ([place])   I have reviewed the medical record, interviewed the patient and family, and examined the patient. The following aspects are pertinent.  Past Medical History:  Diagnosis Date  . Atrial fibrillation (HCC)    paroxysmal  . CAD (coronary artery disease)    X5  . Cancer ([place])    lung  . Chronic kidney disease   . Diabetes mellitus   . Gastritis   . GI bleeding   . Hiatal hernia   . Hyperglycemia   . Hyperlipidemia   . Hypertension   . Mediastinal adenopathy    and hilar  . Obesity   . Obstructive sleep apnea    does not wear CPAP  . Osteopenia   . Oxygen dependent   . Peripheral artery disease (HCC)    lower extremities  . Pneumonia   . Stroke ([name])   . Systolic murmur   . Ulcer   . Vitamin D deficiency   . Wears dentures    Social History   Socioeconomic History  . Marital status: Single    Spouse name: Not on file  . Number of children: 0  . Years of education: Not on file  . Highest education level: Not on file  Occupational History  . Occupation: Retired  Scientific laboratory technician  . Financial resource strain: Not on file  . Food insecurity:    Worry: Not on file    Inability: Not on file  . Transportation needs:    Medical: Not on file    Non-medical: Not on file  Tobacco Use  . Smoking status: Former Smoker    Packs/day: 1.00    Years: 52.00    Pack years: 52.00    Start date: 08/04/1957    Last attempt to quit: 01/22/2010    Years since  quitting: 8.4  . Smokeless tobacco: Never Used  Substance and Sexual Activity  . Alcohol use: No  . Drug use: No  . Sexual activity: Not Currently  Lifestyle  . Physical activity:    Days per week: Not on file    Minutes per session: Not on file  . Stress: Not on file  Relationships  . Social connections:    Talks on phone: Not on file    Gets together: Not on file  Attends religious service: Not on file    Active member of club or organization: Not on file    Attends meetings of clubs or organizations: Not on file    Relationship status: Not on file  Other Topics Concern  . Not on file  Social History Narrative  . Not on file   Family History  Problem Relation Age of Onset  . Cancer Mother        Did not know what kind  . COPD Brother   . Diabetes Sister   . Heart disease Brother   . Colon cancer Neg Hx   . Colon polyps Neg Hx   . Gallbladder disease Neg Hx   . Kidney disease Neg Hx   . Esophageal cancer Neg Hx    Scheduled Meds: . aspirin EC  81 mg Oral Daily  . clopidogrel  75 mg Oral Daily  . dexamethasone  4 mg Intravenous Q6H  . insulin aspart  0-15 Units Subcutaneous TID WC  . insulin aspart  0-5 Units Subcutaneous QHS  . insulin detemir  50 Units Subcutaneous BID  . ipratropium-albuterol  3 mL Nebulization Q6H  . levETIRAcetam  500 mg Oral BID  . metoCLOPramide  5 mg Oral BID  . pantoprazole  40 mg Oral Daily  . polyethylene glycol  17 g Oral Daily  . rosuvastatin  10 mg Oral Daily  . sodium chloride flush  3 mL Intravenous Q12H  . sodium polystyrene  15 g Oral Once   Continuous Infusions: . sodium chloride     PRN Meds:.acetaminophen **OR** acetaminophen, albuterol, hydrALAZINE, HYDROcodone-acetaminophen, LORazepam, morphine injection, ondansetron **OR** ondansetron (ZOFRAN) IV Medications Prior to Admission:  Prior to Admission medications   Medication Sig Start Date End Date Taking? Authorizing Provider  acetaminophen (TYLENOL) 325 MG tablet  Take 650 mg by mouth every 6 (six) hours as needed for mild pain or headache.    Yes [provider]  albuterol (PROVENTIL HFA;VENTOLIN HFA) 108 (90 Base) MCG/ACT inhaler Inhale 2 puffs into the lungs every 6 (six) hours as needed for wheezing or shortness of breath. 06/19/18  Yes Amin, Jeanella Flattery, MD  ALPRAZolam (XANAX) 1 MG tablet Take 0.5 tablets (0.5 mg total) by mouth 3 (three) times daily as needed for anxiety. 06/30/18  Yes Diane, Modena Nunnery, MD  amLODipine (NORVASC) 10 MG tablet Take 10 mg by mouth daily.     Yes [provider]  aspirin 81 MG tablet Take 81 mg by mouth daily.    Yes [provider]  clopidogrel (PLAVIX) 75 MG tablet Take 1 tablet (75 mg total) by mouth daily. 08/05/17  Yes Lorretta Harp, MD  furosemide (LASIX) 40 MG tablet Take 1 tablet (40 mg total) by mouth daily. 08/05/17  Yes Lorretta Harp, MD  hydrALAZINE (APRESOLINE) 25 MG tablet Take 1 tablet (25 mg total) by mouth daily as needed. Patient taking differently: Take 25 mg by mouth daily.  08/05/17  Yes Lorretta Harp, MD  HYDROcodone-acetaminophen (NORCO) 5-325 MG tablet Take 1 tablet by mouth every 6 (six) hours as needed for moderate pain. 06/25/18  Yes Pickard, Cammie Mcgee, MD  LEVEMIR FLEXTOUCH 100 UNIT/ML Pen INJECT 80 UNITS INTO THE SKIN 2 TIMES DAILY. Patient taking differently: Inject 50 Units into the skin 2 (two) times daily.  05/03/18  Yes Susy Frizzle, MD  metoCLOPramide (REGLAN) 5 MG tablet TAKE 1 TABLET BY MOUTH 3 TIMES A DAY BEFORE MEALS AND 1 AT BEDTIME Patient taking differently:  Take 5 mg by mouth 2 (two) times daily.  06/19/17  Yes Susy Frizzle, MD  pantoprazole (PROTONIX) 40 MG tablet TAKE 1 TABLET BY MOUTH EVERY DAY Patient taking differently: Take 40 mg by mouth daily.  08/07/17  Yes Susy Frizzle, MD  phenylephrine (SUDAFED PE) 10 MG TABS tablet Take 10 mg by mouth 2 (two) times daily.    Yes [provider]  potassium chloride SA  (K-DUR,KLOR-CON) 20 MEQ tablet Take 1 tablet (20 mEq total) by mouth daily. 07/02/18  Yes Sherwood Gambler, MD  promethazine (PHENERGAN) 25 MG tablet Take 1 tablet (25 mg total) by mouth every 8 (eight) hours as needed for nausea or vomiting. 06/30/18  Yes Susy Frizzle, MD  rosuvastatin (CRESTOR) 10 MG tablet Take 1 tablet (10 mg total) by mouth daily. 06/25/18  Yes Pickard, Cammie Mcgee, MD  TRADJENTA 5 MG TABS tablet TAKE 1 TABLET BY MOUTH EVERY DAY Patient taking differently: Take 5 mg by mouth daily.  04/29/18  Yes Susy Frizzle, MD  B-D ULTRAFINE III SHORT PEN 31G X 8 MM MISC USE AS DIRECTED 05/11/18   Susy Frizzle, MD  [employer] VITAMIN D 2000 units CAPS TAKE ONE CAPSULE BY MOUTH DAILY Patient taking differently: Take 2,000 Units by mouth daily at 2 PM.  12/19/16   Susy Frizzle, MD  lisinopril (PRINIVIL,ZESTRIL) 20 MG tablet TAKE 1 TABLET (20 MG TOTAL) BY MOUTH DAILY. 12/29/17   Susy Frizzle, MD  niacin (NIASPAN) 750 MG CR tablet TAKE 1 TABLET BY MOUTH EVERY DAY AT BEDTIME Patient not taking: No sig reported 10/27/17   Susy Frizzle, MD  silver sulfADIAZINE (SILVADENE) 1 % cream Apply 1 application topically daily. Patient not taking: Reported on 07/09/2018 06/25/18   Susy Frizzle, MD   Allergies  Allergen Reactions  . Neosporin [Neomycin-Polymyxin-Gramicidin] Hives, Swelling and Rash  . Latex Rash  . Sulfa Antibiotics Nausea And Vomiting   Review of Systems  Constitutional: Positive for activity change.  Musculoskeletal: Positive for neck pain.  Neurological: Positive for weakness.  Psychiatric/Behavioral: Positive for sleep disturbance.   Physical Exam General: Alert, awake, in no acute distress.  HEENT: No bruits, no goiter, no JVD Heart: Regular rate and rhythm. No murmur appreciated. Lungs: Good air movement, clear Abdomen: Soft, nontender, nondistended, positive bowel sounds.  Ext: No significant edema Skin: Warm and dry Neuro: Grossly intact,  nonfocal.   Vital Signs: BP (!) 164/54 (BP Location: Right Wrist)   Pulse 75   Temp 98.2 F (36.8 C) (Oral)   Resp 20   Ht '4\' 10"'  (1.473 m)   Wt 90.5 kg   SpO2 94%   BMI 41.70 kg/m  Pain Scale: 0-10   Pain Score: 0-No pain   SpO2: SpO2: 94 % O2 Device:SpO2: 94 % O2 Flow Rate: .O2 Flow Rate (L/min): 3 L/min  IO: Intake/output summary:   Intake/Output Summary (Last 24 hours) at 07/10/2018 1155 Last data filed at 07/10/2018 0600 Gross per 24 hour  Intake 483 ml  Output 1000 ml  Net -517 ml    LBM: Last BM Date: 07/09/18 Baseline Weight: Weight: 91.6 kg Most recent weight: Weight: 90.5 kg     Palliative Assessment/Data:   Flowsheet Rows     Most Recent Value  Intake Tab  Referral Department  Hospitalist  Unit at Time of Referral  Med/Surg Unit  Palliative Care Primary Diagnosis  Cancer  Date Notified  07/09/18  Palliative Care Type  New Palliative care  Reason for referral  Clarify Goals of Care  Date of Admission  07/08/18  Date first seen by Palliative Care  07/10/18  # of days Palliative referral response time  1 Day(s)  # of days IP prior to Palliative referral  1  Clinical Assessment  Palliative Performance Scale Score  50%  Pain Max last 24 hours  8  Pain Min Last 24 hours  2  Dyspnea Max Last 24 Hours  3  Dyspnea Min Last 24 hours  0  Psychosocial & Spiritual Assessment  Palliative Care Outcomes  Patient/Family meeting held?  Yes  Who was at the meeting?  Patient  Palliative Care Outcomes  Clarified goals of care, ACP counseling assistance      Time In: 1020 Time Out: 1130 Time Total: 70 Greater than 50%  of this time was spent counseling and coordinating care related to the above assessment and plan.  Signed by: Micheline Rough, MD   Please contact Palliative Medicine Team phone at [phone] for questions and concerns.  For individual provider: See Shea Evans

## 2018-07-10 NOTE — Telephone Encounter (Signed)
PCCM:  Patient was recently admitted to the hospital on Thursday with strokelike symptoms.  I called and spoke with the patient's friend [name].  Patient has been updated on positive malignancy confirmation via pathology.  Has already established care with oncology in the hospital at [facility].  Pulmonary will be available with any questions or concerns.  Do not hesitate to call.  Garner Nash, DO [facility] 07/10/2018 7:03 PM

## 2018-07-10 NOTE — Progress Notes (Signed)
[facility] PROGRESS NOTE    Progress Note  [name]  IRW:[id number] DOB: 08-23-1945 DOA: 07/08/2018 PCP: Susy Frizzle, MD     Brief Narrative:   [name] is an 72 y.o. female female with past medical history of hypertension paroxysmal atrial, with a history of recently diagnosed small cell lung cancer fibrillation COPD on oxygen diabetes mellitus type 2 chronic kidney disease who presents after witnessed seizure  Assessment/Plan:   Poorly differentiated lung cancer in adult/ Brain metastases (HCC)/Seizure [facility]): Decrease Decadron to IV 4 mg every 6 hours, MRI of the brain is pending. With narcotics for pain control. Oncology has been already consulted. Continue Ativan as needed for seizures, she has been loaded with Keppra continue Keppra orally.  Hyperkalemia: Potassium continues to be high, she is not receiving her dose of Kayexalate she did have a bowel movement. Recheck basic metabolic panel in the morning. Started on IV fluids. Discontinue lisinopril.  History of atrial fibrillation: Rate controlled continue current medication.  leuCytosis: Likely due to malignancy.  Normocytic anemia: Close to baseline.  Acute kidney injury on chronic kidney disease stage III: She was started on IV fluids, with slight improvement in creatinine.  Her bicarb is continues to be 39 her chloride 97, will continue IV fluids.  Obesity: Estimated body mass index is 41.7 kg/m as calculated from the following:   Height as of this encounter: 4\' 10"  (1.473 m).   Weight as of this encounter: 90.5 kg.  DVT prophylaxis: lovenox Family Communication:husband Disposition Plan/Barrier to D/C: once radiation started Code Status:     Code Status Orders  (From admission, onward)         Start     Ordered   07/09/18 0253  Full code  Continuous     07/09/18 0258        Code Status History    Date Active Date Inactive Code Status Order ID Comments User Context   06/18/2018 0318 06/19/2018 1835 Full Code [id number]  Shela Leff, MD Inpatient        IV Access:    Peripheral IV   Procedures and diagnostic studies:   Ct Head Wo Contrast  Result Date: 07/08/2018 CLINICAL DATA:  72 year old female with seizure like activity and encephalopathy. History of lung cancer. EXAM: CT HEAD WITHOUT CONTRAST TECHNIQUE: Contiguous axial images were obtained from the base of the skull through the vertex without intravenous contrast. COMPARISON:  Head CT dated 01/19/2010 FINDINGS: Brain: There is a 12 x 13 mm high attenuating mass in the left frontal lobe with surrounding edema. An additional 12 x 12 mm high attenuating mass in the left posterior frontal convexity along the falx noted with mild surrounding edema. There is a large area of encephalomalacia in the right frontoparietal region. There is otherwise mild age-related atrophy and chronic microvascular ischemic changes. There is no acute intracranial hemorrhage. No midline shift. Vascular: No hyperdense vessel or unexpected calcification. Skull: Normal. Negative for fracture or focal lesion. Sinuses/Orbits: No acute finding. Other: None IMPRESSION: 1. No acute intracranial hemorrhage. 2. Left cerebral masses as described most consistent with metastatic disease. Further evaluation with MRI recommended. 3. Large area of encephalomalacia in the right frontoparietal region. Electronically Signed   By: Anner Crete M.D.   On: 07/08/2018 23:58   Dg Chest Port 1 View  Result Date: 07/09/2018 CLINICAL DATA:  Cough EXAM: PORTABLE CHEST 1 VIEW COMPARISON:  07/02/2018 FINDINGS: Large right central mass and numerous bilateral pulmonary metastases, stable since prior  study. Prior CABG. Cardiomegaly. No visible significant effusions or acute bony abnormality. IMPRESSION: No significant change in the right lung mass and numerous bilateral pulmonary metastases. Electronically Signed   By: Rolm Baptise M.D.   On: 07/09/2018  00:26     Medical Consultants:    None.  Anti-Infectives:   None  Subjective:    [name] she relates she feels better than yesterday no seizures overnight. Objective:    Vitals:   07/09/18 2219 07/10/18 0054 07/10/18 0222 07/10/18 0534  BP: (!) 168/75   (!) 164/54  Pulse: 75   75  Resp: 20   20  Temp: 98.5 F (36.9 C)   98.2 F (36.8 C)  TempSrc: Oral   Oral  SpO2: 96%  94% 95%  Weight:  90.5 kg    Height:  4\' 10"  (1.473 m)      Intake/Output Summary (Last 24 hours) at 07/10/2018 0845 Last data filed at 07/10/2018 0600 Gross per 24 hour  Intake 483 ml  Output 1100 ml  Net -617 ml   Filed Weights   07/08/18 2306 07/10/18 0054  Weight: 91.6 kg 90.5 kg    Exam: General exam: In no acute distress. Respiratory system: Good air movement and clear to auscultation. Cardiovascular system: S1 & S2 heard, RRR. No JVD, murmurs, rubs, gallops or clicks.  Gastrointestinal system: Abdomen is nondistended, soft and nontender.  Central nervous system: Alert and oriented. No focal neurological deficits. Extremities: No pedal edema. Skin: No rashes, lesions or ulcers Psychiatry: Judgement and insight appear normal. Mood & affect appropriate.    Data Reviewed:    Labs: Basic Metabolic Panel: Recent Labs  Lab 07/08/18 2325 07/08/18 2331 07/09/18 0536 07/09/18 1855  NA 144 140 144  --   K 4.9 4.7 5.3* 5.5*  CL 94* 93* 97*  --   CO2 39*  --  39*  --   GLUCOSE 98 96 99  --   BUN 36* 37* 34*  --   CREATININE 1.28* 1.20* 1.17*  --   CALCIUM 8.8*  --  8.3*  --    GFR Estimated Creatinine Clearance: 41 mL/min (A) (by C-G formula based on SCr of 1.17 mg/dL (H)). Liver Function Tests: Recent Labs  Lab 07/08/18 2325 07/09/18 0536  AST 19 19  ALT 14 15  ALKPHOS 53 49  BILITOT 0.4 0.3  PROT 6.4* 5.9*  ALBUMIN 2.7* 2.5*   No results for input(s): LIPASE, AMYLASE in the last 168 hours. No results for input(s): AMMONIA in the last 168 hours. Coagulation  profile Recent Labs  Lab 07/07/18 0935 07/08/18 2325  INR 1.02 0.95    CBC: Recent Labs  Lab 07/08/18 2325 07/08/18 2331 07/09/18 0420  WBC 17.0*  --  11.8*  NEUTROABS 14.8*  --   --   HGB 11.4* 12.6 8.9*  HCT 39.1 37.0 30.8*  MCV 98.0  --  99.7  PLT 173  --  140*   Cardiac Enzymes: No results for input(s): CKTOTAL, CKMB, CKMBINDEX, TROPONINI in the last 168 hours. BNP (last 3 results) No results for input(s): PROBNP in the last 8760 hours. CBG: Recent Labs  Lab 07/09/18 0751 07/09/18 1249 07/09/18 1651 07/09/18 2231 07/10/18 0716  GLUCAP 181* 212* 259* 127* 172*   D-Dimer: No results for input(s): DDIMER in the last 72 hours. Hgb A1c: No results for input(s): HGBA1C in the last 72 hours. Lipid Profile: No results for input(s): CHOL, HDL, LDLCALC, TRIG, CHOLHDL, LDLDIRECT in the last  72 hours. Thyroid function studies: No results for input(s): TSH, T4TOTAL, T3FREE, THYROIDAB in the last 72 hours.  Invalid input(s): FREET3 Anemia work up: No results for input(s): VITAMINB12, FOLATE, FERRITIN, TIBC, IRON, RETICCTPCT in the last 72 hours. Sepsis Labs: Recent Labs  Lab 07/08/18 2325 07/09/18 0420  WBC 17.0* 11.8*   Microbiology No results found for this or any previous visit (from the past 240 hour(s)).   Medications:   . dexamethasone  10 mg Intravenous Q6H  . insulin aspart  0-15 Units Subcutaneous TID WC  . insulin aspart  0-5 Units Subcutaneous QHS  . ipratropium-albuterol  3 mL Nebulization Q6H  . pantoprazole  40 mg Oral Daily  . polyethylene glycol  17 g Oral Daily  . sodium chloride flush  3 mL Intravenous Q12H  . sodium polystyrene  15 g Oral Once   Continuous Infusions: . sodium chloride      LOS: 1 day   [name]  [facility]   *Please refer to [email or web address], password [email or web address] to get updated schedule on who will round on this patient, as hospitalists switch teams weekly. If 7PM-7AM, please contact night-coverage at  [email or web address], password [email or web address] for any overnight needs.  07/10/2018, 8:45 AM

## 2018-07-10 NOTE — Progress Notes (Signed)
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 We will try to get MRI of the brain with and without contrast with 3T imaging Continue IV dexamethasone and IV Keppra for seizures prevention I will consult radiation oncologist to plan palliative radiation therapy to the brain  Chronic pain Recommend IV pain medicine to keep her comfortable so that she can complete MRI of the brain  Chronic cough Likely related to her cancer. Continue oxygen therapy Could be also due to COPD exacerbation  Mild pancytopenia Could be due to hydration/hemodilution Monitor closely  Elevated potassium with stable CKD Could be due to spontaneous tumor lysis We will check uric acid and LDH level tomorrow  Moderate protein calorie malnutrition Could be due to untreated cancer Monitor closely  Discharge planning Not ready for discharge for the next few days until she can get her treatment planned I will return tomorrow to check on her  I spent 60 minutes counseling the patient face to face. The total time spent in the appointment was 70 minutes and more than 50% was on counseling.  All questions were answered. The patient knows to call the clinic with any problems, questions or concerns.  Heath Lark, MD 07/10/2018 8:35 AM  CHIEF COMPLAINTS/PURPOSE OF CONSULTATION:  Stage IV metastatic small cell lung cancer, admitted for seizures  HISTORY OF PRESENTING ILLNESS:  [name] 72 y.o. female is seen at the request of hospitalist yesterday due to a  patient who was admitted through the emergency department after presentation with seizures, on background history of recent diagnosis of stage IV small cell lung cancer.  Her close friend and her significant other were present in the room. The patient reported history of fall approximately a month ago.  She has chronic neck pain.  No recent new neurological deficits, speech disturbances or visual difficulties.  The patient had background history of stroke but denies chronic neurological deficits. She presented with witnessed seizure yesterday and was brought in the emergency department.  CT imaging of the head showed intracranial lesions.  She was transferred to [facility] for further management  She has recurrent admissions since last month after presentation with respiratory failure.  She has background history of significant smoking. Imaging study from last month showed large pleural effusion with numerous lung nodules, suspicious for malignancy. Cytology from thoracentesis was nondiagnostic.  She underwent PET CT scan which showed diffuse metastatic disease concerning for primary lung cancer.  The patient was not able to undergo outpatient MRI of the brain due to chronic neck pain.  On July 07, 2018, she underwent bronchoscopy with biopsy.  During the procedure, it was noted that the patient had extrinsic compression of the right lower lobe along with significant thick mucus secretion throughout.  Biopsy were taken.  Bronchoalveolar lavage was also performed.  According to pathology report, differentiated carcinoma was seen.  There is abundant necrosis with malignant cells that exhibit neuroendocrine appearing chromatin and nuclear molding. Immunohistochemistry is positive for TTF-1. CD56 and p63 are weakly positive. Chromogranin, synaptophysin and cytokeratin 5/6 are negative. While  the immunohistochemistry is somewhat inconclusive, the morphology is consistent with a small cell  carcinoma.  The patient missed her outpatient appointment to see Dr. Julien Nordmann yesterday due to admission.  Since her recent bronchoscopy, she has a more productive cough with occasional hemoptysis. She denies recent fever or chills. Her appetite is stable.  No changes in weight.  No recent changes in bowel habits.  She has chronic neck pain but no new bone pain.  MEDICAL HISTORY:  Past Medical History:  Diagnosis Date  . Atrial fibrillation (HCC)    paroxysmal  . CAD (coronary artery disease)    X5  . Cancer ([place])    lung  . Chronic kidney disease   . Diabetes mellitus   . Gastritis   . GI bleeding   . Hiatal hernia   . Hyperglycemia   . Hyperlipidemia   . Hypertension   . Mediastinal adenopathy    and hilar  . Obesity   . Obstructive sleep apnea    does not wear CPAP  . Osteopenia   . Oxygen dependent   . Peripheral artery disease (HCC)    lower extremities  . Pneumonia   . Stroke ([place])   . Systolic murmur   . Ulcer   . Vitamin D deficiency   . Wears dentures     SURGICAL HISTORY: Past Surgical History:  Procedure Laterality Date  . ANGIOPLASTY / STENTING FEMORAL Right    leg  . BREAST SURGERY     biopsy  . Carotid doppler  10/13/2011   right ICA-49% diameter reduction and left ICA with 50%-69% diameter reduction  . CATARACT EXTRACTION W/ INTRAOCULAR LENS  IMPLANT, BILATERAL    . CHOLECYSTECTOMY    . COLONOSCOPY W/ BIOPSIES AND POLYPECTOMY    . CORONARY ARTERY BYPASS GRAFT  05/22/2010  . Heart bypass    . Myoview Stress  08/13/2010   inferolateral scar without ischemia  . VIDEO BRONCHOSCOPY WITH ENDOBRONCHIAL ULTRASOUND N/A 07/07/2018   Procedure: VIDEO BRONCHOSCOPY WITH ENDOBRONCHIAL ULTRASOUND;  Surgeon: Garner Nash, DO;  Location: [place] [facility];  Service: Thoracic;  Laterality: N/A;    SOCIAL HISTORY: Social History   Socioeconomic History  . Marital status: Single    Spouse name: Not on file  . Number of children: 0  . Years of education: Not on file   . Highest education level: Not on file  Occupational History  . Occupation: Retired  Scientific laboratory technician  . Financial resource strain: Not on file  . Food insecurity:    Worry: Not on file    Inability: Not on file  . Transportation needs:    Medical: Not on file    Non-medical: Not on file  Tobacco Use  . Smoking status: Former Smoker    Packs/day: 1.00    Years: 52.00    Pack years: 52.00    Start date: 08/04/1957    Last attempt to quit: 01/22/2010    Years since quitting: 8.4  . Smokeless tobacco: Never Used  Substance and Sexual Activity  . Alcohol use: No  . Drug use: No  . Sexual activity: Not Currently  Lifestyle  . Physical activity:    Days per week: Not on file    Minutes per session: Not on file  . Stress: Not on file  Relationships  . Social connections:    Talks on phone: Not on file    Gets together: Not on file    Attends religious service: Not on file  Active member of club or organization: Not on file    Attends meetings of clubs or organizations: Not on file    Relationship status: Not on file  . Intimate partner violence:    Fear of current or ex partner: Not on file    Emotionally abused: Not on file    Physically abused: Not on file    Forced sexual activity: Not on file  Other Topics Concern  . Not on file  Social History Narrative  . Not on file    FAMILY HISTORY: Family History  Problem Relation Age of Onset  . Cancer Mother        Did not know what kind  . COPD Brother   . Diabetes Sister   . Heart disease Brother   . Colon cancer Neg Hx   . Colon polyps Neg Hx   . Gallbladder disease Neg Hx   . Kidney disease Neg Hx   . Esophageal cancer Neg Hx     ALLERGIES:  is allergic to neosporin [neomycin-polymyxin-gramicidin]; latex; and sulfa antibiotics.  MEDICATIONS:  Current Facility-Administered Medications  Medication Dose Route Frequency Provider Last Rate Last Dose  . acetaminophen (TYLENOL) tablet 650 mg  650 mg Oral Q6H PRN  Hosie Poisson, MD       Or  . acetaminophen (TYLENOL) suppository 650 mg  650 mg Rectal Q6H PRN Hosie Poisson, MD      . dexamethasone (DECADRON) injection 10 mg  10 mg Intravenous Q6H Hosie Poisson, MD   10 mg at 07/10/18 0523  . insulin aspart (novoLOG) injection 0-15 Units  0-15 Units Subcutaneous TID WC Hosie Poisson, MD   3 Units at 07/10/18 0744  . insulin aspart (novoLOG) injection 0-5 Units  0-5 Units Subcutaneous QHS Hosie Poisson, MD      . ipratropium-albuterol (DUONEB) 0.5-2.5 (3) MG/3ML nebulizer solution 3 mL  3 mL Nebulization Q6H Hosie Poisson, MD   3 mL at 07/10/18 0222  . LORazepam (ATIVAN) injection 1-2 mg  1-2 mg Intravenous Q2H PRN Hosie Poisson, MD      . morphine 4 MG/ML injection 3 mg  3 mg Intravenous Q4H PRN [name], MD      . ondansetron [facility]) tablet 4 mg  4 mg Oral Q6H PRN Hosie Poisson, MD       Or  . ondansetron (ZOFRAN) injection 4 mg  4 mg Intravenous Q6H PRN Hosie Poisson, MD      . pantoprazole (PROTONIX) EC tablet 40 mg  40 mg Oral Daily Hosie Poisson, MD   40 mg at 07/09/18 1046  . polyethylene glycol (MIRALAX / GLYCOLAX) packet 17 g  17 g Oral Daily Hosie Poisson, MD      . sodium chloride flush (NS) 0.9 % injection 3 mL  3 mL Intravenous Q12H Hosie Poisson, MD   3 mL at 07/10/18 0012  . sodium polystyrene (KAYEXALATE) 15 GM/60ML suspension 15 g  15 g Oral Once Hosie Poisson, MD        REVIEW OF SYSTEMS:   Constitutional: Denies fevers, chills or abnormal night sweats Eyes: Denies blurriness of vision, double vision or watery eyes Ears, nose, mouth, throat, and face: Denies mucositis or sore throat Cardiovascular: Denies palpitation, chest discomfort or lower extremity swelling Gastrointestinal:  Denies nausea, heartburn or change in bowel habits Skin: Denies abnormal skin rashes Lymphatics: Denies new lymphadenopathy or easy bruising Behavioral/Psych: Mood is stable, no new changes  All other systems were reviewed with the patient and  are  negative.  PHYSICAL EXAMINATION: ECOG PERFORMANCE STATUS: 1 - Symptomatic but completely ambulatory  Vitals:   07/10/18 0222 07/10/18 0534  BP:  (!) 164/54  Pulse:  75  Resp:  20  Temp:  98.2 F (36.8 C)  SpO2: 94% 95%   Filed Weights   07/08/18 2306 07/10/18 0054  Weight: 201 lb 15.8 oz (91.6 kg) 199 lb 8.3 oz (90.5 kg)    GENERAL:alert, no distress and comfortable.  She has oxygen delivered via nasal cannula SKIN: skin color, texture, turgor are normal, no rashes or significant lesions EYES: normal, conjunctiva are pink and non-injected, sclera clear OROPHARYNX:no exudate, no erythema and lips, buccal mucosa, and tongue normal  NECK: supple, thyroid normal size, non-tender, without nodularity LUNGS: Diffuse wheezes throughout with mild increased respiratory effort LYMPH:  no palpable lymphadenopathy in the cervical, axillary or inguinal HEART: regular rate & rhythm and no murmurs and no lower extremity edema ABDOMEN:abdomen soft, non-tender and normal bowel sounds Musculoskeletal:no cyanosis of digits and no clubbing  PSYCH: alert & oriented x 3 with fluent speech NEURO: no focal motor/sensory deficits  LABORATORY DATA:  I have reviewed the data as listed Lab Results  Component Value Date   WBC 11.8 (H) 07/09/2018   HGB 8.9 (L) 07/09/2018   HCT 30.8 (L) 07/09/2018   MCV 99.7 07/09/2018   PLT 140 (L) 07/09/2018   Recent Labs    07/02/18 1143 07/08/18 2325 07/08/18 2331 07/09/18 0536 07/09/18 1855  NA 144 144 140 144  --   K 3.6 4.9 4.7 5.3* 5.5*  CL 101 94* 93* 97*  --   CO2 34* 39*  --  39*  --   GLUCOSE 144* 98 96 99  --   BUN 12 36* 37* 34*  --   CREATININE 1.00 1.28* 1.20* 1.17*  --   CALCIUM 9.4 8.8*  --  8.3*  --   GFRNONAA 56* 41*  --  46*  --   GFRAA >60 48*  --  54*  --   PROT 6.0* 6.4*  --  5.9*  --   ALBUMIN 2.4* 2.7*  --  2.5*  --   AST 14* 19  --  19  --   ALT 12 14  --  15  --   ALKPHOS 49 53  --  49  --   BILITOT 0.2* 0.4  --  0.3   --     RADIOGRAPHIC STUDIES: I have personally reviewed the radiological images as listed and agreed with the findings in the report. Dg Chest 1 View  Result Date: 06/24/2018 CLINICAL DATA:  Status post right thoracentesis. EXAM: CHEST  1 VIEW COMPARISON:  Chest x-ray dated June 17, 2018. FINDINGS: Stable cardiomediastinal silhouette status post CABG. Innumerable bilateral pulmonary nodules are unchanged. No significant residual right pleural effusion. Unchanged trace left pleural effusion. No pneumothorax. No acute osseous abnormality. IMPRESSION: 1. No residual right pleural effusion status post thoracentesis. No pneumothorax. 2. Unchanged diffuse pulmonary metastatic disease. Electronically Signed   By: Titus Dubin M.D.   On: 06/24/2018 15:16   Dg Chest 2 View  Result Date: 07/02/2018 CLINICAL DATA:  Shortness of breath. EXAM: CHEST - 2 VIEW COMPARISON:  06/28/2018. FINDINGS: Monitoring device noted over the chest. Prior CABG. Heart size stable. Multiple bilateral pulmonary mass lesions consistent metastatic disease again noted. Stable small bilateral pleural effusions. No pneumothorax. IMPRESSION: Multiple bilateral pulmonary mass lesions consistent metastatic disease again noted. Stable small bilateral pleural effusions. Electronically Signed   By:  Del[place]   On: 07/02/2018 12:11   Dg Chest 2 View  Result Date: 06/28/2018 CLINICAL DATA:  Status post right-sided thoracentesis on June 24, 2018. EXAM: CHEST - 2 VIEW COMPARISON:  Portable chest x-ray of June 24, 2018 FINDINGS: A small amount of pleural fluid has reaccumulated on the right. There is a trace of pleural fluid on the left as well. Multiple widespread pulmonary parenchymal masses of varying sizes are present. The heart and pulmonary vascularity are normal. The patient has undergone previous CABG. There is calcification in the wall of the aortic arch. The sternal wires are intact. A presumed cardiac loop recorder  is located over the left cardiac apex. IMPRESSION: Interval reaccumulation of a small amount of pleural fluid on the right. A smaller left pleural effusion is present but stable. Multiple widespread pulmonary metastatic nodules. Electronically Signed   By: David  Martinique M.D.   On: 06/28/2018 15:07   Ct Head Wo Contrast  Result Date: 07/08/2018 CLINICAL DATA:  72 year old female with seizure like activity and encephalopathy. History of lung cancer. EXAM: CT HEAD WITHOUT CONTRAST TECHNIQUE: Contiguous axial images were obtained from the base of the skull through the vertex without intravenous contrast. COMPARISON:  Head CT dated 01/19/2010 FINDINGS: Brain: There is a 12 x 13 mm high attenuating mass in the left frontal lobe with surrounding edema. An additional 12 x 12 mm high attenuating mass in the left posterior frontal convexity along the falx noted with mild surrounding edema. There is a large area of encephalomalacia in the right frontoparietal region. There is otherwise mild age-related atrophy and chronic microvascular ischemic changes. There is no acute intracranial hemorrhage. No midline shift. Vascular: No hyperdense vessel or unexpected calcification. Skull: Normal. Negative for fracture or focal lesion. Sinuses/Orbits: No acute finding. Other: None IMPRESSION: 1. No acute intracranial hemorrhage. 2. Left cerebral masses as described most consistent with metastatic disease. Further evaluation with MRI recommended. 3. Large area of encephalomalacia in the right frontoparietal region. Electronically Signed   By: Anner Crete M.D.   On: 07/08/2018 23:58   Ct Chest W Contrast  Result Date: 06/18/2018 CLINICAL DATA:  Changes consistent with pulmonary metastatic disease on recent chest x-ray EXAM: CT CHEST WITH CONTRAST TECHNIQUE: Multidetector CT imaging of the chest was performed during intravenous contrast administration. CONTRAST:  65m OMNIPAQUE IOHEXOL 300 MG/ML  SOLN COMPARISON:  Chest x-ray  from the previous day FINDINGS: Cardiovascular: Thoracic aorta demonstrates atherosclerotic calcifications without aneurysmal dilatation or dissection. Changes of coronary bypass grafting are noted. No cardiac enlargement is seen. Coronary calcifications are seen. The pulmonary artery demonstrates a normal branching pattern without central pulmonary emboli. Loop recorder is noted in the left chest wall anteriorly. Mediastinum/Nodes: Thoracic inlet is within normal limits. Scattered lymph nodes are noted throughout the mediastinum. The largest of these lies in the subcarinal region measuring 19 mm in short axis. Hilar adenopathy is noted bilaterally most prominent in the right infrahilar region measuring 18 mm in short axis. This likely represents a conglomeration of multiple smaller nodes. In the right suprahilar region and radiating into the upper lobe on the right there is a ovoid soft tissue mass identified which measures 4.6 by 2.6 cm in greatest dimension. Some narrowing of the right upper lobe bronchus is noted although it appears patent. The esophagus as visualized is within normal limits. Lungs/Pleura: A large right-sided pleural effusion is noted. Multiple, too numerous to count pulmonary nodules are identified consistent with metastatic disease. The largest of these lies  in the right upper lobe along the major fissure measuring 3.6 by 2.5 cm in greatest dimension. The large central right suprahilar mass lesion suggests a primary etiology with diffuse pulmonary metastatic disease. Bronchoscopic evaluation with biopsy is recommended. Upper Abdomen: Visualized upper abdomen demonstrates the adrenal glands to be within normal limits. Nonobstructing left renal stone is noted which measures 6 mm in greatest dimension. Renal vascular calcifications are noted as well. The remainder of the upper abdomen is unremarkable. Musculoskeletal: No acute bony abnormality is noted. Degenerative changes of the thoracic spine  are seen. 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 Right-sided pleural effusion is noted as well. Referral for multidisciplinary workup is recommended likely to include PET-CT and tissue sampling. Nonobstructing left renal stone. Aortic Atherosclerosis (ICD10-I70.0). Electronically Signed   By: Inez Catalina M.D.   On: 06/18/2018 09:58   Ct Angio Chest Pe W And/or Wo Contrast  Result Date: 07/02/2018 CLINICAL DATA:  Worsening shortness of breath since thoracentesis 1 week ago. EXAM: CT ANGIOGRAPHY CHEST WITH CONTRAST TECHNIQUE: Multidetector CT imaging of the chest was performed using the standard protocol during bolus administration of intravenous contrast. Multiplanar CT image reconstructions and MIPs were obtained to evaluate the vascular anatomy. CONTRAST:  70 mL ISOVUE-370 IOPAMIDOL (ISOVUE-370) INJECTION 76% COMPARISON:  06/18/2018 FINDINGS: Cardiovascular: Heart is normal size. Mild calcified plaque over the thoracic aorta. Median sternotomy wires are present. There is calcified plaque over the coronary arteries. Pulmonary arterial system is well opacified. No evidence of pulmonary emboli. Mediastinum/Nodes: 1.6 cm AP window lymph node and 1 cm prevascular lymph node without significant change. Subcarinal adenopathy without significant change. Mild right hilar adenopathy without significant change. Lungs/Pleura: Lungs are adequately inflated demonstrate no change in a small to moderate size right pleural effusion with new small to moderate size left pleural effusion with associated bibasilar atelectasis. No significant change in a 4.0 x 4.1 cm mass over the medial right upper lobe abutting the mediastinum. No change in numerous round bilateral pulmonary nodules compatible with metastatic disease. No significant change in 3.7 cm mass over the posterior right  middle lobe. Upper Abdomen: Previous cholecystectomy. Somewhat nodular contour to the liver without focal mass. Few small bilateral renal calcifications likely stones. No significant adenopathy or free fluid. Musculoskeletal: Degenerative change of the spine. Review of the MIP images confirms the above findings. IMPRESSION: Stable changes including a 4.0 x 4.1 cm medial right upper lobe lung mass abutting the mediastinum, numerous pulmonary nodules/masses without significant change compatible with metastatic disease, small to moderate size right pleural effusion with associated basilar atelectasis and mediastinal/hilar adenopathy. New small to moderate size left pleural effusion with basilar atelectasis. No evidence of pulmonary embolism. Aortic Atherosclerosis (ICD10-I70.0). Atherosclerotic coronary artery disease. Nephrolithiasis. Mild nodular contour of the liver which may be due to cirrhosis. Electronically Signed   By: Marin Olp M.D.   On: 07/02/2018 15:34   Nm Pet Image Initial (pi) Skull Base To Thigh  Result Date: 07/07/2018 CLINICAL DATA:  Initial treatment strategy for lung cancer. EXAM: NUCLEAR MEDICINE PET SKULL BASE TO THIGH TECHNIQUE: 10.1 mCi F-18 FDG was injected intravenously. Full-ring PET imaging was performed from the skull base to thigh after the radiotracer. CT data was obtained and used for attenuation correction and anatomic localization. Fasting blood glucose: 97 mg/dl COMPARISON:  07/02/2018 and 06/18/2018. FINDINGS: Mediastinal blood pool activity: SUV max 2.4 NECK: 9 mm nodule in the posterior left  parotid (CT image 22) has an SUV max of 5.8. Likely physiologic activity in the right vocal cords. No hypermetabolic lymph nodes. Incidental CT findings: Encephalomalacia in the right temporal lobe is partially imaged. CHEST: Hypermetabolic mediastinal lymph nodes are seen with an index low right paratracheal lymph node measuring 10 mm (CT image 48) with an SUV max of 7.7. Medial right  upper lobe mass is difficult to measure but has an SUV max of 9.0. There are multiple hypermetabolic nodules bilaterally, seen in a hematogenous distribution. Incidental CT findings: Atherosclerotic calcification of the aorta. No pericardial effusion. Moderate bilateral pleural effusions. ABDOMEN/PELVIS: No abnormal hypermetabolism in the liver, adrenal glands, spleen or pancreas. No hypermetabolic lymph nodes. There are complex cystic and solid masses in the pelvis bilaterally, which presumably arise from the ovaries, measuring 7.2 cm on the right and 10.4 cm in the midline/left ovary. There is associated mild hypermetabolism with an SUV max of 2.5 on the right and 3.4 within the larger lesion in the midline. Incidental CT findings: Liver and adrenal glands are unremarkable. Stones are seen in the kidneys. Spleen, pancreas, stomach and bowel are grossly unremarkable. SKELETON: No abnormal osseous hypermetabolism. Incidental CT findings: None. IMPRESSION: 1. Hypermetabolic right upper lobe mass with hypermetabolic mediastinal adenopathy and multiple hypermetabolic pulmonary nodules, findings most indicative of stage IV primary bronchogenic carcinoma. 2. Complex cystic and solid ovarian masses bilaterally with mild associated hypermetabolism. Findings are worrisome for malignancy. 3. Hypermetabolic left parotid nodule. Malignancy cannot be excluded. 4. Moderate bilateral pleural effusions. 5. Bilateral renal stones. 6.  Aortic atherosclerosis (ICD10-170.0). Electronically Signed   By: Lorin Picket M.D.   On: 07/07/2018 12:06   Dg Chest Port 1 View  Result Date: 07/09/2018 CLINICAL DATA:  Cough EXAM: PORTABLE CHEST 1 VIEW COMPARISON:  07/02/2018 FINDINGS: Large right central mass and numerous bilateral pulmonary metastases, stable since prior study. Prior CABG. Cardiomegaly. No visible significant effusions or acute bony abnormality. IMPRESSION: No significant change in the right lung mass and numerous  bilateral pulmonary metastases. Electronically Signed   By: Rolm Baptise M.D.   On: 07/09/2018 00:26   Dg Chest Port 1 View  Result Date: 06/17/2018 CLINICAL DATA:  Dyspnea today EXAM: PORTABLE CHEST 1 VIEW COMPARISON:  06/20/2010 FINDINGS: Multiple bilateral masslike opacities are seen scattered throughout the lungs consistent with metastasis. Heart size and mediastinal contours are stable with aortic atherosclerosis. Median sternotomy sutures are in place. Cardiac monitoring device projects over the left lateral hemithorax. No aggressive osseous lesions. Mild vascular congestion is noted. 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 Mild vascular congestion. Electronically Signed   By: Ashley Royalty M.D.   On: 06/17/2018 22:17   US Thoracentesis Asp Pleural Space W/img Guide  Result Date: 06/24/2018 INDICATION: Patient with CT findings highly suspicious of lung cancer, dyspnea, and right pleural effusion. Request is made for diagnostic and therapeutic right thoracentesis. EXAM: ULTRASOUND GUIDED DIAGNOSTIC AND THERAPEUTIC RIGHT THORACENTESIS MEDICATIONS: 10 mL 1% lidocaine COMPLICATIONS: None immediate. PROCEDURE: An ultrasound guided thoracentesis was thoroughly discussed with the patient and questions answered. The benefits, risks, alternatives and complications were also discussed. The patient understands and wishes to proceed with the procedure. Written consent was obtained. Ultrasound was performed to localize and mark an adequate pocket of fluid in the right chest. The area was then prepped and draped in the normal sterile fashion. 1% Lidocaine was used for local anesthesia. Under ultrasound guidance a 6 Fr Safe-T-Centesis catheter was introduced. Thoracentesis was performed. The catheter was removed and  a dressing applied. FINDINGS: A total of approximately 650 mL of hazy yellow fluid was removed. Samples were sent to the laboratory as requested by the clinical team. IMPRESSION:  Successful ultrasound guided right thoracentesis yielding 650 mL of pleural fluid. Read by: Earley Abide, PA-C Electronically Signed   By: Aletta Edouard M.D.   On: 06/24/2018 15:22

## 2018-07-11 ENCOUNTER — Encounter: Payer: Self-pay | Admitting: Radiation Oncology

## 2018-07-11 ENCOUNTER — Ambulatory Visit (HOSPITAL_COMMUNITY)
Admit: 2018-07-11 | Discharge: 2018-07-11 | Disposition: A | Discharge status: Home / Self Care | Payer: Medicare Other | Attending: Internal Medicine | Admitting: Internal Medicine

## 2018-07-11 ENCOUNTER — Inpatient Hospital Stay (HOSPITAL_COMMUNITY): Admit: 2018-07-11 | Payer: Medicare Other

## 2018-07-11 DIAGNOSIS — C3490 Malignant neoplasm of unspecified part of unspecified bronchus or lung: Secondary | ICD-10-CM

## 2018-07-11 DIAGNOSIS — C7931 Secondary malignant neoplasm of brain: Secondary | ICD-10-CM | POA: Diagnosis not present

## 2018-07-11 DIAGNOSIS — Z85118 Personal history of other malignant neoplasm of bronchus and lung: Secondary | ICD-10-CM | POA: Diagnosis not present

## 2018-07-11 LAB — COMPREHENSIVE METABOLIC PANEL
ALT: 13 U/L (ref 0–44)
AST: 17 U/L (ref 15–41)
Albumin: 2.9 g/dL — ABNORMAL LOW (ref 3.5–5.0)
Alkaline Phosphatase: 46 U/L (ref 38–126)
Anion gap: 10 (ref 5–15)
BUN: 36 mg/dL — ABNORMAL HIGH (ref 8–23)
CO2: 34 mmol/L — ABNORMAL HIGH (ref 22–32)
Calcium: 8.1 mg/dL — ABNORMAL LOW (ref 8.9–10.3)
Chloride: 99 mmol/L (ref 98–111)
Creatinine, Ser: 0.9 mg/dL (ref 0.44–1.00)
GFR calc Af Amer: >60 mL/min (ref >60)
Glucose, Bld: 134 mg/dL — ABNORMAL HIGH (ref 70–99)
Potassium: 5.3 mmol/L — ABNORMAL HIGH (ref 3.5–5.1)
Sodium: 143 mmol/L (ref 135–145)
Total Bilirubin: 0.4 mg/dL (ref 0.3–1.2)
Total Protein: 6.2 g/dL — ABNORMAL LOW (ref 6.5–8.1)

## 2018-07-11 LAB — GLUCOSE, CAPILLARY
Glucose-Capillary: 114 mg/dL — ABNORMAL HIGH (ref 70–99)
Glucose-Capillary: 173 mg/dL — ABNORMAL HIGH (ref 70–99)
Glucose-Capillary: 92 mg/dL (ref 70–99)
Glucose-Capillary: 124 mg/dL — ABNORMAL HIGH (ref 70–99)

## 2018-07-11 LAB — LACTATE DEHYDROGENASE: LDH: 232 U/L — ABNORMAL HIGH (ref 98–192)

## 2018-07-11 LAB — URIC ACID: Uric Acid, Serum: 6.4 mg/dL (ref 2.5–7.1)

## 2018-07-11 MED ORDER — SODIUM POLYSTYRENE SULFONATE 15 GM/60ML PO SUSP
30.0000 g | Freq: Once | ORAL | Status: DC
  Filled 2018-07-11: qty 120

## 2018-07-11 MED ORDER — GADOBUTROL 1 MMOL/ML IV SOLN
9.0000 mL | Freq: Once | INTRAVENOUS | Status: AC | PRN
  Administered 2018-07-11: 9 mL via INTRAVENOUS

## 2018-07-11 MED ORDER — SODIUM POLYSTYRENE SULFONATE 15 GM/60ML PO SUSP
15.0000 g | Freq: Once | ORAL | Status: AC
  Administered 2018-07-11: 15 g via ORAL
  Filled 2018-07-11: qty 60

## 2018-07-11 MED ORDER — SODIUM CHLORIDE 0.9 % IV BOLUS
1000.0000 mL | Freq: Once | INTRAVENOUS | Status: AC
  Administered 2018-07-11: 1000 mL via INTRAVENOUS

## 2018-07-11 MED ORDER — IPRATROPIUM-ALBUTEROL 0.5-2.5 (3) MG/3ML IN SOLN
3.0000 mL | Freq: Three times a day (TID) | RESPIRATORY_TRACT | Status: DC
  Administered 2018-07-12 – 2018-07-16 (×15): 3 mL via RESPIRATORY_TRACT
  Filled 2018-07-11 (×15): qty 3

## 2018-07-11 NOTE — Progress Notes (Signed)
Radiation Oncology         [phone] ________________________________  Initial Inpatient Consultation  Name: [name] MRN: [id number]  Date: 07/11/2018  DOB: [birth year]  [id number], Cammie Mcgee, MD  No ref. 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 female who is presented recently with chest tightness and shortness of breath. She ultimately was found to have stage IV small cell lung cancer. This weekend the patient presented with seizures at home and was brought into the emergency room and subsequently admitted. She was treated with Decadron and Keppra and has been seizure-free and stable since admission.  scan of the head was significant for 2 lesions within the brain consistent with brain metastasis. Yesterday evening the patient was taken to [facility] for an MRI the Brain. Per Reports from Radiology This Could Not Be Completed Due To the Patient's Breathing Difficulties and Movement. Radiation oncology is been consulted for consideration for palliative treatments to the brain  PREVIOUS RADIATION THERAPY: No  PAST MEDICAL HISTORY:  has a past medical history of Atrial fibrillation ([place]), CAD (coronary artery disease), Cancer ([place]), Chronic kidney disease, Diabetes mellitus, Gastritis, GI bleeding, Hiatal hernia, Hyperglycemia, Hyperlipidemia, Hypertension, Mediastinal adenopathy, Obesity, Obstructive sleep apnea, Osteopenia, Oxygen dependent, Peripheral artery disease ([place]), Pneumonia, Stroke ([place]), Systolic murmur, Ulcer, Vitamin D deficiency, and Wears dentures.    PAST SURGICAL HISTORY: Past Surgical History:  Procedure Laterality Date  . ANGIOPLASTY / STENTING FEMORAL Right    leg  . BREAST SURGERY     biopsy  . Carotid doppler  10/13/2011   right ICA-49% diameter reduction and left ICA with 50%-69% diameter reduction  . CATARACT EXTRACTION W/  INTRAOCULAR LENS  IMPLANT, BILATERAL    . CHOLECYSTECTOMY    . COLONOSCOPY W/ BIOPSIES AND POLYPECTOMY    . CORONARY ARTERY BYPASS GRAFT  05/22/2010  . Heart bypass    . Myoview Stress  08/13/2010   inferolateral scar without ischemia  . VIDEO BRONCHOSCOPY WITH ENDOBRONCHIAL ULTRASOUND N/A 07/07/2018   Procedure: VIDEO BRONCHOSCOPY WITH ENDOBRONCHIAL ULTRASOUND;  Surgeon: Garner Nash, DO;  Location: [facility] OR;  Service: Thoracic;  Laterality: N/A;    FAMILY HISTORY: family history includes COPD in her brother; Cancer in her mother; Diabetes in her sister; Heart disease in her brother.  SOCIAL HISTORY:  reports that she quit smoking about 8 years ago. She started smoking about 60 years ago. She has a 52.00 [name]-year smoking history. She has never used smokeless tobacco. She reports that she does not drink alcohol or use drugs.  ALLERGIES: Neosporin [neomycin-polymyxin-gramicidin]; Latex; and Sulfa antibiotics  MEDICATIONS:  No current facility-administered medications for this visit.    No current outpatient medications on file.   Facility-Administered Medications Ordered in Other Visits  Medication Dose Route Frequency Provider Last Rate Last Dose  . acetaminophen (TYLENOL) tablet 650 mg  650 mg Oral Q6H PRN Hosie Poisson, MD       Or  . acetaminophen (TYLENOL) suppository 650 mg  650 mg Rectal Q6H PRN Hosie Poisson, MD      . albuterol (PROVENTIL) (2.5 MG/3ML) 0.083% nebulizer solution 3 mL  3 mL Inhalation Q6H PRN [name], MD      . aspirin EC tablet 81 mg  81 mg Oral Daily [name], MD   81 mg at 07/11/18 0855  . clopidogrel (PLAVIX) tablet 75 mg  75 mg Oral Daily [name], MD   75 mg at 07/11/18 0855  . dexamethasone (DECADRON) injection 4 mg  4 mg Intravenous Q6H Green, Terri L, RPH   4 mg at 07/11/18 0558  . hydrALAZINE (APRESOLINE) tablet 25 mg  25 mg Oral Daily PRN [name], MD   25 mg at 07/10/18 2224  .  HYDROcodone-acetaminophen (NORCO/VICODIN) 5-325 MG per tablet 1 tablet  1 tablet Oral Q6H PRN [name], MD   1 tablet at 07/11/18 0332  . insulin aspart (novoLOG) injection 0-15 Units  0-15 Units Subcutaneous TID WC Hosie Poisson, MD   2 Units at 07/10/18 1756  . insulin aspart (novoLOG) injection 0-5 Units  0-5 Units Subcutaneous QHS Hosie Poisson, MD      . insulin detemir (LEVEMIR) injection 50 Units  50 Units Subcutaneous BID [name], MD   50 Units at 07/11/18 0855  . ipratropium-albuterol (DUONEB) 0.5-2.5 (3) MG/3ML nebulizer solution 3 mL  3 mL Nebulization Q6H Hosie Poisson, MD   3 mL at 07/11/18 0821  . levETIRAcetam (KEPPRA) tablet 500 mg  500 mg Oral BID Heath Lark, MD   500 mg at 07/11/18 0856  . LORazepam (ATIVAN) injection 1-2 mg  1-2 mg Intravenous Q2H PRN [name], MD   2 mg at 07/10/18 1219  . metoCLOPramide (REGLAN) tablet 5 mg  5 mg Oral BID [name], MD   5 mg at 07/11/18 0806  . morphine 4 MG/ML injection 3 mg  3 mg Intravenous Q4H PRN [name], MD   3 mg at 07/10/18 1127  . ondansetron (ZOFRAN) tablet 4 mg  4 mg Oral Q6H PRN Hosie Poisson, MD       Or  . ondansetron (ZOFRAN) injection 4 mg  4 mg Intravenous Q6H PRN Hosie Poisson, MD   4 mg at 07/11/18 0806  . pantoprazole (PROTONIX) EC tablet 40 mg  40 mg Oral Daily Hosie Poisson, MD   40 mg at 07/11/18 0856  . polyethylene glycol (MIRALAX / GLYCOLAX) packet 17 g  17 g Oral Daily Hosie Poisson, MD      . rosuvastatin (CRESTOR) tablet 10 mg  10 mg Oral Daily [name], MD   10 mg at 07/11/18 0856  . sodium chloride 0.9 % bolus 1,000 mL  1,000 mL Intravenous Once Aileen Fass, Tammi Klippel, MD 983.6 mL/hr at 07/11/18 1029 1,000 mL at 07/11/18 1029  . sodium chloride flush (NS) 0.9 % injection 3 mL  3 mL Intravenous Q12H Hosie Poisson, MD   3 mL at 07/11/18 0856    REVIEW OF SYSTEMS: REVIEW OF SYSTEMS: A 10+ POINT REVIEW OF SYSTEMS WAS OBTAINED including  neurology, dermatology, psychiatry, cardiac, respiratory, lymph, extremities, GI, GU, musculoskeletal, constitutional, reproductive, HEENT. All pertinent positives are noted in the HPI. All others are negative. As above patient presented with chest tightness and shortness of breath. She denies any significant hemoptysis prior to diagnosis. Patient has history of problems with peripheral vision related to prior CVA. She is unable to drive light of her visual difficulties. Patient reports ambulating around the house without assistance prior to admission.  PHYSICAL EXAM:  Vitals - 1 value per visit 57/03/4695  SYSTOLIC 295  DIASTOLIC 71  Pulse 80  Temperature 97.6  Respirations 20  Weight (lb)   Height   BMI   VISIT REPORT     This is a very pleasant 72 year old female lying in her hospital bed. She is alert  and oriented. She gives a very detailed history of her prior medical history. She understands her diagnosis well with need for chemotherapy and brain radiation treatments. The extraocular eye movements are intact. The tongue is midline. No secondary infection noted in the oral cavity or posterior pharynx. No palpable adenopathy in the neck clavicular or axillary areas. Lungs are clear to auscultation. The heart has a regular rhythm and rate. The abdomen is soft and nontender with normal bowel sounds. Motor strength is 5 out of 5 in the proximal and distal muscle groups in the upper lower extremities.     ECOG = 2  0 - Asymptomatic (Fully active, able to carry on all predisease activities without restriction)  1 - Symptomatic but completely ambulatory (Restricted in physically strenuous activity but ambulatory and able to carry out work of a light or sedentary nature. For example, light housework, office work)  2 - Symptomatic, <50% in bed during the day (Ambulatory and capable of all self care but unable to carry out any work activities. Up and about more than 50% of waking hours)  3 -  Symptomatic, >50% in bed, but not bedbound (Capable of only limited self-care, confined to bed or chair 50% or more of waking hours)  4 - Bedbound (Completely disabled. Cannot carry on any self-care. Totally confined to bed or chair)  5 - Death   Eustace Pen MM, Creech RH, Tormey DC, et al. [phone]). "Toxicity and response criteria of the [facility]". [place] Oncol. 5 (6): 649-55  LABORATORY DATA:  Lab Results  Component Value Date   WBC 11.8 (H) 07/09/2018   HGB 8.9 (L) 07/09/2018   HCT 30.8 (L) 07/09/2018   MCV 99.7 07/09/2018   PLT 140 (L) 07/09/2018   NEUTROABS 14.8 (H) 07/08/2018   Lab Results  Component Value Date   NA 143 07/11/2018   K 5.3 (H) 07/11/2018   CL 99 07/11/2018   CO2 34 (H) 07/11/2018   GLUCOSE 134 (H) 07/11/2018   CREATININE 0.90 07/11/2018   CALCIUM 8.1 (L) 07/11/2018      RADIOGRAPHY: Dg Chest 1 View  Result Date: 06/24/2018 CLINICAL DATA:  Status post right thoracentesis. EXAM: CHEST  1 VIEW COMPARISON:  Chest x-ray dated June 17, 2018. FINDINGS: Stable cardiomediastinal silhouette status post CABG. Innumerable bilateral pulmonary nodules are unchanged. No significant residual right pleural effusion. Unchanged trace left pleural effusion. No pneumothorax. No acute osseous abnormality. IMPRESSION: 1. No residual right pleural effusion status post thoracentesis. No pneumothorax. 2. Unchanged diffuse pulmonary metastatic disease. Electronically Signed   By: Titus Dubin M.D.   On: 06/24/2018 15:16   Dg Chest 2 View  Result Date: 07/02/2018 CLINICAL DATA:  Shortness of breath. EXAM: CHEST - 2 VIEW COMPARISON:  06/28/2018. FINDINGS: Monitoring device noted over the chest. Prior CABG. Heart size stable. Multiple bilateral pulmonary mass lesions consistent metastatic disease again noted. Stable small bilateral pleural effusions. No pneumothorax. IMPRESSION: Multiple bilateral pulmonary mass lesions consistent metastatic disease again  noted. Stable small bilateral pleural effusions. Electronically Signed   By: Marcello Moores  Register   On: 07/02/2018 12:11   Dg Chest 2 View  Result Date: 06/28/2018 CLINICAL DATA:  Status post right-sided thoracentesis on June 24, 2018. EXAM: CHEST - 2 VIEW COMPARISON:  Portable chest x-ray of June 24, 2018 FINDINGS: A small amount of pleural fluid has reaccumulated on the right. There is a trace of pleural fluid on the left as well. Multiple widespread pulmonary parenchymal masses of varying  sizes are present. The heart and pulmonary vascularity are normal. The patient has undergone previous CABG. There is calcification in the wall of the aortic arch. The sternal wires are intact. A presumed cardiac loop recorder is located over the left cardiac apex. IMPRESSION: Interval reaccumulation of a small amount of pleural fluid on the right. A smaller left pleural effusion is present but stable. Multiple widespread pulmonary metastatic nodules. Electronically Signed   By: David  Martinique M.D.   On: 06/28/2018 15:07   Ct Head Wo Contrast  Result Date: 07/08/2018 CLINICAL DATA:  72 year old female with seizure like activity and encephalopathy. History of lung cancer. EXAM: CT HEAD WITHOUT CONTRAST TECHNIQUE: Contiguous axial images were obtained from the base of the skull through the vertex without intravenous contrast. COMPARISON:  Head CT dated 01/19/2010 FINDINGS: Brain: There is a 12 x 13 mm high attenuating mass in the left frontal lobe with surrounding edema. An additional 12 x 12 mm high attenuating mass in the left posterior frontal convexity along the falx noted with mild surrounding edema. There is a large area of encephalomalacia in the right frontoparietal region. There is otherwise mild age-related atrophy and chronic microvascular ischemic changes. There is no acute intracranial hemorrhage. No midline shift. Vascular: No hyperdense vessel or unexpected calcification. Skull: Normal. Negative for  fracture or focal lesion. Sinuses/Orbits: No acute finding. Other: None IMPRESSION: 1. No acute intracranial hemorrhage. 2. Left cerebral masses as described most consistent with metastatic disease. Further evaluation with MRI recommended. 3. Large area of encephalomalacia in the right frontoparietal region. Electronically Signed   By: Anner Crete M.D.   On: 07/08/2018 23:58   Ct Chest W Contrast  Result Date: 06/18/2018 CLINICAL DATA:  Changes consistent with pulmonary metastatic disease on recent chest x-ray EXAM: CT CHEST WITH CONTRAST TECHNIQUE: Multidetector CT imaging of the chest was performed during intravenous contrast administration. CONTRAST:  80mL OMNIPAQUE IOHEXOL 300 MG/ML  SOLN COMPARISON:  Chest x-ray from the previous day FINDINGS: Cardiovascular: Thoracic aorta demonstrates atherosclerotic calcifications without aneurysmal dilatation or dissection. Changes of coronary bypass grafting are noted. No cardiac enlargement is seen. Coronary calcifications are seen. The pulmonary artery demonstrates a normal branching pattern without central pulmonary emboli. Loop recorder is noted in the left chest wall anteriorly. Mediastinum/Nodes: Thoracic inlet is within normal limits. Scattered lymph nodes are noted throughout the mediastinum. The largest of these lies in the subcarinal region measuring 19 mm in short axis. Hilar adenopathy is noted bilaterally most prominent in the right infrahilar region measuring 18 mm in short axis. This likely represents a conglomeration of multiple smaller nodes. In the right suprahilar region and radiating into the upper lobe on the right there is a ovoid soft tissue mass identified which measures 4.6 by 2.6 cm in greatest dimension. Some narrowing of the right upper lobe bronchus is noted although it appears patent. The esophagus as visualized is within normal limits. Lungs/Pleura: A large right-sided pleural effusion is noted. Multiple, too numerous to count  pulmonary nodules are identified consistent with metastatic disease. The largest of these lies in the right upper lobe along the major fissure measuring 3.6 by 2.5 cm in greatest dimension. The large central right suprahilar mass lesion suggests a primary etiology with diffuse pulmonary metastatic disease. Bronchoscopic evaluation with biopsy is recommended. Upper Abdomen: Visualized upper abdomen demonstrates the adrenal glands to be within normal limits. Nonobstructing left renal stone is noted which measures 6 mm in greatest dimension. Renal vascular calcifications are noted as well. The  remainder of the upper abdomen is unremarkable. Musculoskeletal: No acute bony abnormality is noted. Degenerative changes of the thoracic spine are seen. 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 Right-sided pleural effusion is noted as well. Referral for multidisciplinary workup is recommended likely to include PET-CT and tissue sampling. Nonobstructing left renal stone. Aortic Atherosclerosis (ICD10-I70.0). Electronically Signed   By: Inez Catalina M.D.   On: 06/18/2018 09:58   Ct Angio Chest Pe W And/or Wo Contrast  Result Date: 07/02/2018 CLINICAL DATA:  Worsening shortness of breath since thoracentesis 1 week ago. EXAM: CT ANGIOGRAPHY CHEST WITH CONTRAST TECHNIQUE: Multidetector CT imaging of the chest was performed using the standard protocol during bolus administration of intravenous contrast. Multiplanar CT image reconstructions and MIPs were obtained to evaluate the vascular anatomy. CONTRAST:  70 mL ISOVUE-370 IOPAMIDOL (ISOVUE-370) INJECTION 76% COMPARISON:  06/18/2018 FINDINGS: Cardiovascular: Heart is normal size. Mild calcified plaque over the thoracic aorta. Median sternotomy wires are present. There is calcified plaque over the coronary arteries. Pulmonary arterial  system is well opacified. No evidence of pulmonary emboli. Mediastinum/Nodes: 1.6 cm AP window lymph node and 1 cm prevascular lymph node without significant change. Subcarinal adenopathy without significant change. Mild right hilar adenopathy without significant change. Lungs/Pleura: Lungs are adequately inflated demonstrate no change in a small to moderate size right pleural effusion with new small to moderate size left pleural effusion with associated bibasilar atelectasis. No significant change in a 4.0 x 4.1 cm mass over the medial right upper lobe abutting the mediastinum. No change in numerous round bilateral pulmonary nodules compatible with metastatic disease. No significant change in 3.7 cm mass over the posterior right middle lobe. Upper Abdomen: Previous cholecystectomy. Somewhat nodular contour to the liver without focal mass. Few small bilateral renal calcifications likely stones. No significant adenopathy or free fluid. Musculoskeletal: Degenerative change of the spine. Review of the MIP images confirms the above findings. IMPRESSION: Stable changes including a 4.0 x 4.1 cm medial right upper lobe lung mass abutting the mediastinum, numerous pulmonary nodules/masses without significant change compatible with metastatic disease, small to moderate size right pleural effusion with associated basilar atelectasis and mediastinal/hilar adenopathy. New small to moderate size left pleural effusion with basilar atelectasis. No evidence of pulmonary embolism. Aortic Atherosclerosis (ICD10-I70.0). Atherosclerotic coronary artery disease. Nephrolithiasis. Mild nodular contour of the liver which may be due to cirrhosis. Electronically Signed   By: Marin Olp M.D.   On: 07/02/2018 15:34   Nm Pet Image Initial (pi) Skull Base To Thigh  Result Date: 07/07/2018 CLINICAL DATA:  Initial treatment strategy for lung cancer. EXAM: NUCLEAR MEDICINE PET SKULL BASE TO THIGH TECHNIQUE: 10.1 mCi F-18 FDG was injected  intravenously. Full-ring PET imaging was performed from the skull base to thigh after the radiotracer. CT data was obtained and used for attenuation correction and anatomic localization. Fasting blood glucose: 97 mg/dl COMPARISON:  07/02/2018 and 06/18/2018. FINDINGS: Mediastinal blood pool activity: SUV max 2.4 NECK: 9 mm nodule in the posterior left parotid (CT image 22) has an SUV max of 5.8. Likely physiologic activity in the right vocal cords. No hypermetabolic lymph nodes. Incidental CT findings: Encephalomalacia in the right temporal lobe is partially imaged. CHEST: Hypermetabolic mediastinal lymph nodes are seen with an index low right paratracheal lymph node measuring 10 mm (CT image 48) with an SUV max of 7.7. Medial right upper lobe mass is difficult to measure but has an SUV  max of 9.0. There are multiple hypermetabolic nodules bilaterally, seen in a hematogenous distribution. Incidental CT findings: Atherosclerotic calcification of the aorta. No pericardial effusion. Moderate bilateral pleural effusions. ABDOMEN/PELVIS: No abnormal hypermetabolism in the liver, adrenal glands, spleen or pancreas. No hypermetabolic lymph nodes. There are complex cystic and solid masses in the pelvis bilaterally, which presumably arise from the ovaries, measuring 7.2 cm on the right and 10.4 cm in the midline/left ovary. There is associated mild hypermetabolism with an SUV max of 2.5 on the right and 3.4 within the larger lesion in the midline. Incidental CT findings: Liver and adrenal glands are unremarkable. Stones are seen in the kidneys. Spleen, pancreas, stomach and bowel are grossly unremarkable. SKELETON: No abnormal osseous hypermetabolism. Incidental CT findings: None. IMPRESSION: 1. Hypermetabolic right upper lobe mass with hypermetabolic mediastinal adenopathy and multiple hypermetabolic pulmonary nodules, findings most indicative of stage IV primary bronchogenic carcinoma. 2. Complex cystic and solid ovarian  masses bilaterally with mild associated hypermetabolism. Findings are worrisome for malignancy. 3. Hypermetabolic left parotid nodule. Malignancy cannot be excluded. 4. Moderate bilateral pleural effusions. 5. Bilateral renal stones. 6.  Aortic atherosclerosis (ICD10-170.0). Electronically Signed   By: Lorin Picket M.D.   On: 07/07/2018 12:06   Dg Chest Port 1 View  Result Date: 07/09/2018 CLINICAL DATA:  Cough EXAM: PORTABLE CHEST 1 VIEW COMPARISON:  07/02/2018 FINDINGS: Large right central mass and numerous bilateral pulmonary metastases, stable since prior study. Prior CABG. Cardiomegaly. No visible significant effusions or acute bony abnormality. IMPRESSION: No significant change in the right lung mass and numerous bilateral pulmonary metastases. Electronically Signed   By: Rolm Baptise M.D.   On: 07/09/2018 00:26   Dg Chest Port 1 View  Result Date: 06/17/2018 CLINICAL DATA:  Dyspnea today EXAM: PORTABLE CHEST 1 VIEW COMPARISON:  06/20/2010 FINDINGS: Multiple bilateral masslike opacities are seen scattered throughout the lungs consistent with metastasis. Heart size and mediastinal contours are stable with aortic atherosclerosis. Median sternotomy sutures are in place. Cardiac monitoring device projects over the left lateral hemithorax. No aggressive osseous lesions. Mild vascular congestion is noted. 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 Mild vascular congestion. Electronically Signed   By: Ashley Royalty M.D.   On: 06/17/2018 22:17   US Thoracentesis Asp Pleural Space W/img Guide  Result Date: 06/24/2018 INDICATION: Patient with CT findings highly suspicious of lung cancer, dyspnea, and right pleural effusion. Request is made for diagnostic and therapeutic right thoracentesis. EXAM: ULTRASOUND GUIDED DIAGNOSTIC AND THERAPEUTIC RIGHT THORACENTESIS MEDICATIONS: 10 mL 1% lidocaine COMPLICATIONS: None immediate. PROCEDURE: An ultrasound guided thoracentesis was thoroughly  discussed with the patient and questions answered. The benefits, risks, alternatives and complications were also discussed. The patient understands and wishes to proceed with the procedure. Written consent was obtained. Ultrasound was performed to localize and mark an adequate pocket of fluid in the right chest. The area was then prepped and draped in the normal sterile fashion. 1% Lidocaine was used for local anesthesia. Under ultrasound guidance a 6 Fr Safe-T-Centesis catheter was introduced. Thoracentesis was performed. The catheter was removed and a dressing applied. FINDINGS: A total of approximately 650 mL of hazy yellow fluid was removed. Samples were sent to the laboratory as requested by the clinical team. IMPRESSION: Successful ultrasound guided right thoracentesis yielding 650 mL of pleural fluid. Read by: Earley Abide, PA-C Electronically Signed   By: Aletta Edouard M.D.   On: 06/24/2018 15:22      IMPRESSION: stage IV small cell carcinoma of lung. I'm unsure  whether the patient was able to complete her MRI the brain last night. Regardless of this issue I would recommend radiation treatments to the whole brain with her diagnosis of small cell lung cancer. I discussed the course of treatment side effects and potential toxicities with radiation therapy in this situation with the patient. She appears to understand wishes to proceed with treatment  PLAN:The patient will be brought down to radiation oncology tomorrow for planning and her first treatment. I would recommend 10 treatments to the whole brain.     ------------------------------------------------  Blair Promise, PhD, MD

## 2018-07-11 NOTE — Progress Notes (Signed)
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 MRI is pending Continue IV dexamethasone and IV Keppra for seizures prevention I have consulted radiation oncologist to plan palliative radiation therapy to the brain  Chronic pain Recommend IV pain medicine to keep her comfortable  Chronic cough Likely related to her cancer. Continue oxygen therapy Could be also due to COPD exacerbation  Mild pancytopenia Could be due to hydration/hemodilution Monitor closely  Elevated potassium with stable CKD Could be due to spontaneous tumor lysis Uric acid and LDH level are ok  Moderate protein calorie malnutrition Could be due to untreated cancer Monitor closely  Discharge planning Not ready for discharge for the next few days until she can get her treatment planned Dr. Julien Nordmann will resume care next week  Heath Lark, MD 07/11/2018  9:10 AM   Subjective:  She felt better today.  No recurrence of seizures.  Her pain is well controlled. Denies worsening cough or shortness of breath She had MRI done yesterday at [facility].  Imaging studies and results are not available  Objective:  Vitals:   07/11/18 0514 07/11/18 0822  BP: (!) 187/71   Pulse: 80   Resp: 20   Temp: 97.6 F (36.4 C)   SpO2: 92% 91%     Intake/Output Summary (Last 24 hours) at 07/11/2018 0910 Last data filed at 07/11/2018 0700 Gross per 24 hour  Intake 1072.81 ml  Output 1450 ml  Net -377.19 ml    GENERAL:alert, no distress and comfortable Musculoskeletal:no cyanosis of digits and no clubbing  NEURO: alert & oriented x 3 with fluent speech, no focal motor/sensory deficits   Labs:  Lab Results  Component Value Date   WBC 11.8 (H) 07/09/2018   HGB 8.9 (L) 07/09/2018   HCT 30.8 (L) 07/09/2018   MCV 99.7 07/09/2018    PLT 140 (L) 07/09/2018   NEUTROABS 14.8 (H) 07/08/2018    Lab Results  Component Value Date   NA 143 07/11/2018   K 5.3 (H) 07/11/2018   CL 99 07/11/2018   CO2 34 (H) 07/11/2018    Studies:  No results found.

## 2018-07-11 NOTE — Progress Notes (Addendum)
[facility] PROGRESS NOTE    Progress Note  [name]  KYH:[id number] DOB: 1946/01/02 DOA: 07/08/2018 PCP: Susy Frizzle, MD     Brief Narrative:   [name] is an 72 y.o. female female with past medical history of hypertension paroxysmal atrial, with a history of recently diagnosed small cell lung cancer fibrillation COPD on oxygen diabetes mellitus type 2 chronic kidney disease who presents after witnessed seizure  Assessment/Plan:   Poorly differentiated lung cancer in adult/ Brain metastases (HCC)/Seizure [facility]): Decrease Decadron to IV 4 mg every 6 hours, MRI of the brain is pending. Continue narcotics for pain control, oncology has been consulted they recommended an MRI which results are pending. Continue Decadron and Keppra.  Hyperkalemia: Did not receive her Kayexalate dose yesterday we will try to get it in today. Potassium continues to be high, continue to hold lisinopril. Continue IV fluids.  History of atrial fibrillation: Rate controlled continue current medication.  leuCytosis: Leukocytosis likely due to malignancy plus or minus steroids, she has remained febrile.  Normocytic anemia: Close to baseline.  Acute kidney injury on chronic kidney disease stage III: Likely prerenal azotemia resolved with IV fluid hydration her creatinine is less than 1 today.  Which is her baseline.  Obesity: Estimated body mass index is 41.7 kg/m as calculated from the following:   Height as of this encounter: 4\' 10"  (1.473 m).   Weight as of this encounter: 90.5 kg.  DVT prophylaxis: lovenox Family Communication:husband Disposition Plan/Barrier to D/C: once radiation started Code Status:     Code Status Orders  (From admission, onward)         Start     Ordered   07/09/18 0253  Full code  Continuous     07/09/18 0258        Code Status History    Date Active Date Inactive Code Status Order ID Comments User Context   06/18/2018 0318 06/19/2018  1835 Full Code [id number]  Shela Leff, MD Inpatient        IV Access:    Peripheral IV   Procedures and diagnostic studies:   No results found.   Medical Consultants:    None.  Anti-Infectives:   None  Subjective:    [name] no seizures overnight, no bowel movements either.  In a good mood and pleasant today. Objective:    Vitals:   07/10/18 2221 07/11/18 0209 07/11/18 0514 07/11/18 0822  BP: (!) 180/69  (!) 187/71   Pulse:   80   Resp:   20   Temp:   97.6 F (36.4 C)   TempSrc:   Oral   SpO2:  95% 92% 91%  Weight:      Height:        Intake/Output Summary (Last 24 hours) at 07/11/2018 0942 Last data filed at 07/11/2018 0700 Gross per 24 hour  Intake 1072.81 ml  Output 1450 ml  Net -377.19 ml   Filed Weights   07/08/18 2306 07/10/18 0054  Weight: 91.6 kg 90.5 kg    Exam: General exam: In no acute distress.  In a very good mood today Respiratory system: Good air movement and clear to auscultation Cardiovascular system: Rate and rhythm with a positive S1 and S2. Gastrointestinal system: Bowel sounds soft nontender nondistended Central nervous system: Alert and oriented x3 no focal deficits moving all 4 extremities without any difficulties. Extremities: No pedal edema. Skin: No rashes, lesions or ulcers Psychiatry: Judgment and insight appear normal.  Data Reviewed:    Labs: Basic Metabolic Panel: Recent Labs  Lab 07/08/18 2325 07/08/18 2331 07/09/18 0536 07/09/18 1855 07/11/18 0505  NA 144 140 144  --  143  K 4.9 4.7 5.3* 5.5* 5.3*  CL 94* 93* 97*  --  99  CO2 39*  --  39*  --  34*  GLUCOSE 98 96 99  --  134*  BUN 36* 37* 34*  --  36*  CREATININE 1.28* 1.20* 1.17*  --  0.90  CALCIUM 8.8*  --  8.3*  --  8.1*   GFR Estimated Creatinine Clearance: 53.3 mL/min (by C-G formula based on SCr of 0.9 mg/dL). Liver Function Tests: Recent Labs  Lab 07/08/18 2325 07/09/18 0536 07/11/18 0505  AST 19 19 17   ALT 14 15 13     ALKPHOS 53 49 46  BILITOT 0.4 0.3 0.4  PROT 6.4* 5.9* 6.2*  ALBUMIN 2.7* 2.5* 2.9*   No results for input(s): LIPASE, AMYLASE in the last 168 hours. No results for input(s): AMMONIA in the last 168 hours. Coagulation profile Recent Labs  Lab 07/07/18 0935 07/08/18 2325  INR 1.02 0.95    CBC: Recent Labs  Lab 07/08/18 2325 07/08/18 2331 07/09/18 0420  WBC 17.0*  --  11.8*  NEUTROABS 14.8*  --   --   HGB 11.4* 12.6 8.9*  HCT 39.1 37.0 30.8*  MCV 98.0  --  99.7  PLT 173  --  140*   Cardiac Enzymes: No results for input(s): CKTOTAL, CKMB, CKMBINDEX, TROPONINI in the last 168 hours. BNP (last 3 results) No results for input(s): PROBNP in the last 8760 hours. CBG: Recent Labs  Lab 07/10/18 0716 07/10/18 1117 07/10/18 1659 07/10/18 2055 07/11/18 0733  GLUCAP 172* 253* 132* 121* 114*   D-Dimer: No results for input(s): DDIMER in the last 72 hours. Hgb A1c: No results for input(s): HGBA1C in the last 72 hours. Lipid Profile: No results for input(s): CHOL, HDL, LDLCALC, TRIG, CHOLHDL, LDLDIRECT in the last 72 hours. Thyroid function studies: No results for input(s): TSH, T4TOTAL, T3FREE, THYROIDAB in the last 72 hours.  Invalid input(s): FREET3 Anemia work up: No results for input(s): VITAMINB12, FOLATE, FERRITIN, TIBC, IRON, RETICCTPCT in the last 72 hours. Sepsis Labs: Recent Labs  Lab 07/08/18 2325 07/09/18 0420  WBC 17.0* 11.8*   Microbiology No results found for this or any previous visit (from the past 240 hour(s)).   Medications:   . aspirin EC  81 mg Oral Daily  . clopidogrel  75 mg Oral Daily  . dexamethasone  4 mg Intravenous Q6H  . insulin aspart  0-15 Units Subcutaneous TID WC  . insulin aspart  0-5 Units Subcutaneous QHS  . insulin detemir  50 Units Subcutaneous BID  . ipratropium-albuterol  3 mL Nebulization Q6H  . levETIRAcetam  500 mg Oral BID  . metoCLOPramide  5 mg Oral BID  . pantoprazole  40 mg Oral Daily  . polyethylene glycol   17 g Oral Daily  . rosuvastatin  10 mg Oral Daily  . sodium chloride flush  3 mL Intravenous Q12H  . sodium polystyrene  15 g Oral Once  . sodium polystyrene  30 g Oral Once   Continuous Infusions:   LOS: 2 days   [name]  [facility]   *Please refer to [email or web address], password [email or web address] to get updated schedule on who will round on this patient, as hospitalists switch teams weekly. If 7PM-7AM, please contact night-coverage at [email or web address], password [email or web address] for  any overnight needs.  07/11/2018, 9:42 AM

## 2018-07-12 ENCOUNTER — Ambulatory Visit
Admit: 2018-07-12 | Discharge: 2018-07-12 | Disposition: A | Discharge status: Home / Self Care | Payer: Medicare Other | Attending: Radiation Oncology | Admitting: Radiation Oncology

## 2018-07-12 ENCOUNTER — Telehealth: Payer: Self-pay

## 2018-07-12 VITALS — BP 166/54 | HR 84 | Temp 98.2°F | Resp 28

## 2018-07-12 DIAGNOSIS — C3490 Malignant neoplasm of unspecified part of unspecified bronchus or lung: Secondary | ICD-10-CM

## 2018-07-12 DIAGNOSIS — C7931 Secondary malignant neoplasm of brain: Secondary | ICD-10-CM

## 2018-07-12 LAB — GLUCOSE, CAPILLARY
GLUCOSE-CAPILLARY: 102 mg/dL — AB (ref 70–99)
Glucose-Capillary: 166 mg/dL — ABNORMAL HIGH (ref 70–99)
Glucose-Capillary: 45 mg/dL — ABNORMAL LOW (ref 70–99)
Glucose-Capillary: 67 mg/dL — ABNORMAL LOW (ref 70–99)
Glucose-Capillary: 121 mg/dL — ABNORMAL HIGH (ref 70–99)

## 2018-07-12 NOTE — Telephone Encounter (Signed)
Per 12/6 no los

## 2018-07-12 NOTE — Progress Notes (Signed)
[facility] Therapy Treatment Record Phone [phone]   Radiation Therapy was administered to [name] on: 07/12/2018  5:13 PM and was treatment #  out of a planned course of  treatments.  Radiation Treatment  1). Beam photons with 6-10 energy  2). Brachytherapy None  3). Stereotactic Radiosurgery None  4). Other Radiation None     [name] A Shellyann Wandrey, RT (T)

## 2018-07-12 NOTE — Progress Notes (Signed)
  Radiation Oncology         [phone] ________________________________  Name: [name] MRN: [id number]  Date: 07/12/2018  DOB: 1946/05/09  SIMULATION AND TREATMENT PLANNING NOTE - Inpatient    DIAGNOSIS:  stage IV small cell carcinoma of the right lung with brain metastasis  NARRATIVE:  The patient was brought to the [facility].  Identity was confirmed.  All relevant records and images related to the planned course of therapy were reviewed.  The patient freely provided informed written consent to proceed with treatment after reviewing the details related to the planned course of therapy. The consent form was witnessed and verified by the simulation staff.  Then, the patient was set-up in a stable reproducible  supine position for radiation therapy.  CT images were obtained.  Surface markings were placed.  The CT images were loaded into the planning software.  Then the target and avoidance structures were contoured.  Treatment planning then occurred.  The radiation prescription was entered and confirmed.  Then, I designed and supervised the construction of a total of 5 medically necessary complex treatment devices.  I have requested : Isodose Plan.  I have ordered:dose calc.  PLAN:  The patient will receive 30 Gy in 10 fractions directed at the whole brain. First treatment later today.  -----------------------------------  Blair Promise, PhD, MD This document serves as a record of services personally performed by Gery Pray, MD. It was created on his behalf by Mary-Margaret Loma Messing, a trained medical scribe. The creation of this record is based on the scribe's personal observations and the provider's statements to them. This document has been checked and approved by the attending provider.

## 2018-07-12 NOTE — Progress Notes (Signed)
[facility] PROGRESS NOTE    Progress Note  [name]  HCW:[id number] DOB: [birth year] DOA: 07/08/2018 PCP: Susy Frizzle, MD     Brief Narrative:   [name] is an 72 y.o. female female with past medical history of hypertension paroxysmal atrial, with a history of recently diagnosed small cell lung cancer fibrillation COPD on oxygen diabetes mellitus type 2 chronic kidney disease who presents after witnessed seizure  Assessment/Plan:   Poorly differentiated lung cancer in adult/ Brain metastases (HCC)/Seizure [facility]): Decrease Decadron to IV 4 mg every 6 hours, MRI of the brain done. Radiation oncology consulted and recommended to start radiation hopefully today. Continue narcotics for pain control. Continue Decadron and Keppra for seizure prophylaxis. Consult physical therapy as the patient might benefit from skilled nursing facility.  Hyperkalemia: Did not receive her Kayexalate dose yesterday we will try to get it in today. Potassium continues to be high, continue to hold lisinopril. KCO IV fluids.  History of atrial fibrillation: Rate controlled continue current medication.  leuCytosis: Leukocytosis likely due to malignancy plus or minus steroids, she has remained febrile.  Normocytic anemia: Close to baseline.  Acute kidney injury on chronic kidney disease stage III: Likely prerenal azotemia resolved with IV fluid hydration her creatinine is less than 1 today.  Which is her baseline.  Obesity: Estimated body mass index is 41.7 kg/m as calculated from the following:   Height as of this encounter: 4\' 10"  (1.473 m).   Weight as of this encounter: 90.5 kg.  DVT prophylaxis: lovenox Family Communication:husband Disposition Plan/Barrier to D/C: once radiation started Code Status:     Code Status Orders  (From admission, onward)         Start     Ordered   07/09/18 0253  Full code  Continuous     07/09/18 0258        Code Status History     Date Active Date Inactive Code Status Order ID Comments User Context   06/18/2018 0318 06/19/2018 1835 Full Code [id number]  Shela Leff, MD Inpatient        IV Access:    Peripheral IV   Procedures and diagnostic studies:   Mr Jeri Cos Wo Contrast  Result Date: 07/12/2018 CLINICAL DATA:  Seizure.  History of lung cancer. EXAM: MRI HEAD WITHOUT AND WITH CONTRAST TECHNIQUE: Multiplanar, multiecho pulse sequences of the brain and surrounding structures were obtained without and with intravenous contrast. 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 Brain does not show a pattern of widespread small-vessel disease elsewhere. No hydrocephalus. No extra-axial collection. Incidental large venous angioma in the left cerebellum. Multiple intracranial metastases. -4 mm metastasis at the infra medial left cerebellum axial image 31. -6 mm metastasis within the left lateral cerebellum image 46. -5 mm of tasks cysts within the left temporal lobe image 63. -5 mm metastasis within the a left forceps major white matter image 96. -14 x 13 x 11 mm task assist in the left parietal lobe axial image 113. Mild associated vasogenic edema. Mildly hemorrhagic nature. -Punctate metastasis in the medial left parietal lobe axial image 119. -22 x 16 x 15 mm metastasis or 2 adjacent metastases at the left frontoparietal junction vertex axial image 125. Moderate associated vasogenic edema. Mildly hemorrhagic nature. Vascular: Major vessels at the base of the brain show flow. Skull and  upper cervical spine: Negative Sinuses/Orbits: Clear/normal Other: None IMPRESSION: Total of 7 cerebral and cerebellar metastases identified as outlined above. The largest is at the left frontoparietal vertex measuring 22 x 16 x 15 mm with moderate associated vasogenic edema. The 2  larger lesions show a mildly hemorrhagic nature. Electronically Signed   By: Nelson Chimes M.D.   On: 07/12/2018 07:54     Medical Consultants:    None.  Anti-Infectives:   None  Subjective:    [name] no seizures overnight, she had a bowel movement overnight. Objective:    Vitals:   07/11/18 1329 07/11/18 2117 07/11/18 2140 07/12/18 0522  BP:   (!) 185/69 (!) 169/66  Pulse:   78 77  Resp:   16 20  Temp:   97.6 F (36.4 C) 98.1 F (36.7 C)  TempSrc:   Oral Oral  SpO2: 90% 96% 92% 91%  Weight:      Height:        Intake/Output Summary (Last 24 hours) at 07/12/2018 0802 Last data filed at 07/11/2018 1739 Gross per 24 hour  Intake 1436.14 ml  Output 200 ml  Net 1236.14 ml   Filed Weights   07/08/18 2306 07/10/18 0054  Weight: 91.6 kg 90.5 kg    Exam: General exam: In no acute distress.  In a very good mood today Respiratory system: Good air movement and clear to auscultation Cardiovascular system: Rate and rhythm with a positive S1 and S2. Gastrointestinal system: Bowel sounds soft nontender nondistended Central nervous system: Alert and oriented x3 no focal deficits moving all 4 extremities without any difficulties. Extremities: No pedal edema. Skin: No rashes, lesions or ulcers Psychiatry: Judgment and insight appear normal.   Data Reviewed:    Labs: Basic Metabolic Panel: Recent Labs  Lab 07/08/18 2325 07/08/18 2331 07/09/18 0536 07/09/18 1855 07/11/18 0505  NA 144 140 144  --  143  K 4.9 4.7 5.3* 5.5* 5.3*  CL 94* 93* 97*  --  99  CO2 39*  --  39*  --  34*  GLUCOSE 98 96 99  --  134*  BUN 36* 37* 34*  --  36*  CREATININE 1.28* 1.20* 1.17*  --  0.90  CALCIUM 8.8*  --  8.3*  --  8.1*   GFR Estimated Creatinine Clearance: 53.3 mL/min (by C-G formula based on SCr of 0.9 mg/dL). Liver Function Tests: Recent Labs  Lab 07/08/18 2325 07/09/18 0536 07/11/18 0505  AST 19 19 17   ALT 14 15 13   ALKPHOS 53 49 46  BILITOT 0.4 0.3 0.4    PROT 6.4* 5.9* 6.2*  ALBUMIN 2.7* 2.5* 2.9*   No results for input(s): LIPASE, AMYLASE in the last 168 hours. No results for input(s): AMMONIA in the last 168 hours. Coagulation profile Recent Labs  Lab 07/07/18 0935 07/08/18 2325  INR 1.02 0.95    CBC: Recent Labs  Lab 07/08/18 2325 07/08/18 2331 07/09/18 0420  WBC 17.0*  --  11.8*  NEUTROABS 14.8*  --   --   HGB 11.4* 12.6 8.9*  HCT 39.1 37.0 30.8*  MCV 98.0  --  99.7  PLT 173  --  140*   Cardiac Enzymes: No results for input(s): CKTOTAL, CKMB, CKMBINDEX, TROPONINI in the last 168 hours. BNP (last 3 results) No results for input(s): PROBNP in the last 8760 hours. CBG: Recent Labs  Lab 07/10/18 2055 07/11/18 0733 07/11/18 1049 07/11/18 1645 07/11/18 2140  GLUCAP 121* 114* 173* 92 124*  D-Dimer: No results for input(s): DDIMER in the last 72 hours. Hgb A1c: No results for input(s): HGBA1C in the last 72 hours. Lipid Profile: No results for input(s): CHOL, HDL, LDLCALC, TRIG, CHOLHDL, LDLDIRECT in the last 72 hours. Thyroid function studies: No results for input(s): TSH, T4TOTAL, T3FREE, THYROIDAB in the last 72 hours.  Invalid input(s): FREET3 Anemia work up: No results for input(s): VITAMINB12, FOLATE, FERRITIN, TIBC, IRON, RETICCTPCT in the last 72 hours. Sepsis Labs: Recent Labs  Lab 07/08/18 2325 07/09/18 0420  WBC 17.0* 11.8*   Microbiology No results found for this or any previous visit (from the past 240 hour(s)).   Medications:   . aspirin EC  81 mg Oral Daily  . clopidogrel  75 mg Oral Daily  . dexamethasone  4 mg Intravenous Q6H  . insulin aspart  0-15 Units Subcutaneous TID WC  . insulin aspart  0-5 Units Subcutaneous QHS  . insulin detemir  50 Units Subcutaneous BID  . ipratropium-albuterol  3 mL Nebulization TID  . levETIRAcetam  500 mg Oral BID  . metoCLOPramide  5 mg Oral BID  . pantoprazole  40 mg Oral Daily  . polyethylene glycol  17 g Oral Daily  . rosuvastatin  10 mg  Oral Daily  . sodium chloride flush  3 mL Intravenous Q12H   Continuous Infusions:   LOS: 3 days   [name]  [facility]   *Please refer to [email or web address], password [email or web address] to get updated schedule on who will round on this patient, as hospitalists switch teams weekly. If 7PM-7AM, please contact night-coverage at [email or web address], password [email or web address] for any overnight needs.  07/12/2018, 8:02 AM

## 2018-07-12 NOTE — Progress Notes (Signed)
  Radiation Oncology         [phone] ________________________________  Name: [name] MRN: [id number]  Date: 07/12/2018  DOB: 1946/04/07  Simulation Verification Note    ICD-10-CM   1. Small cell lung cancer in adult [facility]) C34.90   2. Brain metastases ([place]) C79.31     Status: inpatient  NARRATIVE: The patient was brought to the treatment unit and placed in the planned treatment position. The clinical setup was verified. Then port films were obtained and uploaded to the radiation oncology medical record software.  The treatment beams were carefully compared against the planned radiation fields. The position location and shape of the radiation fields was reviewed. They targeted volume of tissue appears to be appropriately covered by the radiation beams. Organs at risk appear to be excluded as planned.  Based on my personal review, I approved the simulation verification. The patient's treatment will proceed as planned.  -----------------------------------  Blair Promise, PhD, MD  This document serves as a record of services personally performed by Gery Pray, MD. It was created on his behalf by Mary-Margaret Loma Messing, a trained medical scribe. The creation of this record is based on the scribe's personal observations and the provider's statements to them. This document has been checked and approved by the attending provider.

## 2018-07-12 NOTE — Care Management Important Message (Signed)
Important Message  Patient Details  Name: [name] MRN: [id number] Date of Birth: September 27, 1945   Medicare Important Message Given:  Yes    Kerin Salen 07/12/2018, 12:15 [place] Message  Patient Details  Name: [name] MRN: [id number] Date of Birth: 1946-04-27   Medicare Important Message Given:  Yes    Kerin Salen 07/12/2018, 12:15 PM

## 2018-07-13 ENCOUNTER — Ambulatory Visit
Admit: 2018-07-13 | Discharge: 2018-07-13 | Disposition: A | Discharge status: Home / Self Care | Payer: Medicare Other | Attending: Radiation Oncology | Admitting: Radiation Oncology

## 2018-07-13 DIAGNOSIS — Z515 Encounter for palliative care: Secondary | ICD-10-CM

## 2018-07-13 DIAGNOSIS — Z7189 Other specified counseling: Secondary | ICD-10-CM

## 2018-07-13 LAB — BASIC METABOLIC PANEL
Anion gap: 6 (ref 5–15)
BUN: 37 mg/dL — AB (ref 8–23)
CALCIUM: 8.1 mg/dL — AB (ref 8.9–10.3)
CO2: 36 mmol/L — ABNORMAL HIGH (ref 22–32)
Chloride: 102 mmol/L (ref 98–111)
Creatinine, Ser: 0.87 mg/dL (ref 0.44–1.00)
GFR calc non Af Amer: >60 mL/min (ref >60)
Glucose, Bld: 73 mg/dL (ref 70–99)
Potassium: 5.1 mmol/L (ref 3.5–5.1)
SODIUM: 144 mmol/L (ref 135–145)

## 2018-07-13 LAB — GLUCOSE, CAPILLARY
Glucose-Capillary: 44 mg/dL — CL (ref 70–99)
Glucose-Capillary: 103 mg/dL — ABNORMAL HIGH (ref 70–99)
Glucose-Capillary: 70 mg/dL (ref 70–99)
Glucose-Capillary: 69 mg/dL — ABNORMAL LOW (ref 70–99)
Glucose-Capillary: 135 mg/dL — ABNORMAL HIGH (ref 70–99)
Glucose-Capillary: 176 mg/dL — ABNORMAL HIGH (ref 70–99)
Glucose-Capillary: 98 mg/dL (ref 70–99)

## 2018-07-13 MED ORDER — FUROSEMIDE 40 MG PO TABS
40.0000 mg | ORAL_TABLET | Freq: Every day | ORAL | Status: DC
  Administered 2018-07-13 – 2018-07-15 (×3): 40 mg via ORAL
  Filled 2018-07-13 (×4): qty 1

## 2018-07-13 MED ORDER — INSULIN DETEMIR 100 UNIT/ML ~~LOC~~ SOLN
35.0000 [IU] | Freq: Two times a day (BID) | SUBCUTANEOUS | Status: DC
  Administered 2018-07-13: 35 [IU] via SUBCUTANEOUS
  Filled 2018-07-13 (×2): qty 0.35

## 2018-07-13 MED ORDER — LISINOPRIL 20 MG PO TABS
20.0000 mg | ORAL_TABLET | Freq: Every day | ORAL | Status: DC
  Administered 2018-07-13: 20 mg via ORAL
  Filled 2018-07-13: qty 1

## 2018-07-13 MED ORDER — SODIUM POLYSTYRENE SULFONATE 15 GM/60ML PO SUSP
15.0000 g | Freq: Once | ORAL | Status: DC
  Filled 2018-07-13: qty 60

## 2018-07-13 MED ORDER — HYDRALAZINE HCL 20 MG/ML IJ SOLN
5.0000 mg | Freq: Four times a day (QID) | INTRAMUSCULAR | Status: DC | PRN
  Administered 2018-07-13 – 2018-07-16 (×2): 5 mg via INTRAVENOUS
  Filled 2018-07-13 (×2): qty 1

## 2018-07-13 MED ORDER — AMLODIPINE BESYLATE 10 MG PO TABS
10.0000 mg | ORAL_TABLET | Freq: Every day | ORAL | Status: DC
  Administered 2018-07-13 – 2018-07-16 (×4): 10 mg via ORAL
  Filled 2018-07-13 (×4): qty 1

## 2018-07-13 MED ORDER — INSULIN DETEMIR 100 UNIT/ML ~~LOC~~ SOLN
5.0000 [IU] | Freq: Two times a day (BID) | SUBCUTANEOUS | Status: DC
  Administered 2018-07-13 – 2018-07-16 (×7): 5 [IU] via SUBCUTANEOUS
  Filled 2018-07-13 (×7): qty 0.05

## 2018-07-13 MED ORDER — HYDRALAZINE HCL 25 MG PO TABS
25.0000 mg | ORAL_TABLET | Freq: Four times a day (QID) | ORAL | Status: DC
  Administered 2018-07-13 – 2018-07-16 (×14): 25 mg via ORAL
  Filled 2018-07-13 (×14): qty 1

## 2018-07-13 NOTE — Progress Notes (Signed)
PMT progress note   Patient is awake alert resting in bed In no distress She is awaiting her radiation appointment today Appreciate spiritual care follow up and addressing advanced directives.  Med onc to follow up as well.   PMT to continue to follow, based on hospital course and disease trajectory.  Note PT recommendations for SNF  BP (!) 155/53 (BP Location: Right Arm)   Pulse 79   Temp 98.7 F (37.1 C) (Oral)   Resp 18   Ht 4\' 10"  (1.473 m)   Wt 90.5 kg   SpO2 95%   BMI 41.70 kg/m  Labs and imaging noted Chart reviewed  15 minutes spent Loistine Chance MD [facility] health palliative medicine team [phone]

## 2018-07-13 NOTE — Progress Notes (Signed)
Hypoglycemic Event  CBG: 69  Treatment: 8 oz juice/soda plus crackers  Symptoms: None  Follow-up CBG: Time:1347 CBG Result:135  Possible Reasons for Event: Medication related/unknown  Comments/MD notified: Dr. Aileen Fass paged    Janyce Ellinger, [place] Major

## 2018-07-13 NOTE — Progress Notes (Signed)
Palliative care progress note  Reason for consult: Goals of care in light of metastatic cancer  I met today with patient and her friend/roomate, Bryan.  We discussed clinical course as well as wishes moving forward in regard to advanced directives.  Concepts specific to code status and rehospitalization discussed.   Questions and concerns addressed.   PMT will continue to support holistically.  - Full code/Full scope - Patient would like to update her advance directive.  I placed a consult to spiritual care - I left a copy of MOST form and Hard Choices for Loving People to review - [name] will call if they would like to meet further this admission  Total time: 50 minutes Greater than 50%  of this time was spent counseling and coordinating care related to the above assessment and plan.  Micheline Rough, MD [place] Team [phone]

## 2018-07-13 NOTE — Progress Notes (Signed)
PT Cancellation Note  Patient Details Name: [name] MRN: [id number] DOB: 13-Dec-1945   Cancelled Treatment:    Reason Eval/Treat Not Completed: Medical issues which prohibited therapy, per Rn, fatigued. Will check back another time.   Claretha Cooper 07/13/2018, 1:21 PM [place] Pager [phone] Office [phone]

## 2018-07-13 NOTE — Progress Notes (Signed)
[facility] PROGRESS NOTE    Progress Note  [name]  RXV:[id number] DOB: [birth year] DOA: 07/08/2018 PCP: Susy Frizzle, MD     Brief Narrative:   [name] is an 72 y.o. female female with past medical history of hypertension paroxysmal atrial, with a history of recently diagnosed small cell lung cancer fibrillation COPD on oxygen diabetes mellitus type 2 chronic kidney disease who presents after witnessed seizure  Assessment/Plan:   Poorly differentiated lung cancer in adult/ Brain metastases (HCC)/Seizure [facility]): Continue Decadron to IV 4 mg every 6 hours, MRI of the brain done. Radiation oncology consulted and she has been getting radiation daily. Continue narcotics for pain control. Continue Decadron and Keppra for seizure prophylaxis. Consult physical therapy as the patient might benefit from skilled nursing facility. Dr. Earlie Server will be back on 07/14/2018. Waiting physical therapy recommendations.  Hyperkalemia: Potassium has remained borderline high continue to hold lisinopril given additional dose of Kayexalate. KVO IV fluids. She is not on any supplements she has had a bowel movement.  History of atrial fibrillation: Rate controlled continue current medication.  leuCytosis: Leukocytosis likely due to malignancy plus or minus steroids, she has remained febrile.  Normocytic anemia: Close to baseline.  Acute kidney injury on chronic kidney disease stage III: Likely prerenal azotemia resolved with IV fluid hydration her creatinine is less than 1 today.  Which is her baseline.  Obesity: Estimated body mass index is 41.7 kg/m as calculated from the following:   Height as of this encounter: 4\' 10"  (1.473 m).   Weight as of this encounter: 90.5 kg.  DVT prophylaxis: lovenox Family Communication:husband Disposition Plan/Barrier to D/C: once radiation started Code Status:     Code Status Orders  (From admission, onward)         Start      Ordered   07/09/18 0253  Full code  Continuous     07/09/18 0258        Code Status History    Date Active Date Inactive Code Status Order ID Comments User Context   06/18/2018 0318 06/19/2018 1835 Full Code [id number]  Shela Leff, MD Inpatient        IV Access:    Peripheral IV   Procedures and diagnostic studies:   Mr Jeri Cos Wo Contrast  Result Date: 07/12/2018 CLINICAL DATA:  Seizure.  History of lung cancer. EXAM: MRI HEAD WITHOUT AND WITH CONTRAST TECHNIQUE: Multiplanar, multiecho pulse sequences of the brain and surrounding structures were obtained without and with intravenous contrast. 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 Brain does not show a pattern of widespread small-vessel disease elsewhere. No hydrocephalus. No extra-axial collection. Incidental large venous angioma in the left cerebellum. Multiple intracranial metastases. -4 mm metastasis at the infra medial left cerebellum axial image 31. -6 mm metastasis within the left lateral cerebellum image 46. -5 mm of tasks cysts within the left temporal lobe image 63. -5 mm metastasis within the a left forceps major white matter image 96. -14 x 13 x 11 mm task assist in the left parietal lobe axial image 113. Mild associated vasogenic edema. Mildly hemorrhagic nature. -Punctate metastasis in the medial left parietal lobe axial image 119. -22 x 16 x 15 mm metastasis or 2 adjacent metastases at the left frontoparietal junction vertex axial image 125. Moderate associated vasogenic edema. Mildly hemorrhagic nature.  Vascular: Major vessels at the base of the brain show flow. Skull and upper cervical spine: Negative Sinuses/Orbits: Clear/normal Other: None IMPRESSION: Total of 7 cerebral and cerebellar metastases identified as outlined above. The largest is at the left  frontoparietal vertex measuring 22 x 16 x 15 mm with moderate associated vasogenic edema. The 2 larger lesions show a mildly hemorrhagic nature. Electronically Signed   By: Nelson Chimes M.D.   On: 07/12/2018 07:54     Medical Consultants:    None.  Anti-Infectives:   None  Subjective:    [name] no seizures overnight, good mood. Objective:    Vitals:   07/12/18 1601 07/12/18 2119 07/12/18 2126 07/13/18 0536  BP: (!) 166/89 (!) 179/72  (!) 184/61  Pulse: 80 78  73  Resp: 16 18  16   Temp: 98.1 F (36.7 C) 97.8 F (36.6 C)  97.6 F (36.4 C)  TempSrc: Oral Oral  Oral  SpO2: 91% 92% 91% 95%  Weight:      Height:        Intake/Output Summary (Last 24 hours) at 07/13/2018 0936 Last data filed at 07/13/2018 0900 Gross per 24 hour  Intake 720 ml  Output 1250 ml  Net -530 ml   Filed Weights   07/08/18 2306 07/10/18 0054  Weight: 91.6 kg 90.5 kg    Exam: General exam: In no acute distress.  Morbidly obese female Respiratory system: Good air movement and clear to auscultation Cardiovascular system: Regular rate and rhythm with positive S1-S2 Gastrointestinal system: Bowel sounds soft nontender nondistended Central nervous system: Alert and oriented x3 no focal deficits moving all 4 extremities without any difficulties. Extremities: No pedal edema. Skin: No rashes, lesions or ulcers Psychiatry: Judgment and insight appear normal.   Data Reviewed:    Labs: Basic Metabolic Panel: Recent Labs  Lab 07/08/18 2325 07/08/18 2331 07/09/18 0536  07/11/18 0505 07/13/18 0455  NA 144 140 144  --  143 144  K 4.9 4.7 5.3*   < > 5.3* 5.1  CL 94* 93* 97*  --  99 102  CO2 39*  --  39*  --  34* 36*  GLUCOSE 98 96 99  --  134* 73  BUN 36* 37* 34*  --  36* 37*  CREATININE 1.28* 1.20* 1.17*  --  0.90 0.87  CALCIUM 8.8*  --  8.3*  --  8.1* 8.1*   < > = values in this interval not displayed.   GFR Estimated Creatinine Clearance: 55.2 mL/min (by C-G formula based  on SCr of 0.87 mg/dL). Liver Function Tests: Recent Labs  Lab 07/08/18 2325 07/09/18 0536 07/11/18 0505  AST 19 19 17   ALT 14 15 13   ALKPHOS 53 49 46  BILITOT 0.4 0.3 0.4  PROT 6.4* 5.9* 6.2*  ALBUMIN 2.7* 2.5* 2.9*   No results for input(s): LIPASE, AMYLASE in the last 168 hours. No results for input(s): AMMONIA in the last 168 hours. Coagulation profile Recent Labs  Lab 07/07/18 0935 07/08/18 2325  INR 1.02 0.95    CBC: Recent Labs  Lab 07/08/18 2325 07/08/18 2331 07/09/18 0420  WBC 17.0*  --  11.8*  NEUTROABS 14.8*  --   --   HGB 11.4* 12.6 8.9*  HCT 39.1 37.0 30.8*  MCV 98.0  --  99.7  PLT 173  --  140*   Cardiac Enzymes: No results for input(s): CKTOTAL, CKMB, CKMBINDEX, TROPONINI in the last 168 hours. BNP (last 3 results) No results for input(s):  PROBNP in the last 8760 hours. CBG: Recent Labs  Lab 07/12/18 1744 07/12/18 1801 07/12/18 2117 07/13/18 0736 07/13/18 0814  GLUCAP 67* 102* 121* 70 103*   D-Dimer: No results for input(s): DDIMER in the last 72 hours. Hgb A1c: No results for input(s): HGBA1C in the last 72 hours. Lipid Profile: No results for input(s): CHOL, HDL, LDLCALC, TRIG, CHOLHDL, LDLDIRECT in the last 72 hours. Thyroid function studies: No results for input(s): TSH, T4TOTAL, T3FREE, THYROIDAB in the last 72 hours.  Invalid input(s): FREET3 Anemia work up: No results for input(s): VITAMINB12, FOLATE, FERRITIN, TIBC, IRON, RETICCTPCT in the last 72 hours. Sepsis Labs: Recent Labs  Lab 07/08/18 2325 07/09/18 0420  WBC 17.0* 11.8*   Microbiology No results found for this or any previous visit (from the past 240 hour(s)).   Medications:   . aspirin EC  81 mg Oral Daily  . clopidogrel  75 mg Oral Daily  . dexamethasone  4 mg Intravenous Q6H  . hydrALAZINE  25 mg Oral Q6H  . insulin aspart  0-15 Units Subcutaneous TID WC  . insulin aspart  0-5 Units Subcutaneous QHS  . insulin detemir  50 Units Subcutaneous BID  .  ipratropium-albuterol  3 mL Nebulization TID  . levETIRAcetam  500 mg Oral BID  . metoCLOPramide  5 mg Oral BID  . pantoprazole  40 mg Oral Daily  . polyethylene glycol  17 g Oral Daily  . rosuvastatin  10 mg Oral Daily  . sodium chloride flush  3 mL Intravenous Q12H   Continuous Infusions:   LOS: 4 days   [name]  [facility]   *Please refer to [email or web address], password [email or web address] to get updated schedule on who will round on this patient, as hospitalists switch teams weekly. If 7PM-7AM, please contact night-coverage at [email or web address], password [email or web address] for any overnight needs.  07/13/2018, 9:36 AM

## 2018-07-13 NOTE — Evaluation (Addendum)
64.Physical Therapy Evaluation Patient Details Name: [name] MRN: [id number] DOB: 1945/09/11 Today's Date: 07/13/2018   History of Present Illness  [name] is an 72 y.o. female female with past medical history of hypertension paroxysmal atrial, with a history of recently diagnosed small cell lung cancer fibrillation COPD on oxygen diabetes mellitus type 2 chronic kidney disease who presents after witnessed seizure. MRI demonstrates frontoparietal and cerebellar lesions.  Clinical Impression  The patient  Required 2 HHA , possibly on 1 assist if used the Rw. Patient reports home alone while friends work. Currently, patient requires assistance. Continue PT recommend OT. Pt admitted with above diagnosis. Pt currently with functional limitations due to the deficits listed below (see PT Problem List). Pt will benefit from skilled PT to increase their independence and safety with mobility to allow discharge to the venue listed below.   Sts  >90% on 4 liters, BP 194/    Follow Up Recommendations Home health PT; vsSNF    Equipment Recommendations  None recommended by PT    Recommendations for Other Services   OT    Precautions / Restrictions Precautions Precautions: Fall Precaution Comments: urgency to urinate, on Home oxygen used 4 Liters this visit.      Mobility  Bed Mobility Overal bed mobility: Needs Assistance Bed Mobility: Supine to Sit     Supine to sit: Min assist     General bed mobility comments: assist with trunk  Transfers Overall transfer level: Needs assistance Equipment used: 2 person hand held assist Transfers: Sit to/from Stand Sit to Stand: Mod assist;+2 safety/equipment;+2 physical assistance         General transfer comment: patient in a hurry to amb. to BR. 2 arm hold assist  Ambulation/Gait Ambulation/Gait assistance: Mod assist;+2 physical assistance;+2 safety/equipment Gait Distance (Feet): 10 Feet(then 15') Assistive device: 2  person hand held assist Gait Pattern/deviations: Step-to pattern;Step-through pattern     General Gait Details: tends to amb sideways somewhat, noted 3/4 Dyspnea  Stairs            Wheelchair Mobility    Modified Rankin (Stroke Patients Only)       Balance Overall balance assessment: Needs assistance         Standing balance support: During functional activity;Bilateral upper extremity supported Standing balance-Leahy Scale: Fair Standing balance comment: needs assist                             Pertinent Vitals/Pain Pain Assessment: No/denies pain    Home Living Family/patient expects to be discharged to:: Private residence Living Arrangements: Non-relatives/Friends Available Help at Discharge: Friend(s) Type of Home: House Home Access: Stairs to enter   [employer] of Steps: 3 Home Layout: One level Home Equipment: Walker - 2 wheels;Bedside commode      Prior Function Level of Independence: Needs assistance   Gait / Transfers Assistance Needed: does not use device in house  ADL's / Homemaking Assistance Needed: does own bath, dressing, amb. to BR   patient sleeps in a recliner     Hand Dominance        Extremity/Trunk Assessment   Upper Extremity Assessment Upper Extremity Assessment: Defer to OT evaluation    Lower Extremity Assessment Lower Extremity Assessment: Generalized weakness    Cervical / Trunk Assessment Cervical / Trunk Assessment: Kyphotic  Communication   Communication: No difficulties  Cognition Arousal/Alertness: Awake/alert Behavior During Therapy: WFL for tasks assessed/performed;Impulsive Overall Cognitive Status:  Within Functional Limits for tasks assessed                                 General Comments: urgency to go to [facility]      General Comments      Exercises     Assessment/Plan    PT Assessment Patient needs continued PT services  PT Problem List Decreased  strength;Decreased activity tolerance;Decreased mobility;Decreased knowledge of precautions;Decreased safety awareness;Cardiopulmonary status limiting activity;Decreased knowledge of use of DME       PT Treatment Interventions DME instruction;Gait training;Functional mobility training;Therapeutic activities;Therapeutic exercise;Patient/family education    PT Goals (Current goals can be found in the Care Plan section)  Acute Rehab PT Goals Patient Stated Goal: I want to really go home PT Goal Formulation: With patient Time For Goal Achievement: Aug 22, 2018 Potential to Achieve Goals: Good    Frequency Min 2X/week   Barriers to discharge Decreased caregiver support home alone while friends work    Co-evaluation               AM-PAC PT "6 Clicks" Mobility  Outcome Measure Help needed turning from your back to your side while in a flat bed without using bedrails?: A Lot Help needed moving from lying on your back to sitting on the side of a flat bed without using bedrails?: A Lot Help needed moving to and from a bed to a chair (including a wheelchair)?: A Lot Help needed standing up from a chair using your arms (e.g., wheelchair or bedside chair)?: A Lot Help needed to walk in hospital room?: A Lot Help needed climbing 3-5 steps with a railing? : Total 6 Click Score: 11    End of Session   Activity Tolerance: Patient limited by fatigue Patient left: in chair;with call bell/phone within reach;with chair alarm set Nurse Communication: Mobility status PT Visit Diagnosis: Unsteadiness on feet (R26.81)    Time: 2248-2500 PT Time Calculation (min) (ACUTE ONLY): 25 min   Charges:   PT Evaluation $PT Eval Low Complexity: 1 Low PT Treatments $Gait Training: 8-22 mins        [place] Pager [phone] Office [phone]   Claretha Cooper 07/13/2018, 3:56 PM

## 2018-07-13 NOTE — Progress Notes (Signed)
Hypoglycemic Event  CBG: 70 (borderline hypoglycemic)  Treatment: 4 oz juice/soda (breakfast had been ordered)  Symptoms: None  Follow-up CBG: FRTM:2111 CBG Result:103  Possible Reasons for Event: Unknown  Comments/MD notified: Will inform MD during rounds, patient not technically hypoglycemic per protocol. Patient asymptomatic    [name], Dudley Major

## 2018-07-13 NOTE — Progress Notes (Signed)
   07/13/18 0943 07/13/18 1043  Vitals  BP (!) 210/71 (!) 171/56  BP Location  --  Right Arm  BP Method  --  Automatic  Patient Position (if appropriate)  --  Lying  Pulse Rate 81 76  Pulse Rate Source  --  Dinamap  Resp  --  16  Oxygen Therapy  SpO2 92 % 94 %  O2 Device Nasal Cannula Nasal Cannula  O2 Flow Rate (L/min) 4 L/min 4 L/min   MD made aware of patient's BP of 210/71. New orders received. Medication given and BP rechecked improved to 171/56. Will continue to monitor BP.

## 2018-07-13 NOTE — Progress Notes (Addendum)
Completed advance directive with pt.  Copy in Pt chart.  Pt with original and copies. [name] expresses desire to complete MOST form. She has this form at bedside.  Reported this to RN, who will contact physician    Provided spiritual support around radiation treatment and ca diagnosis.  Pt's faith figures prominently in her coping.  She expresses feeling that God is not ready for her and trusts that she will be healed.  Prays with chaplain and friends for healing.  Also expresses trust in [employer] and states "when God is ready for me, he will call me."    [name] is supported at bedside by friends Lafayette Dragon, and Grayland Jack, MDiv, [facility]

## 2018-07-13 NOTE — Progress Notes (Signed)
Provided support around advance directive, emotional and spiritual support.   Pt wishes to complete advance directive with friend, [name].  Provided documents.  She and [name] will go over Chief Operating Officer.  Chaplain will follow up tomorrow to notarize when [name] is present at hospital.    Provided support around Shawntel's first radiation treatment.  She expresses fear and uncertainty.  Relates her reliance on God and belief that God has a plan for her.  She takes joy in reflecting on this and requests prayers with chaplain     Jerene Pitch, MDiv, [facility]

## 2018-07-13 NOTE — Progress Notes (Signed)
Patient along with family/friends at bedside expressed that she wishes to now be a DNR and requesting the MD to complete the MOST form. Paged MD with patient/family request.

## 2018-07-13 NOTE — Progress Notes (Signed)
Hypoglycemic Event  CBG: 45  Treatment: 4oz orange juice  Symptoms: Asymptomatic, patient alert, talking after being brought back from radiation. Starting to eat dinner when this occurred.  Follow-up CBG: Time: 1745 CBG Result: 67  Possible Reasons for Event: brain radiation?  Follow-up CBG time: 1800 CBG Result: 102

## 2018-07-13 NOTE — Progress Notes (Signed)
   07/13/18 0900  Clinical Encounter Type  Visited With Patient  Visit Type Initial;Psychological support;Spiritual support  Referral From Nurse  Consult/Referral To Chaplain  Spiritual Encounters  Spiritual Needs Brochure;Other (Comment)  Stress Factors  Patient Stress Factors Other (Comment) (Advance Directive Completion )   I visited with the patient per Spiritual Care consult. The patient wants to complete her Advance Directive once her friend, [name], comes back up to the hospital this afternoon. I let our other staff chaplain, Edwena Felty, know and he plans on following up.   Please, contact Spiritual Care for further assistance.   Chaplain Shanon Ace M.Div., [facility]

## 2018-07-14 ENCOUNTER — Other Ambulatory Visit: Payer: Self-pay | Admitting: Family Medicine

## 2018-07-14 ENCOUNTER — Other Ambulatory Visit: Payer: Self-pay | Admitting: Cardiovascular Disease

## 2018-07-14 ENCOUNTER — Ambulatory Visit
Admit: 2018-07-14 | Discharge: 2018-07-14 | Disposition: A | Discharge status: Home / Self Care | Payer: Medicare Other | Attending: Radiation Oncology | Admitting: Radiation Oncology

## 2018-07-14 LAB — BASIC METABOLIC PANEL
Anion gap: 9 (ref 5–15)
BUN: 40 mg/dL — ABNORMAL HIGH (ref 8–23)
CO2: 34 mmol/L — ABNORMAL HIGH (ref 22–32)
Calcium: 8.2 mg/dL — ABNORMAL LOW (ref 8.9–10.3)
Chloride: 100 mmol/L (ref 98–111)
Creatinine, Ser: 0.9 mg/dL (ref 0.44–1.00)
Glucose, Bld: 102 mg/dL — ABNORMAL HIGH (ref 70–99)
Potassium: 5 mmol/L (ref 3.5–5.1)
Sodium: 143 mmol/L (ref 135–145)

## 2018-07-14 LAB — GLUCOSE, CAPILLARY
Glucose-Capillary: 100 mg/dL — ABNORMAL HIGH (ref 70–99)
Glucose-Capillary: 145 mg/dL — ABNORMAL HIGH (ref 70–99)
Glucose-Capillary: 136 mg/dL — ABNORMAL HIGH (ref 70–99)
Glucose-Capillary: 178 mg/dL — ABNORMAL HIGH (ref 70–99)

## 2018-07-14 MED ORDER — GLUCERNA SHAKE PO LIQD
237.0000 mL | Freq: Three times a day (TID) | ORAL | Status: DC
  Administered 2018-07-14 – 2018-07-16 (×3): 237 mL via ORAL
  Filled 2018-07-14 (×7): qty 237

## 2018-07-14 NOTE — Consult Note (Signed)
   [facility] CM Inpatient Consult   07/14/2018  [name] 04-10-1946 [id number]    Patient screened for potential [facility] Care Management services due to unplanned readmission risk score of 27% (high).   Went to bedside to speak with Ms. [name] about [place] Management program. [name] states she plans on going to SNF at discharge. Pleasantly declines [facility] Care Management at this time. Provided [facility] Care Management brochure with contact information to contact in the future.  Made inpatient RNCM aware [place] Management program services were declined.    Marthenia Rolling, MSN-Ed, RN,BSN [facility] Liaison [phone]

## 2018-07-14 NOTE — Telephone Encounter (Signed)
Rx request sent to pharmacy.  

## 2018-07-14 NOTE — Telephone Encounter (Signed)
Thanks for the update.   [name]

## 2018-07-14 NOTE — Progress Notes (Signed)
Occupational Therapy Evaluation Patient Details Name: [name] MRN: [id number] DOB: 1945/10/12 Today's Date: 07/14/2018    History of Present Illness [name] is an 72 y.o. female female with past medical history of hypertension paroxysmal atrial, with a history of recently diagnosed small cell lung cancer fibrillation COPD on oxygen diabetes mellitus type 2 chronic kidney disease who presents after witnessed seizure. MRI demonstrates frontoparietal and cerebellar lesions.   Clinical Impression   PTA, pt lived at home with friend of 30 yrs and was independent with ADL and mobility until recently where her friend [name]) assisted with ADL due to fatigue. Pt fatigues quickly and demonstrates dyspnea of 3/4 with toilet transfer and hygiene. Overall mod A with LB ADL and min A with limited mobility. Pt will benefit from rehab at SNF to facilitate safe DC home. Pt appreciative of help.     Follow Up Recommendations  SNF;Supervision/Assistance - 24 hour    Equipment Recommendations  None recommended by OT    Recommendations for Other Services       Precautions / Restrictions Precautions Precautions: Fall Precaution Comments: urgency to urinate, on Home oxygen      Mobility Bed Mobility Overal bed mobility: Needs Assistance Bed Mobility: Supine to Sit     Supine to sit: Min assist        Transfers Overall transfer level: Needs assistance Equipment used: 1 person hand held assist Transfers: Sit to/from [facility] Sit to Stand: Min assist Stand pivot transfers: Min assist            Balance Overall balance assessment: Needs assistance   Sitting balance-Leahy Scale: Good       Standing balance-Leahy Scale: Fair                             ADL either performed or assessed with clinical judgement   ADL Overall ADL's : Needs assistance/impaired     Grooming: Set up;Supervision/safety;Sitting   Upper Body Bathing:  Supervision/ safety;Set up;Sitting   Lower Body Bathing: Moderate assistance;Sit to/from stand   Upper Body Dressing : Supervision/safety;Set up;Sitting   Lower Body Dressing: Moderate assistance;Sit to/from stand   Toilet Transfer: Minimal assistance;Stand-pivot;BSC   Toileting- Clothing Manipulation and Hygiene: Moderate assistance       Functional mobility during ADLs: Minimal assistance;Cueing for safety General ADL Comments: Mobility limited for ADL dueto fatigue; Pt ableto pivot onto BSC, use the bathroom then pivot into recliner. Pt states "I'm wearing out". unable to walk afterwards; dyspnea 3/4.     Vision Baseline Vision/History: Wears glasses       Perception     Praxis      Pertinent Vitals/Pain Pain Assessment: No/denies pain     Hand Dominance Right   Extremity/Trunk Assessment Upper Extremity Assessment Upper Extremity Assessment: Generalized weakness   Lower Extremity Assessment Lower Extremity Assessment: Generalized weakness   Cervical / Trunk Assessment Cervical / Trunk Assessment: Kyphotic   Communication Communication Communication: No difficulties   Cognition Arousal/Alertness: Awake/alert Behavior During Therapy: WFL for tasks assessed/performed Overall Cognitive Status: Impaired/Different from baseline Area of Impairment: Attention;Safety/judgement;Awareness;Problem solving                   Current Attention Level: Selective     Safety/Judgement: Decreased awareness of safety Awareness: Emergent Problem Solving: Slow processing General Comments: Difficulty wtih understanding steps to usig theraband   General Comments       Exercises Exercises:  Other exercises Other Exercises Other Exercises: general level 1 theraband exercises   Shoulder Instructions      Home Living Family/patient expects to be discharged to:: [place]: [name] - 2  wheels;Shower seat;Bedside commode          Prior Functioning/Environment Level of Independence: Independent with assistive device(s)  Gait / Transfers Assistance Needed: does not use device in house ADL's / Homemaking Assistance Needed: does own bath, dressing, amb. to BR   Comments: recently started using shower chair and having to have assistance with bating just PTA        OT Problem List: Decreased strength;Decreased activity tolerance;Impaired balance (sitting and/or standing);Decreased safety awareness;Decreased cognition;Decreased knowledge of use of DME or AE;Cardiopulmonary status limiting activity;Obesity      OT Treatment/Interventions: Self-care/ADL training;Therapeutic exercise;Energy conservation;DME and/or AE instruction;Therapeutic activities;Cognitive remediation/compensation;Patient/family education;Balance training    OT Goals(Current goals can be found in the care plan section) Acute Rehab OT Goals Patient Stated Goal: to get stronger OT Goal Formulation: With patient/family Time For Goal Achievement: 07/28/18 Potential to Achieve Goals: Good  OT Frequency: Min 2X/week   Barriers to D/C: Decreased caregiver support          Co-evaluation              AM-PAC OT "6 Clicks" Daily Activity     Outcome Measure Help from another person eating meals?: None Help from another person taking care of personal grooming?: A Little Help from another person toileting, which includes using toliet, bedpan, or urinal?: A Lot Help from another person bathing (including washing, rinsing, drying)?: A Lot Help from another person to put on and taking off regular upper body clothing?: A Little Help from another person to put on and taking off regular lower body clothing?: A Lot 6 Click Score: 16   End of Session Equipment Utilized During Treatment: Oxygen Nurse Communication: Mobility status  Activity Tolerance: Patient tolerated treatment well Patient left: in  chair;with call bell/phone within reach;with chair alarm set  OT Visit Diagnosis: Unsteadiness on feet (R26.81);Muscle weakness (generalized) (M62.81);Other symptoms and signs involving cognitive function                Time: 1010-1045 OT Time Calculation (min): 35 min Charges:  OT General Charges $OT Visit: 1 Visit OT Evaluation $OT Eval Moderate Complexity: 1 Mod OT Treatments $Self Care/Home Management : 8-22 mins  Maurie Boettcher, OT/L   Acute OT Clinical Specialist Acute Rehabilitation Services Pager [phone] Office [phone]   [facility] 07/14/2018, 10:54 AM

## 2018-07-14 NOTE — Consult Note (Signed)
   [facility] CM Inpatient Consult   07/14/2018  [name] November 01, 1945 [id number]    Received call from [place] [facility] office indicating patient's roommate, [name] wanted writer to come back to room to discuss [place] [facility] services.  Went back to bedside. Spoke with [name] (friend/ roommate/ POA) who indicates he should be contacted for all patient correspondence. States patient forgets things. [name] wants [place] [facility] follow up and Ms. Cloninger is agreeable as well. She is also agreeable to [name] being called post hospital discharge.  [name] (friend/roommate/POA) contact number is [phone].  Will make [place] [facility] referral once disposition plans known.   Marthenia Rolling, MSN-Ed, RN,BSN [facility] Liaison [phone]

## 2018-07-14 NOTE — Progress Notes (Signed)
PROGRESS NOTE  [name] DQQ:[id number] DOB: 04/23/1946 DOA: 07/08/2018 PCP: Diane Frizzle, MD  HPI/Recap of past 24 hours:  Denies headache, no fever, no n/v Reports poor [name]  1.7liter urine output documented, urine is clear  Assessment/Plan: Principal Problem:   Seizure ([place]) Active Problems:   CAD (coronary artery disease) CABG x5 2011   Type 2 diabetes mellitus with chronic kidney disease (HCC)   AF (paroxysmal atrial fibrillation) ([place])   Hypoglycemia due to insulin   Leukocytosis   Brain metastases (HCC)   Lung cancer (HCC)   Seizures ([place])   Small cell lung cancer in adult [facility])   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  Found to have left cerebral masses on CT imaging. -She is started on Decadron, Keppra, radiation therapy -No more seizures sugar activity since in the hospital -Radiation oncology and medical oncology aware of patient hospitalization, patient request to talk to medical oncologist Diane. Julien [name],   Hyperkalemia: k peaked at 5.5 on 12/6 k today is 5  AKI on CKDII: -cr 1.28 on presentation -improved, cr 0.9 today   Insulin dependent DM2 a1c in 03/2018 was 6.1 Had hypoglycemia episodes during this hospitalization  Currently on decadron, reduced does of insulin Am blood glucose 102 Continue monitor  H/o ?PAF: Interrogation of her loop recorder has shown recurrent episodes of brief atrial tachycardia and long periods of PACs and patterns of bigeminy or trigeminy. There is no convincing evidence of atrial fibrillation. Currently sinus rhythm, she is continued on asa/plavix per cardiology recommendation in the past  D/c tele  Diastolic chf: Currently euvolemic, continue home does lasix  coronary disease status post bypass surgery in 2011, denies chest pain  H/o peripheral arterial disease with bilateral femoral artery occlusions  Morbid  obesity/OSA/chronic hypoxia on home o2,  she reports has been sleeping in recliner for years Body mass index is 41.7 kg/m.   FTT:  Will benefit from [facility] placement   Code Status: DNR  Family Communication: patient , HPOA [name] at bedside  Disposition Plan: SNF once bed available, patient and HPOA agreed to it   Consultants:  Rad onc [name]  Med onc [name]   Palliative care  Procedures:  XRT to brain mets  Antibiotics:  none   Objective: BP (!) 157/50 (BP Location: Right Arm)   Pulse 76   Temp 99 F (37.2 C) (Oral)   Resp 20   Ht 4\' 10"  (1.473 m)   Wt 90.5 kg   SpO2 93%   BMI 41.70 kg/m   Intake/Output Summary (Last 24 hours) at 07/14/2018 1638 Last data filed at 07/14/2018 1534 Gross per 24 hour  Intake -  Output 950 ml  Net -950 ml   Filed Weights   07/08/18 2306 07/10/18 0054  Weight: 91.6 kg 90.5 kg    Exam: Patient is examined daily including today on 07/14/2018, exams remain the same as of yesterday except that has changed    General:  Frail, chronically ill appearing, obese   Cardiovascular: RRR  Respiratory: diminished at basis, no wheezing,   Abdomen: Soft/ND/NT, positive BS  Musculoskeletal: No Edema  Neuro: alert, oriented , does has poor short term [name]  Data Reviewed: Basic Metabolic Panel: Recent Labs  Lab 07/08/18 2325 07/08/18 2331 07/09/18 0536 07/09/18 1855 07/11/18 0505 07/13/18 0455 07/14/18 0511  NA 144 140 144  --  143 144 143  K  4.9 4.7 5.3* 5.5* 5.3* 5.1 5.0  CL 94* 93* 97*  --  99 102 100  CO2 39*  --  39*  --  34* 36* 34*  GLUCOSE 98 96 99  --  134* 73 102*  BUN 36* 37* 34*  --  36* 37* 40*  CREATININE 1.28* 1.20* 1.17*  --  0.90 0.87 0.90  CALCIUM 8.8*  --  8.3*  --  8.1* 8.1* 8.2*   Liver Function Tests: Recent Labs  Lab 07/08/18 2325 07/09/18 0536 07/11/18 0505  AST 19 19 17   ALT 14 15 13   ALKPHOS 53 49 46  BILITOT 0.4 0.3 0.4  PROT 6.4* 5.9* 6.2*  ALBUMIN 2.7* 2.5* 2.9*    No results for input(s): LIPASE, AMYLASE in the last 168 hours. No results for input(s): AMMONIA in the last 168 hours. CBC: Recent Labs  Lab 07/08/18 2325 07/08/18 2331 07/09/18 0420  WBC 17.0*  --  11.8*  NEUTROABS 14.8*  --   --   HGB 11.4* 12.6 8.9*  HCT 39.1 37.0 30.8*  MCV 98.0  --  99.7  PLT 173  --  140*   Cardiac Enzymes:   No results for input(s): CKTOTAL, CKMB, CKMBINDEX, TROPONINI in the last 168 hours. BNP (last 3 results) Recent Labs    06/17/18 2114 07/02/18 1143  BNP 61.2 114.2*    ProBNP (last 3 results) No results for input(s): PROBNP in the last 8760 hours.  CBG: Recent Labs  Lab 07/13/18 1619 07/13/18 2103 07/14/18 0744 07/14/18 1116 07/14/18 1558  GLUCAP 176* 98 100* 145* 136*    No results found for this or any previous visit (from the past 240 hour(s)).   Studies: No results found.  Scheduled Meds: . amLODipine  10 mg Oral Daily  . aspirin EC  81 mg Oral Daily  . clopidogrel  75 mg Oral Daily  . dexamethasone  4 mg Intravenous Q6H  . furosemide  40 mg Oral Daily  . hydrALAZINE  25 mg Oral Q6H  . insulin aspart  0-15 Units Subcutaneous TID WC  . insulin aspart  0-5 Units Subcutaneous QHS  . insulin detemir  5 Units Subcutaneous BID  . ipratropium-albuterol  3 mL Nebulization TID  . levETIRAcetam  500 mg Oral BID  . metoCLOPramide  5 mg Oral BID  . pantoprazole  40 mg Oral Daily  . polyethylene glycol  17 g Oral Daily  . rosuvastatin  10 mg Oral Daily  . sodium chloride flush  3 mL Intravenous Q12H  . sodium polystyrene  15 g Oral Once    Continuous Infusions:   Time spent: 14mins I have personally reviewed and interpreted on  07/14/2018 daily labs, tele strips, imagings as discussed above under date review session and assessment and plans.  I reviewed all nursing notes, pharmacy notes, consultant notes,  vitals, pertinent old records  I have discussed plan of care as described above with RN , patient and family on  07/14/2018   Diane Reasons MD, PhD  [facility] Pager [phone]. If 7PM-7AM, please contact night-coverage at [email or web address], password [facility] 07/14/2018, 4:38 PM  LOS: 5 days

## 2018-07-15 ENCOUNTER — Inpatient Hospital Stay (HOSPITAL_COMMUNITY): Payer: Medicare Other

## 2018-07-15 ENCOUNTER — Ambulatory Visit
Admit: 2018-07-15 | Discharge: 2018-07-15 | Disposition: A | Discharge status: Home / Self Care | Payer: Medicare Other | Attending: Radiation Oncology | Admitting: Radiation Oncology

## 2018-07-15 ENCOUNTER — Encounter: Payer: Self-pay | Admitting: Radiation Oncology

## 2018-07-15 DIAGNOSIS — C7931 Secondary malignant neoplasm of brain: Secondary | ICD-10-CM

## 2018-07-15 DIAGNOSIS — J90 Pleural effusion, not elsewhere classified: Secondary | ICD-10-CM

## 2018-07-15 DIAGNOSIS — C3490 Malignant neoplasm of unspecified part of unspecified bronchus or lung: Secondary | ICD-10-CM

## 2018-07-15 LAB — CBC WITH DIFFERENTIAL/PLATELET
Abs Immature Granulocytes: 0.16 10*3/uL — ABNORMAL HIGH (ref 0.00–0.07)
Basophils Absolute: 0 10*3/uL (ref 0.0–0.1)
Basophils Relative: 0 %
Eosinophils Absolute: 0 10*3/uL (ref 0.0–0.5)
Eosinophils Relative: 0 %
HCT: 38.2 % (ref 36.0–46.0)
Hemoglobin: 11.8 g/dL — ABNORMAL LOW (ref 12.0–15.0)
Immature Granulocytes: 1 %
Lymphocytes Relative: 4 %
Lymphs Abs: 0.5 10*3/uL — ABNORMAL LOW (ref 0.7–4.0)
MCH: 29.4 pg (ref 26.0–34.0)
MCHC: 30.9 g/dL (ref 30.0–36.0)
MCV: 95.3 fL (ref 80.0–100.0)
Monocytes Absolute: 0.5 10*3/uL (ref 0.1–1.0)
Monocytes Relative: 4 %
Neutro Abs: 11.5 10*3/uL — ABNORMAL HIGH (ref 1.7–7.7)
Neutrophils Relative %: 91 %
Platelets: 243 10*3/uL (ref 150–400)
RBC: 4.01 MIL/uL (ref 3.87–5.11)
RDW: 15 % (ref 11.5–15.5)
WBC: 12.7 10*3/uL — ABNORMAL HIGH (ref 4.0–10.5)
nRBC: 0 % (ref 0.0–0.2)

## 2018-07-15 LAB — BASIC METABOLIC PANEL
Anion gap: 9 (ref 5–15)
BUN: 45 mg/dL — AB (ref 8–23)
CO2: 35 mmol/L — ABNORMAL HIGH (ref 22–32)
Calcium: 8.5 mg/dL — ABNORMAL LOW (ref 8.9–10.3)
Chloride: 98 mmol/L (ref 98–111)
Creatinine, Ser: 0.91 mg/dL (ref 0.44–1.00)
GFR calc Af Amer: >60 mL/min (ref >60)
Glucose, Bld: 171 mg/dL — ABNORMAL HIGH (ref 70–99)
Potassium: 4.9 mmol/L (ref 3.5–5.1)
Sodium: 142 mmol/L (ref 135–145)

## 2018-07-15 LAB — GLUCOSE, CAPILLARY
GLUCOSE-CAPILLARY: 153 mg/dL — AB (ref 70–99)
Glucose-Capillary: 140 mg/dL — ABNORMAL HIGH (ref 70–99)
Glucose-Capillary: 123 mg/dL — ABNORMAL HIGH (ref 70–99)
Glucose-Capillary: 189 mg/dL — ABNORMAL HIGH (ref 70–99)

## 2018-07-15 NOTE — Evaluation (Signed)
Clinical/Bedside Swallow Evaluation Patient Details  Name: LAMYIAH CRAWSHAW MRN: [id number] Date of Birth: 05-03-1946  Today's Date: 07/15/2018 Time: SLP Start Time (ACUTE ONLY): 5 SLP Stop Time (ACUTE ONLY): 1300 SLP Time Calculation (min) (ACUTE ONLY): 30 min  Past Medical History:  Past Medical History:  Diagnosis Date  . Atrial fibrillation (HCC)    paroxysmal  . CAD (coronary artery disease)    X5  . Cancer ([place])    lung  . Chronic kidney disease   . Diabetes mellitus   . Gastritis   . GI bleeding   . Hiatal hernia   . Hyperglycemia   . Hyperlipidemia   . Hypertension   . Mediastinal adenopathy    and hilar  . Obesity   . Obstructive sleep apnea    does not wear CPAP  . Osteopenia   . Oxygen dependent   . Peripheral artery disease (HCC)    lower extremities  . Pneumonia   . Stroke ([place])   . Systolic murmur   . Ulcer   . Vitamin D deficiency   . Wears dentures    Past Surgical History:  Past Surgical History:  Procedure Laterality Date  . ANGIOPLASTY / STENTING FEMORAL Right    leg  . BREAST SURGERY     biopsy  . Carotid doppler  10/13/2011   right ICA-49% diameter reduction and left ICA with 50%-69% diameter reduction  . CATARACT EXTRACTION W/ INTRAOCULAR LENS  IMPLANT, BILATERAL    . CHOLECYSTECTOMY    . COLONOSCOPY W/ BIOPSIES AND POLYPECTOMY    . CORONARY ARTERY BYPASS GRAFT  05/22/2010  . Heart bypass    . Myoview Stress  08/13/2010   inferolateral scar without ischemia  . VIDEO BRONCHOSCOPY WITH ENDOBRONCHIAL ULTRASOUND N/A 07/07/2018   Procedure: VIDEO BRONCHOSCOPY WITH ENDOBRONCHIAL ULTRASOUND;  Surgeon: Garner Nash, DO;  Location: [facility] OR;  Service: Thoracic;  Laterality: N/A;   HPI:  RHYTHM GUBBELS is an 72 y.o. female female with past medical history of hypertension paroxysmal atrial, with a history of recently diagnosed small cell lung cancer fibrillation COPD on oxygen diabetes mellitus type 2 chronic kidney disease who presents after  witnessed seizure. MRI demonstrates frontoparietal and cerebellar lesions. Patient has no known history of dysphagia. She is currently reporting difficulty coughing up her own secretions.    Assessment / Plan / Recommendation Clinical Impression  Patient's POA's present at bedside. Patient reports she doesn't have swalllowing problems, but can't cough up secretions/mucous in her throat. Oral motor exam was unremarkable. No cough/throat clear or wet vocal quality noticed with any bolus consistency or size. At the end of evaluation patient was able to cough up mucous and expectorate orally. Her spontaneous cough was then normal and no longer congested. Patient's swallow appears normal with a strong cough. Her mucous is very thick and therefore difficult to clear. Recommend continuing a regular diet with thin liquids. Patient self limits her choices already to soft foods, soups and supplemental drinks. Reviewed aspiration precautions, sit up straight while eating, small sips and bites and slow rate. Recommend patient to be followed by speech therapy at next venue to ensure continued tolerance of diet, review aspiration precautions. No further speech therapy recommended at this level of care.  SLP Visit Diagnosis: Dysphagia, unspecified (R13.10)    Aspiration Risk  No limitations    Diet Recommendation Regular;Thin liquid   Liquid Administration via: Cup Medication Administration: Whole meds with liquid Supervision: Patient able to self feed Compensations: Slow rate;Small  sips/bites Postural Changes: Seated upright at 90 degrees;Remain upright for at least 30 minutes after po intake    Other  Recommendations Oral Care Recommendations: Oral care QID   Follow up Recommendations Skilled Nursing facility                  Prognosis Prognosis for Safe Diet Advancement: Good      Swallow Study   General Date of Onset: 07/14/18 HPI: DEBORHA MOSELEY is an 72 y.o. female female with past medical  history of hypertension paroxysmal atrial, with a history of recently diagnosed small cell lung cancer fibrillation COPD on oxygen diabetes mellitus type 2 chronic kidney disease who presents after witnessed seizure. MRI demonstrates frontoparietal and cerebellar lesions. Patient has no known history of dysphagia. She is currently reporting difficulty coughing up her own secretions.  Type of Study: Bedside Swallow Evaluation Previous Swallow Assessment: none Diet Prior to this Study: Regular;Thin liquids Temperature Spikes Noted: No Respiratory Status: Nasal cannula History of Recent Intubation: No Behavior/Cognition: Alert;Cooperative;Pleasant mood Oral Cavity Assessment: Within Functional Limits Oral Care Completed by SLP: No Oral Cavity - Dentition: Dentures, top;Dentures, bottom Vision: Functional for self-feeding Self-Feeding Abilities: Able to feed self Patient Positioning: Upright in bed Baseline Vocal Quality: Wet Volitional Cough: Strong;Congested Volitional Swallow: Able to elicit    Oral/Motor/Sensory Function Overall Oral Motor/Sensory Function: Within functional limits   Ice Chips Ice chips: Within functional limits Presentation: Spoon   Thin Liquid Thin Liquid: Within functional limits Presentation: Cup    Nectar Thick Nectar Thick Liquid: Not tested   Honey Thick Honey Thick Liquid: Not tested   Puree Puree: Within functional limits Presentation: Spoon   Solid     Solid: Within functional limits Presentation: Hardy, [place], CCC-SLP 07/15/2018 1:07 PM

## 2018-07-15 NOTE — Progress Notes (Signed)
Subjective: The patient is seen and examined today.  Her boyfriend as well as another friend who has the power of attorney for the patient has been at the bedside.  The patient was recently admitted with seizure activity secondary to metastatic brain lesions.  She is currently undergoing treatment with palliative radiotherapy to the brain under the care of Dr. Sondra Come.  The recent pathology was consistent with metastatic small cell carcinoma.  The patient is feeling fine today except for increasing fatigue and weakness as well as shortness of breath at baseline increased with exertion.  She denied having any fever or chills.  She has no nausea, vomiting, diarrhea or constipation.  Objective: Vital signs in last 24 hours: Temp:  [97.9 F (36.6 C)-99 F (37.2 C)] 98 F (36.7 C) (12/12 0435) Pulse Rate:  [74-90] 74 (12/12 0629) Resp:  [18-20] 20 (12/12 0435) BP: (157-185)/(50-89) 166/54 (12/12 0629) SpO2:  [92 %-94 %] 94 % (12/12 0718) Weight:  [195 lb 12.3 oz (88.8 kg)] 195 lb 12.3 oz (88.8 kg) (12/12 0602)  Intake/Output from previous day: 12/11 0701 - 12/12 0700 In: 360 [P.O.:360] Out: 800 [Urine:800] Intake/Output this shift: No intake/output data recorded.  General appearance: alert, cooperative, fatigued and no distress Resp: rales bilaterally and wheezes bilaterally Cardio: regular rate and rhythm, S1, S2 normal, no murmur, click, rub or gallop GI: soft, non-tender; bowel sounds normal; no masses,  no organomegaly Extremities: extremities normal, atraumatic, no cyanosis or edema  Lab Results:  Recent Labs    07/15/18 0513  WBC 12.7*  HGB 11.8*  HCT 38.2  PLT 243   BMET Recent Labs    07/14/18 0511 07/15/18 0513  NA 143 142  K 5.0 4.9  CL 100 98  CO2 34* 35*  GLUCOSE 102* 171*  BUN 40* 45*  CREATININE 0.90 0.91  CALCIUM 8.2* 8.5*    Studies/Results: Dg Chest Port 1 View  Result Date: 07/15/2018 CLINICAL DATA:  Chest tightness, shortness of Breath EXAM:  PORTABLE CHEST 1 VIEW COMPARISON:  07/09/2018 FINDINGS: Large right mid lung mass with innumerable bilateral pulmonary nodules again noted, unchanged. Small right pleural effusion. Prior CABG. Mild cardiomegaly. No acute bony abnormality. IMPRESSION: Stable large right mid lung mass and innumerable pulmonary nodules. Small right effusion. Electronically Signed   By: Rolm Baptise M.D.   On: 07/15/2018 10:37    Medications: I have reviewed the patient's current medications.  Assessment/Plan: This is a very pleasant 72 years old white female recently diagnosed with extensive stage small cell lung cancer presented with multiple bilateral pulmonary nodules in addition to mediastinal lymphadenopathy, bilateral pleural effusion as well as multiple brain metastasis diagnosed in December 2019. I had a lengthy discussion with the patient and her family about her current disease stage, prognosis and treatment options.  I explained to the patient that she has incurable condition and all the treatment option will be of palliative nature. I gave the patient the option of palliative care and hospice referral versus consideration of palliative systemic chemotherapy with carboplatin, etoposide and Tecentriq after completion of her brain radiation. I also explained to the patient her prognosis with and without treatment.  I informed the patient that even with treatment the average survival for extensive stage small cell lung cancer is around 9 months and without treatment it is usually less than 3 months. I explained to the patient also the adverse effect of the chemotherapy including but not limited to alopecia, myelosuppression, nausea and vomiting, peripheral neuropathy, liver or renal  dysfunction in addition to the adverse effect of the chemotherapy. The patient is considering treatment but the family has concern about the logistics of getting her to treatment and also support at home.  They will have more discussion  of her option and they will inform me with their final decision. For now the patient will continue with the whole brain irradiation. I will arrange for the patient a follow-up appointment with me at the cancer center after discharge for more detailed discussion of her treatment options unless she decided to proceed with hospice service. I gave the patient has a time to ask questions and answers them completely to her satisfaction.    LOS: 6 days    Eilleen Kempf 07/15/2018

## 2018-07-15 NOTE — Progress Notes (Signed)
SLP Cancellation Note  Patient Details Name: IDABELLE MCPETERS MRN: [id number] DOB: 1945/08/11   Cancelled treatment:        Patient at test/procedure. Radiation. Will attempt to see at next available time.    [name] Vinny Taranto, MA, CCC-SLP 07/15/2018 11:11 AM

## 2018-07-15 NOTE — NC FL2 (Signed)
[place] LEVEL OF CARE SCREENING TOOL     IDENTIFICATION  Patient Name: [name] Birthdate: 12/15/1945 Sex: female Admission Date (Current Location): 07/08/2018  [facility] and [place] Number:  Herbalist and Address:  [facility],  [place] [address] [place]      Provider Number: [id number]  Attending Physician Name and Address:  Florencia Reasons, MD  Relative Name and Phone Number:       Current Level of Care: Hospital Recommended Level of Care: [place] Prior Approval Number:    Date Approved/Denied:   PASRR Number: [id number]  Discharge Plan: SNF    Current Diagnoses: Patient Active Problem List   Diagnosis Date Noted  . 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 Seizure ([place]) 07/09/2018  . Hypoglycemia due to insulin 07/09/2018  . Leukocytosis 07/09/2018  . Brain metastases ([place]) 07/09/2018  . Lung cancer ([place]) 07/09/2018  . Seizures ([place]) 07/09/2018  . Mediastinal adenopathy 07/07/2018  . Pleural effusion on right 06/28/2018  . Pulmonary nodules/lesions, multiple 06/23/2018  . AF (paroxysmal atrial fibrillation) ([place]) 06/18/2018  . Diabetic gastroparesis ([place]) 06/18/2018  . Protein calorie malnutrition ([place]) 06/18/2018  . GERD (gastroesophageal reflux disease) 06/18/2018  . Acute hypoxemic respiratory failure ([place]) 06/17/2018  . Bilateral carotid artery disease ([place]) 11/30/2015  . Encounter for loop recorder at end of battery life 05/22/2014  . CKD (chronic kidney disease) stage 3, GFR 30-59 ml/min (HCC) 05/22/2014  . History of stroke 05/22/2014  . Type 2 diabetes mellitus with chronic kidney disease ([place]) 05/22/2014  . Mild aortic stenosis by prior echocardiogram 05/22/2014  . Atrial tachycardia, paroxysmal ([place]) 02/10/2013  . Hyperlipidemia   . Hypertension   . Sacral decubitus ulcer   . Diabetes mellitus   . Systolic murmur   . Hyperglycemia   . PAD (peripheral artery disease)  ([place])   . CAD (coronary artery disease) CABG x5 2011   . Obstructive sleep apnea   . Vitamin D deficiency     Orientation RESPIRATION BLADDER Height & Weight     Self, Time, Situation, Place  O2(4L) Continent, External catheter Weight: 195 lb 12.3 oz (88.8 kg) Height:  4\' 10"  (147.3 cm)  BEHAVIORAL SYMPTOMS/MOOD NEUROLOGICAL BOWEL NUTRITION STATUS      Continent Diet(heart healthy carb modified diet)  AMBULATORY STATUS COMMUNICATION OF NEEDS Skin       Normal                       Personal Care Assistance Level of Assistance              Functional Limitations Info             SPECIAL CARE FACTORS FREQUENCY  PT (By licensed PT), OT (By licensed OT)     PT Frequency: 5x OT Frequency: 5x            Contractures Contractures Info: Not present    Additional Factors Info  Code Status, Allergies Code Status Info: DNR Allergies Info: Neosporin Neomycin-polymyxin-gramicidin, Latex, Sulfa Antibiotics           Current Medications (07/15/2018):  This is the current hospital active medication list Current Facility-Administered Medications  Medication Dose Route Frequency Provider Last Rate Last Dose  . acetaminophen (TYLENOL) tablet 650 mg  650 mg Oral Q6H PRN Hosie Poisson, MD   650 mg at 07/13/18 1240   Or  . acetaminophen (TYLENOL) suppository 650 mg  650 mg Rectal Q6H PRN Hosie Poisson, MD      . albuterol (PROVENTIL) (2.5 MG/3ML) 0.083% nebulizer solution 3 mL  3 mL Inhalation Q6H PRN [name], MD   3 mL at 07/14/18 0554  . amLODipine (NORVASC) tablet 10 mg  10 mg Oral Daily [name], MD   10 mg at 07/15/18 1003  . aspirin EC tablet 81 mg  81 mg Oral Daily [name], MD   81 mg at 07/15/18 1004  . clopidogrel (PLAVIX) tablet 75 mg  75 mg Oral Daily [name], MD   75 mg at 07/15/18 1003  . dexamethasone (DECADRON) injection 4 mg  4 mg Intravenous Q6H Green, Terri L, RPH   4 mg at 07/15/18 0511  . feeding  supplement (GLUCERNA SHAKE) (GLUCERNA SHAKE) liquid 237 mL  237 mL Oral TID BM Florencia Reasons, MD   237 mL at 07/14/18 2100  . furosemide (LASIX) tablet 40 mg  40 mg Oral Daily [name], MD   40 mg at 07/15/18 1005  . hydrALAZINE (APRESOLINE) injection 5 mg  5 mg Intravenous Q6H PRN [name], MD   5 mg at 07/13/18 2345  . hydrALAZINE (APRESOLINE) tablet 25 mg  25 mg Oral Q6H [name], MD   25 mg at 07/15/18 0511  . HYDROcodone-acetaminophen (NORCO/VICODIN) 5-325 MG per tablet 1 tablet  1 tablet Oral Q6H PRN [name], MD   1 tablet at 07/12/18 1626  . insulin aspart (novoLOG) injection 0-15 Units  0-15 Units Subcutaneous TID WC Hosie Poisson, MD   3 Units at 07/15/18 0827  . insulin aspart (novoLOG) injection 0-5 Units  0-5 Units Subcutaneous QHS Hosie Poisson, MD      . insulin detemir (LEVEMIR) injection 5 Units  5 Units Subcutaneous BID [name], MD   5 Units at 07/15/18 1004  . ipratropium-albuterol (DUONEB) 0.5-2.5 (3) MG/3ML nebulizer solution 3 mL  3 mL Nebulization TID [name], MD   3 mL at 07/15/18 0717  . levETIRAcetam (KEPPRA) tablet 500 mg  500 mg Oral BID Alvy Bimler, Ni, MD   500 mg at 07/15/18 1003  . LORazepam (ATIVAN) injection 1-2 mg  1-2 mg Intravenous Q2H PRN [name], MD   2 mg at 07/11/18 2212  . metoCLOPramide (REGLAN) tablet 5 mg  5 mg Oral BID [name], MD   5 mg at 07/15/18 0827  . morphine 4 MG/ML injection 3 mg  3 mg Intravenous Q4H PRN [name], MD   3 mg at 07/10/18 1127  . ondansetron (ZOFRAN) tablet 4 mg  4 mg Oral Q6H PRN Hosie Poisson, MD       Or  . ondansetron (ZOFRAN) injection 4 mg  4 mg Intravenous Q6H PRN Hosie Poisson, MD   4 mg at 07/12/18 1856  . pantoprazole (PROTONIX) EC tablet 40 mg  40 mg Oral Daily Hosie Poisson, MD   40 mg at 07/15/18 1003  . polyethylene glycol (MIRALAX / GLYCOLAX) packet 17 g  17 g Oral Daily Hosie Poisson, MD      . rosuvastatin  (CRESTOR) tablet 10 mg  10 mg Oral Daily [name], MD   10 mg at 07/15/18 1004  . sodium chloride flush (NS) 0.9 % injection 3 mL  3 mL Intravenous Q12H Hosie Poisson, MD   3 mL at 07/14/18 2247  . sodium polystyrene (KAYEXALATE) 15 GM/60ML suspension 15 g  15 g  Oral Once [name], MD         Discharge Medications: Please see discharge summary for a list of discharge medications.  Relevant Imaging Results:  Relevant Lab Results:   Additional Information SS# [id number]. Has radiation daily at [facility] until 07/23/18  Nila Nephew, LCSW

## 2018-07-15 NOTE — Care Management Important Message (Signed)
Important Message  Patient Details  Name: RENNEE COYNE MRN: [id number] Date of Birth: [birth year]   Medicare Important Message Given:  Yes    Kerin Salen 07/15/2018, 12:52 [place] Message  Patient Details  Name: KESHAUNA DEGRAFFENREID MRN: [id number] Date of Birth: June 30, 1946   Medicare Important Message Given:  Yes    Kerin Salen 07/15/2018, 12:52 PM

## 2018-07-15 NOTE — Progress Notes (Signed)
Physical Therapy Treatment Patient Details Name: [name] MRN: [id number] DOB: 31-Jul-1946 Today's Date: 07/15/2018    History of Present Illness 72 y.o. female female with past medical history of hypertension paroxysmal atrial, with a history of recently diagnosed small cell lung cancer fibrillation COPD on oxygen diabetes mellitus type 2 chronic kidney disease who presents after witnessed seizure. MRI demonstrates frontoparietal and cerebellar lesions.    PT Comments    Progressing slowly with mobility. Pt fatigues fairly easily and she is at risk for falls when mobilizing. Continue to recommend SNF for continued therapy.    Follow Up Recommendations  SNF     Equipment Recommendations  None recommended by PT    Recommendations for Other Services       Precautions / Restrictions Precautions Precautions: Fall Precaution Comments: urgency to urinate; O2 dep Restrictions Weight Bearing Restrictions: No    Mobility  Bed Mobility Overal bed mobility: Needs Assistance Bed Mobility: Supine to Sit     Supine to sit: Min assist;HOB elevated     General bed mobility comments: Assist for trunk. Increased time.   Transfers Overall transfer level: Needs assistance Equipment used: Rolling walker (2 wheeled) Transfers: Sit to/from Stand Sit to Stand: Min assist Stand pivot transfers: Min assist       General transfer comment: Assist to rise, stabilize, control descent. Stand pivot, bed to bsc,with pt using armrests for support.   Ambulation/Gait Ambulation/Gait assistance: +2 physical assistance;+2 safety/equipment;Mod assist Gait Distance (Feet): 4 Feet Assistive device: 1 person hand held assist       General Gait Details: Assist to stabilize/support pt for those few steps from bsc to [facility]. Cues for safety. Pt is very unsteady and at risk for falls. Remained on [place] O2.    Stairs             Wheelchair Mobility    Modified Rankin (Stroke Patients  Only)       Balance Overall balance assessment: Needs assistance           Standing balance-Leahy Scale: Poor                              Cognition Arousal/Alertness: Awake/alert   Overall Cognitive Status: Impaired/Different from baseline Area of Impairment: Problem solving;Safety/judgement                         Safety/Judgement: Decreased awareness of safety   Problem Solving: Slow processing;Requires tactile cues;Requires verbal cues        Exercises      General Comments        Pertinent Vitals/Pain Pain Assessment: No/denies pain    Home Living                      Prior Function            PT Goals (current goals can now be found in the care plan section) Progress towards PT goals: Progressing toward goals    Frequency    Min 2X/week      PT Plan Current plan remains appropriate    Co-evaluation              AM-PAC PT "6 Clicks" Mobility   Outcome Measure  Help needed turning from your back to your side while in a flat bed without using bedrails?: A Lot Help needed moving from lying on your back to  sitting on the side of a flat bed without using bedrails?: A Lot Help needed moving to and from a bed to a chair (including a wheelchair)?: A Lot Help needed standing up from a chair using your arms (e.g., wheelchair or bedside chair)?: A Lot Help needed to walk in hospital room?: A Lot Help needed climbing 3-5 steps with a railing? : Total 6 Click Score: 11    End of Session Equipment Utilized During Treatment: Oxygen Activity Tolerance: Patient limited by fatigue Patient left: in bed;with call bell/phone within reach;with bed alarm set   PT Visit Diagnosis: Muscle weakness (generalized) (M62.81);Unsteadiness on feet (R26.81);Difficulty in walking, not elsewhere classified (R26.2)     Time: 4403-4742 PT Time Calculation (min) (ACUTE ONLY): 13 min  Charges:  $Therapeutic Activity: 8-22 mins                         Weston Anna, PT Acute Rehabilitation Services Pager: [phone] Office: [phone]

## 2018-07-15 NOTE — Progress Notes (Signed)
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  Found to have left cerebral masses on CT imaging. -She is started on Decadron, Keppra, radiation therapy -No more seizures sugar activity since in the hospital -Radiation oncology and medical oncology aware of patient hospitalization, patient and family requests to talk to medical oncologist Dr. Julien Nordmann  Hyperkalemia: k peaked at 5.5 on 12/6 k today is 4.9  AKI on CKDII: -cr 1.28 on presentation -improved, cr 0.9    Insulin dependent DM2 a1c in 03/2018 was 6.1 Had hypoglycemia episodes during this hospitalization  Currently on decadron, reduced does of insulin Am blood glucose 102 Continue monitor  H/o ?PAF: Interrogation of her loop recorder has shown recurrent episodes of brief atrial tachycardia and long periods of PACs and patterns of bigeminy or trigeminy. There is no convincing evidence of atrial fibrillation. Currently sinus rhythm, she is continued on asa/plavix per cardiology recommendation in the past    Diastolic chf: Currently euvolemic, continue home does lasix  coronary disease status post bypass surgery in 2011, denies chest pain  H/o peripheral arterial disease with bilateral femoral artery occlusions  Morbid  obesity/OSA/chronic hypoxia on home o2,  she reports has been sleeping in recliner for years Body mass index is 40.92 kg/m.   FTT:  Will benefit from [facility] placement   Code Status: DNR  Family Communication: patient , HPOA [name]   Disposition Plan: SNF once bed available, patient and HPOA agreed to it   Consultants:  Rad onc Dr Sondra Come  Med onc Dr Julien Nordmann   Palliative care  Procedures:  XRT to brain mets  Antibiotics:  none   Objective: BP (!) 166/54 (BP Location: Right Arm)   Pulse 74   Temp 98 F (36.7 C) (Oral)   Resp 20   Ht 4\' 10"  (1.473 m)   Wt 88.8 kg   SpO2 94%   BMI 40.92 kg/m   Intake/Output Summary (Last 24 hours) at 07/15/2018 0957 Last data filed at 07/15/2018 0500 Gross per 24 hour  Intake 360 ml  Output 800 ml  Net -440 ml   Filed Weights   07/08/18 2306 07/10/18 0054 07/15/18 0602  Weight: 91.6 kg 90.5 kg 88.8 kg    Exam: Patient is examined daily including today on 07/15/2018, exams remain the same as of yesterday except that has changed    General:  Frail, chronically ill appearing, obese   Cardiovascular: RRR  Respiratory: diminished at basis, no wheezing,   Abdomen: Soft/ND/NT, positive BS  Musculoskeletal: No Edema  Neuro: alert, oriented , does has poor short term memory  Data Reviewed: Basic Metabolic Panel: Recent Labs  Lab 07/09/18 0536 07/09/18 1855 07/11/18 0505 07/13/18 0455 07/14/18 0511 07/15/18 0513  NA 144  --  143 144 143 142  K 5.3* 5.5* 5.3* 5.1 5.0 4.9  CL 97*  --  99 102 100 98  CO2 39*  --  34* 36* 34* 35*  GLUCOSE 99  --  134* 73 102* 171*  BUN 34*  --  36* 37* 40* 45*  CREATININE 1.17*  --  0.90 0.87 0.90 0.91  CALCIUM 8.3*  --  8.1* 8.1* 8.2* 8.5*   Liver Function Tests: Recent Labs  Lab 07/08/18 2325 07/09/18 0536 07/11/18 0505  AST 19 19 17   ALT 14 15 13   ALKPHOS 53 49 46  BILITOT 0.4 0.3 0.4  PROT 6.4* 5.9* 6.2*  ALBUMIN 2.7* 2.5* 2.9*   No results for input(s): LIPASE,  AMYLASE in the last 168 hours. No results for input(s): AMMONIA in the last 168 hours. CBC: Recent Labs  Lab 07/08/18 2325 07/08/18 2331 07/09/18 0420 07/15/18 0513  WBC 17.0*  --  11.8* 12.7*  NEUTROABS 14.8*  --   --  11.5*  HGB 11.4* 12.6 8.9* 11.8*  HCT 39.1 37.0 30.8* 38.2  MCV 98.0  --  99.7 95.3  PLT 173  --  140* 243   Cardiac Enzymes:   No results for input(s): CKTOTAL, CKMB, CKMBINDEX, TROPONINI in the last 168 hours. BNP (last 3 results) Recent Labs    06/17/18 2114 07/02/18 1143  BNP 61.2 114.2*    ProBNP (last 3 results) No results for input(s): PROBNP in the last 8760 hours.  CBG: Recent Labs  Lab 07/14/18 0744 07/14/18 1116 07/14/18 1558 07/14/18 2110 07/15/18 0747  GLUCAP 100* 145* 136* 178* 153*    No results found for this or any previous visit (from the past 240 hour(s)).   Studies: No results found.  Scheduled Meds: . amLODipine  10 mg Oral Daily  . aspirin EC  81 mg Oral Daily  . clopidogrel  75 mg Oral Daily  . dexamethasone  4 mg Intravenous Q6H  . feeding supplement (GLUCERNA SHAKE)  237 mL Oral TID BM  . furosemide  40 mg Oral Daily  . hydrALAZINE  25 mg Oral Q6H  . insulin aspart  0-15 Units Subcutaneous TID WC  . insulin aspart  0-5 Units Subcutaneous QHS  . insulin detemir  5 Units Subcutaneous BID  . ipratropium-albuterol  3 mL Nebulization TID  . levETIRAcetam  500 mg Oral BID  . metoCLOPramide  5 mg Oral BID  . pantoprazole  40 mg Oral Daily  . polyethylene glycol  17 g Oral Daily  . rosuvastatin  10 mg Oral Daily  . sodium chloride flush  3 mL Intravenous Q12H  . sodium polystyrene  15 g Oral Once    Continuous Infusions:   Time spent: 57mins, had a family meeting per family request in the presence of case manager and nurse, I have addressed their concerns and questions to the best of my ability, family request to talk to radiation oncology and medical oncology I have personally reviewed and interpreted on   07/15/2018 daily labs, imagings as discussed above under date review session and assessment and plans.  I reviewed all nursing notes, pharmacy notes, consultant notes,  vitals, pertinent old records  I have discussed plan of care as described above with RN , patient and family on 07/15/2018   Florencia Reasons MD, PhD  [facility] Pager [phone]. If 7PM-7AM, please contact night-coverage at [email or web address], password [facility] 07/15/2018, 9:57 AM  LOS: 6 days

## 2018-07-15 NOTE — Progress Notes (Signed)
  Radiation Oncology         [phone] ________________________________  Name: [name] MRN: [id number]  Date: 07/15/2018  DOB: [birth year]  Weekly Radiation Therapy Management - Inpatient    ICD-10-CM   1. 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 Brain metastases ([place]) C79.31      Current Dose: 12 Gy     Planned Dose:  30 Gy directed at the whole brain  Narrative . . . . . . . . The patient presents for routine under treatment assessment. She continues to be admitted for management of her situation but is scheduled for discharge to nursing home later today. She is alert and quite pleasant. According to healthcare POA and close friend, [name] the patient is becoming more memory issues since being admitted.                                   The patient is without complaint. She denies any headaches or nausea                                 Set-up films were reviewed.                                 The chart was checked. Physical Findings. . .   No significant changes. Quite pleasant responds appropriately to questions. She is lying on her hospital bed Impression . . . . . . . The patient is tolerating radiation. Plan . . . . . . . . . . . . Continue treatment as planned. Reviewed the patient's head CT scan, brain MRI and PET scan images with patient's close friend [name]- healthcare power of attorney. He will discuss further issues concerning chemotherapy and whether to proceed with this therapy given the patient's overall performance status at a later date with Dr Julien Nordmann.  ________________________________   Blair Promise, PhD, MD

## 2018-07-16 ENCOUNTER — Ambulatory Visit: Payer: Medicare Other

## 2018-07-16 DIAGNOSIS — Z515 Encounter for palliative care: Secondary | ICD-10-CM

## 2018-07-16 LAB — GLUCOSE, CAPILLARY
Glucose-Capillary: 114 mg/dL — ABNORMAL HIGH (ref 70–99)
Glucose-Capillary: 128 mg/dL — ABNORMAL HIGH (ref 70–99)
Glucose-Capillary: 118 mg/dL — ABNORMAL HIGH (ref 70–99)
Glucose-Capillary: 162 mg/dL — ABNORMAL HIGH (ref 70–99)

## 2018-07-16 MED ORDER — LEVETIRACETAM 500 MG PO TABS: 500.0000 mg | ORAL_TABLET | Freq: Two times a day (BID) | ORAL | 0 refills | Status: AC

## 2018-07-16 MED ORDER — DEXAMETHASONE 4 MG PO TABS: 4.0000 mg | ORAL_TABLET | Freq: Four times a day (QID) | ORAL | 0 refills | Status: AC

## 2018-07-16 NOTE — Progress Notes (Signed)
[facility])    Notified by [place] Case Manager of family request for [facility] services at home after discharge.  Chart and patient information under review by [facility] physician at this time and eligibility is pending.    Spoke with Ginnie Smart and patient, confirmed interest, answered questions, provided emotional support, and information regarding hospice services and philosophy.      Plan is to discharge later today via PTAR.     Please send signed and completed DNR home with the patient.   Patient will need prescriptions for discharge comfort medications (if necessary).   DME needs discussed, patient currently has the following equipment in the home:   Bedside commode, O2 through [facility] and walker   DME that will be needed: Suction, possibly replace oxygen at home, and nebulizer machine   [facility] Referral Center is aware of the above information.  Completed discharge summary should be faxed to [phone], once eligibility confirmed, and completed.     Please notify [facility] at [phone] (if after 5 call [phone]) when the patient is ready to leave the unit.    [facility] information given to [place]   Above information shared with Myraette and RN Olean Ree   Thank you for this referral, Venia Carbon BSN, RN [facility] Liaison (listed in [place]) [phone]??

## 2018-07-16 NOTE — Care Management Note (Signed)
Case Management Note  Patient Details  Name: [name] MRN: [id number] Date of Birth: May 12, 1946  Subjective/Objective:                    Action/Plan: Spoke with pt and [name] at bedside concerning Hospice.  Pt states that she wants [facility].   Expected Discharge Date:  07/16/18               Expected Discharge Plan:  Home w Hospice Care  In-House Referral:     Discharge planning Services  CM Consult  Post Acute Care Choice:    Choice offered to:  Patient  DME Arranged:    DME Agency:     HH Arranged:  RN [place] Agency:  [facility]  Status of Service:  Completed, signed off  If discussed at [employer] of Stay Meetings, dates discussed:    Additional CommentsPurcell Mouton, RN 07/16/2018, 10:51 AM

## 2018-07-16 NOTE — Progress Notes (Signed)
AVS reviewed with Panther Burn, POA.  Verbalized understanding of discharge instructions, Hospice follow-up, medications.  Patient's IV removed.  Site WNL.  Patient stable awaiting PTAR for transport to home.  POA verbalizes that all equipment has been delivered to home this evening.  RN called [facility] notified of patient being discharged after 1900 this evening and family requesting breathing treatment.  Dr. Erlinda Hong notified and stated to call Hospice regarding breathing treatments.

## 2018-07-16 NOTE — Consult Note (Signed)
   [facility] CM Inpatient Consult   07/16/2018  [name] 1945/10/26 [id number]    [facility] Care Management follow up.  Chart reviewed. Noted discharge plan is now for home with hospice services thru [facility].   No identifiable [facility] Care Management needs at this time.    Marthenia Rolling, MSN-Ed, RN,BSN [facility] Liaison [phone]

## 2018-07-16 NOTE — Progress Notes (Signed)
RN called POA and left voice mail to call back.  [facility] to deliver equipment tonight prior to transport.

## 2018-07-16 NOTE — Progress Notes (Signed)
Chaplain providing follow up support while rounding on unit.     Support around pt desire to discontinue radiation treatments and pursue palliative with hospice at home.  Provided empathic listening and pastoral presence, engaged in life review and reflected with pt around ways this decision is consistent with her identity.  Pt is well-supported by friends and family in this decision.  Expressed feeling "relief" and "peace." Reflected with chaplain about what "healing" means to her now.  Shared prayers with chaplain.  Pt friends, [name] and [place], at bedside.    Jerene Pitch, MDiv, [facility]

## 2018-07-16 NOTE — Progress Notes (Signed)
Patient and POA called RN to the room.  Patient reports that she wants to stop the radiation treatment.  She and POA have discussed.  She states that she wants to go home at discharge with Hospice care at home.  POA states he will arrange 24 hour care.  Possibly look at [facility] home placement in January 2020.

## 2018-07-16 NOTE — Progress Notes (Signed)
Have been working with pt and family on dc plan- had been considering SNF however today pt reports she has decided not to pursue any further cancer treatment- has been referred to hospice and planning to go home with support.  Sharren Bridge, MSW, LCSW Clinical Social Work 07/16/2018 [phone]

## 2018-07-16 NOTE — Discharge Summary (Signed)
Discharge Summary  [name] CBJ:[id number] DOB: 01-23-1946  PCP: Susy Frizzle, MD  Admit date: 07/08/2018 Discharge date: 07/16/2018  Time spent: 31mins, more than 50% time spent on coordination of care.  Recommendations for Outpatient Follow-up:  1. Discharge to home with home hospice   Discharge Diagnoses:  Active Hospital Problems   Diagnosis Date Noted  . Seizure ([place]) 07/09/2018  . 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 Hypoglycemia due to insulin 07/09/2018  . Leukocytosis 07/09/2018  . Brain metastases ([place]) 07/09/2018  . Lung cancer ([place]) 07/09/2018  . Seizures ([place]) 07/09/2018  . AF (paroxysmal atrial fibrillation) ([place]) 06/18/2018  . Type 2 diabetes mellitus with chronic kidney disease ([place]) 05/22/2014  . CAD (coronary artery disease) CABG x5 2011     Resolved Hospital Problems  No resolved problems to display.    Discharge Condition: stable  Diet recommendation: heart healthy/carb modified  Filed Weights   07/10/18 0054 07/15/18 0602 07/16/18 0343  Weight: 90.5 kg 88.8 kg 88.7 kg    History of present illness: (per admitting MD Dr Tamala Julian) HPI: MAHUM BETTEN is a 72 y.o. female with medical history significant of hypertension, hyperlipidemia, paroxysmal atrial fibrillation, COPD oxygen dependent, coronary artery disease, diabetes mellitus type 2, and chronic kidney disease; who presents after having a witnessed seizure at home. Her significant other was present at the time and reports seeing generalized tonic-clonic activity which lasted approximately 10 minutes. Thereafter patient was reportedly disoriented with slurred speech. Patient had just recently been diagnosed with lung cancer last month after CT imaging. She undergone video bronchoscopy 2 days prior and was being set up to follow-up with oncology. Patient had never had seizures previously.  ED Course: Upon admission patient noted to have stable vital signs. Labs revealed WBC 17,  hemoglobin 11.4, CO2 39, BUN 36, creatinine 1.28. Neurology was consulted.  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 Found to have left cerebral masses on CT imaging. -She is started on Decadron, Keppra, radiation therapy -No more seizures sugar activity since in the hospital -Radiation oncology and medical oncology aware of patient hospitalization, patient and family requests to talk to medical oncologist Dr. Julien Nordmann -After talking to oncology and radiation, oncology, patient decided to go home on home hospice and forego further cancer directed treatment.  Hyperkalemia: k peaked at 5.5 on 12/6 K normalized Discontinue potassium supplement  AKI on CKDII: -cr 1.28 on presentation -Creatinine normalized, cr 0.9    Insulin dependent DM2 a1c in 03/2018 was 6.1 Had hypoglycemia episodes during this hospitalization  Currently on decadron, reduced does of insulin Am blood glucose 102 Continue monitor Patient decided to go home on home hospice, she has insulin at home  H/o ?PAF: Interrogation of her loop recorder has shown recurrent episodes of brief atrial tachycardia and long periods of PACs and patterns of bigeminy or trigeminy. There is no convincing evidence  of atrial fibrillation.  sinus rhythm in the hospital, has been on asa/plavix per cardiology recommendation in the past -sHe decided to stop taking aspirin and Plavix, she  is going to go home on home hospice    Diastolic chf: Currently euvolemic, continue home does lasix on prn basis, she does not want to take Lasix every day,  Report has plenty of supply of Lasix at home  coronary disease status post bypass surgery in 2011, denies chest pain  H/o peripheral arterial disease with bilateral femoral artery occlusions  Morbid obesity/OSA/chronic hypoxia on home o2,  she reports has been sleeping in recliner for years Body mass index is 40.92 kg/m.   FTT:  Initially decision to go to skilled nursing facility, patient now  decided to go home on home hospice. Case manager and hospice input appreciated   Code Status: DNR  Family Communication: patient , HPOA [name]   Disposition Plan:  Home with home hospice   Consultants:  Rad onc Dr Sondra Come  Med onc Dr Julien Nordmann   Palliative care  Procedures:  XRT to brain mets  Antibiotics:  none    Discharge Exam: BP (!) 172/44 (BP Location: Right Arm)   Pulse 87   Temp 98.5 F (36.9 C) (Oral)   Resp 18   Ht 4\' 10"  (1.473 m)   Wt 88.7 kg   SpO2 91%   BMI 40.87 kg/m     General:  Frail, chronically ill appearing, obese   Cardiovascular: RRR  Respiratory: diminished at basis, no wheezing,   Abdomen: Soft/ND/NT, positive BS  Musculoskeletal: No Edema  Neuro: alert, oriented , does has poor short term memory   Discharge Instructions You were cared for by a hospitalist during your hospital stay. If you have any questions about your discharge medications or the care you received while you were in the hospital after you are discharged, you can call the unit and asked to speak with the hospitalist on call if the hospitalist that took care of you is not available. Once you are discharged, your primary care physician will handle any further medical issues. Please note that NO REFILLS for any discharge medications will be authorized once you are discharged, as it is imperative  that you return to your primary care physician (or establish a relationship with a primary care physician if you do not have one) for your aftercare needs so that they can reassess your need for medications and monitor your lab values.  Discharge Instructions    Diet - low sodium heart healthy   Complete by:  As directed    Carb modified diet   Diet general   Complete by:  As directed    Increase activity slowly   Complete by:  As directed    Increase activity slowly   Complete by:  As directed      Allergies as of 07/16/2018      Reactions   Neosporin [neomycin-polymyxin-gramicidin] Hives, Swelling, Rash   Latex Rash   Sulfa Antibiotics Nausea And Vomiting      Medication List    STOP taking these medications   aspirin 81 MG tablet   clopidogrel 75 MG tablet Commonly known as:  PLAVIX   [employer] VITAMIN D 50 MCG (2000 UT) Caps Generic drug:  Cholecalciferol   furosemide 40 MG tablet Commonly known as:  LASIX   niacin 750 MG CR tablet Commonly known as:  NIASPAN   phenylephrine 10 MG Tabs tablet Commonly known as:  SUDAFED  PE   potassium chloride SA 20 MEQ tablet Commonly known as:  K-DUR,KLOR-CON   rosuvastatin 10 MG tablet Commonly known as:  CRESTOR   silver sulfADIAZINE 1 % cream Commonly known as:  SILVADENE   TRADJENTA 5 MG Tabs tablet Generic drug:  linagliptin     TAKE these medications   acetaminophen 325 MG tablet Commonly known as:  TYLENOL Take 650 mg by mouth every 6 (six) hours as needed for mild pain or headache.   albuterol 108 (90 Base) MCG/ACT inhaler Commonly known as:  PROVENTIL HFA;VENTOLIN HFA Inhale 2 puffs into the lungs every 6 (six) hours as needed for wheezing or shortness of breath.   ALPRAZolam 1 MG tablet Commonly known as:  XANAX Take 0.5 tablets (0.5 mg total) by mouth 3 (three) times daily as needed for anxiety.   amLODipine 10 MG tablet Commonly known as:  NORVASC Take 10 mg by mouth daily.   B-D ULTRAFINE III SHORT  PEN 31G X 8 MM Misc Generic drug:  Insulin Pen Needle USE AS DIRECTED   dexamethasone 4 MG tablet Commonly known as:  DECADRON Take 1 tablet (4 mg total) by mouth 4 (four) times daily.   hydrALAZINE 25 MG tablet Commonly known as:  APRESOLINE Take 1 tablet (25 mg total) by mouth daily as needed. What changed:    when to take this  reasons to take this   HYDROcodone-acetaminophen 5-325 MG tablet Commonly known as:  NORCO Take 1 tablet by mouth every 6 (six) hours as needed for moderate pain.   LEVEMIR FLEXTOUCH 100 UNIT/ML Pen Generic drug:  Insulin Detemir INJECT 80 UNITS INTO THE SKIN 2 TIMES DAILY. What changed:  See the new instructions.   levETIRAcetam 500 MG tablet Commonly known as:  KEPPRA Take 1 tablet (500 mg total) by mouth 2 (two) times daily.   lisinopril 20 MG tablet Commonly known as:  PRINIVIL,ZESTRIL TAKE 1 TABLET (20 MG TOTAL) BY MOUTH DAILY.   metoCLOPramide 5 MG tablet Commonly known as:  REGLAN TAKE 1 TABLET BY MOUTH 3 TIMES A DAY BEFORE MEALS AND 1 AT BEDTIME What changed:    how much to take  how to take this  when to take this  additional instructions   pantoprazole 40 MG tablet Commonly known as:  PROTONIX Take 1 tablet (40 mg total) by mouth daily.   promethazine 25 MG tablet Commonly known as:  PHENERGAN Take 1 tablet (25 mg total) by mouth every 8 (eight) hours as needed for nausea or vomiting.      Allergies  Allergen Reactions  . Neosporin [Neomycin-Polymyxin-Gramicidin] Hives, Swelling and Rash  . Latex Rash  . Sulfa Antibiotics Nausea And Vomiting      The results of significant diagnostics from this hospitalization (including imaging, microbiology, ancillary and laboratory) are listed below for reference.    Significant Diagnostic Studies: Dg Chest 1 View  Result Date: 06/24/2018 CLINICAL DATA:  Status post right thoracentesis. EXAM: CHEST  1 VIEW COMPARISON:  Chest x-ray dated June 17, 2018. FINDINGS: Stable  cardiomediastinal silhouette status post CABG. Innumerable bilateral pulmonary nodules are unchanged. No significant residual right pleural effusion. Unchanged trace left pleural effusion. No pneumothorax. No acute osseous abnormality. IMPRESSION: 1. No residual right pleural effusion status post thoracentesis. No pneumothorax. 2. Unchanged diffuse pulmonary metastatic disease. Electronically Signed   By: Titus Dubin M.D.   On: 06/24/2018 15:16   Dg Chest 2 View  Result Date: 07/02/2018 CLINICAL DATA:  Shortness of breath. EXAM: CHEST -  2 VIEW COMPARISON:  06/28/2018. FINDINGS: Monitoring device noted over the chest. Prior CABG. Heart size stable. Multiple bilateral pulmonary mass lesions consistent metastatic disease again noted. Stable small bilateral pleural effusions. No pneumothorax. IMPRESSION: Multiple bilateral pulmonary mass lesions consistent metastatic disease again noted. Stable small bilateral pleural effusions. Electronically Signed   By: Marcello Moores  Register   On: 07/02/2018 12:11   Dg Chest 2 View  Result Date: 06/28/2018 CLINICAL DATA:  Status post right-sided thoracentesis on June 24, 2018. EXAM: CHEST - 2 VIEW COMPARISON:  Portable chest x-ray of June 24, 2018 FINDINGS: A small amount of pleural fluid has reaccumulated on the right. There is a trace of pleural fluid on the left as well. Multiple widespread pulmonary parenchymal masses of varying sizes are present. The heart and pulmonary vascularity are normal. The patient has undergone previous CABG. There is calcification in the wall of the aortic arch. The sternal wires are intact. A presumed cardiac loop recorder is located over the left cardiac apex. IMPRESSION: Interval reaccumulation of a small amount of pleural fluid on the right. A smaller left pleural effusion is present but stable. Multiple widespread pulmonary metastatic nodules. Electronically Signed   By: David  Martinique M.D.   On: 06/28/2018 15:07   Ct Head Wo  Contrast  Result Date: 07/08/2018 CLINICAL DATA:  72 year old female with seizure like activity and encephalopathy. History of lung cancer. EXAM: CT HEAD WITHOUT CONTRAST TECHNIQUE: Contiguous axial images were obtained from the base of the skull through the vertex without intravenous contrast. COMPARISON:  Head CT dated 01/19/2010 FINDINGS: Brain: There is a 12 x 13 mm high attenuating mass in the left frontal lobe with surrounding edema. An additional 12 x 12 mm high attenuating mass in the left posterior frontal convexity along the falx noted with mild surrounding edema. There is a large area of encephalomalacia in the right frontoparietal region. There is otherwise mild age-related atrophy and chronic microvascular ischemic changes. There is no acute intracranial hemorrhage. No midline shift. Vascular: No hyperdense vessel or unexpected calcification. Skull: Normal. Negative for fracture or focal lesion. Sinuses/Orbits: No acute finding. Other: None IMPRESSION: 1. No acute intracranial hemorrhage. 2. Left cerebral masses as described most consistent with metastatic disease. Further evaluation with MRI recommended. 3. Large area of encephalomalacia in the right frontoparietal region. Electronically Signed   By: Anner Crete M.D.   On: 07/08/2018 23:58   Ct Chest W Contrast  Result Date: 06/18/2018 CLINICAL DATA:  Changes consistent with pulmonary metastatic disease on recent chest x-ray EXAM: CT CHEST WITH CONTRAST TECHNIQUE: Multidetector CT imaging of the chest was performed during intravenous contrast administration. CONTRAST:  57mL OMNIPAQUE IOHEXOL 300 MG/ML  SOLN COMPARISON:  Chest x-ray from the previous day FINDINGS: Cardiovascular: Thoracic aorta demonstrates atherosclerotic calcifications without aneurysmal dilatation or dissection. Changes of coronary bypass grafting are noted. No cardiac enlargement is seen. Coronary calcifications are seen. The pulmonary artery demonstrates a normal  branching pattern without central pulmonary emboli. Loop recorder is noted in the left chest wall anteriorly. Mediastinum/Nodes: Thoracic inlet is within normal limits. Scattered lymph nodes are noted throughout the mediastinum. The largest of these lies in the subcarinal region measuring 19 mm in short axis. Hilar adenopathy is noted bilaterally most prominent in the right infrahilar region measuring 18 mm in short axis. This likely represents a conglomeration of multiple smaller nodes. In the right suprahilar region and radiating into the upper lobe on the right there is a ovoid soft tissue mass identified which measures  4.6 by 2.6 cm in greatest dimension. Some narrowing of the right upper lobe bronchus is noted although it appears patent. The esophagus as visualized is within normal limits. Lungs/Pleura: A large right-sided pleural effusion is noted. Multiple, too numerous to count pulmonary nodules are identified consistent with metastatic disease. The largest of these lies in the right upper lobe along the major fissure measuring 3.6 by 2.5 cm in greatest dimension. The large central right suprahilar mass lesion suggests a primary etiology with diffuse pulmonary metastatic disease. Bronchoscopic evaluation with biopsy is recommended. Upper Abdomen: Visualized upper abdomen demonstrates the adrenal glands to be within normal limits. Nonobstructing left renal stone is noted which measures 6 mm in greatest dimension. Renal vascular calcifications are noted as well. The remainder of the upper abdomen is unremarkable. Musculoskeletal: No acute bony abnormality is noted. Degenerative changes of the thoracic spine are seen. 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 Right-sided pleural effusion is noted as well. Referral for multidisciplinary workup is recommended likely to  include PET-CT and tissue sampling. Nonobstructing left renal stone. Aortic Atherosclerosis (ICD10-I70.0). Electronically Signed   By: Inez Catalina M.D.   On: 06/18/2018 09:58   Ct Angio Chest Pe W And/or Wo Contrast  Result Date: 07/02/2018 CLINICAL DATA:  Worsening shortness of breath since thoracentesis 1 week ago. EXAM: CT ANGIOGRAPHY CHEST WITH CONTRAST TECHNIQUE: Multidetector CT imaging of the chest was performed using the standard protocol during bolus administration of intravenous contrast. Multiplanar CT image reconstructions and MIPs were obtained to evaluate the vascular anatomy. CONTRAST:  70 mL ISOVUE-370 IOPAMIDOL (ISOVUE-370) INJECTION 76% COMPARISON:  06/18/2018 FINDINGS: Cardiovascular: Heart is normal size. Mild calcified plaque over the thoracic aorta. Median sternotomy wires are present. There is calcified plaque over the coronary arteries. Pulmonary arterial system is well opacified. No evidence of pulmonary emboli. Mediastinum/Nodes: 1.6 cm AP window lymph node and 1 cm prevascular lymph node without significant change. Subcarinal adenopathy without significant change. Mild right hilar adenopathy without significant change. Lungs/Pleura: Lungs are adequately inflated demonstrate no change in a small to moderate size right pleural effusion with new small to moderate size left pleural effusion with associated bibasilar atelectasis. No significant change in a 4.0 x 4.1 cm mass over the medial right upper lobe abutting the mediastinum. No change in numerous round bilateral pulmonary nodules compatible with metastatic disease. No significant change in 3.7 cm mass over the posterior right middle lobe. Upper Abdomen: Previous cholecystectomy. Somewhat nodular contour to the liver without focal mass. Few small bilateral renal calcifications likely stones. No significant adenopathy or free fluid. Musculoskeletal: Degenerative change of the spine. Review of the MIP images confirms the above  findings. IMPRESSION: Stable changes including a 4.0 x 4.1 cm medial right upper lobe lung mass abutting the mediastinum, numerous pulmonary nodules/masses without significant change compatible with metastatic disease, small to moderate size right pleural effusion with associated basilar atelectasis and mediastinal/hilar adenopathy. New small to moderate size left pleural effusion with basilar atelectasis. No evidence of pulmonary embolism. Aortic Atherosclerosis (ICD10-I70.0). Atherosclerotic coronary artery disease. Nephrolithiasis. Mild nodular contour of the liver which may be due to cirrhosis. Electronically Signed   By: Marin Olp M.D.   On: 07/02/2018 15:34   Mr Jeri Cos KG Contrast  Result Date: 07/12/2018 CLINICAL DATA:  Seizure.  History of lung cancer. EXAM: MRI HEAD WITHOUT AND WITH CONTRAST TECHNIQUE: Multiplanar, multiecho pulse sequences of the brain and surrounding  structures were obtained without and with intravenous contrast. 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 Brain does not show a pattern of widespread small-vessel disease elsewhere. No hydrocephalus. No extra-axial collection. Incidental large venous angioma in the left cerebellum. Multiple intracranial metastases. -4 mm metastasis at the infra medial left cerebellum axial image 31. -6 mm metastasis within the left lateral cerebellum image 46. -5 mm of tasks cysts within the left temporal lobe image 63. -5 mm metastasis within the a left forceps major white matter image 96. -14 x 13 x 11 mm task assist in the left parietal lobe axial image 113. Mild associated vasogenic edema. Mildly hemorrhagic nature. -Punctate metastasis in the medial left parietal lobe axial image 119. -22 x 16 x 15 mm metastasis or 2 adjacent metastases at the left frontoparietal junction vertex axial  image 125. Moderate associated vasogenic edema. Mildly hemorrhagic nature. Vascular: Major vessels at the base of the brain show flow. Skull and upper cervical spine: Negative Sinuses/Orbits: Clear/normal Other: None IMPRESSION: Total of 7 cerebral and cerebellar metastases identified as outlined above. The largest is at the left frontoparietal vertex measuring 22 x 16 x 15 mm with moderate associated vasogenic edema. The 2 larger lesions show a mildly hemorrhagic nature. Electronically Signed   By: Nelson Chimes M.D.   On: 07/12/2018 07:54   Nm Pet Image Initial (pi) Skull Base To Thigh  Result Date: 07/07/2018 CLINICAL DATA:  Initial treatment strategy for lung cancer. EXAM: NUCLEAR MEDICINE PET SKULL BASE TO THIGH TECHNIQUE: 10.1 mCi F-18 FDG was injected intravenously. Full-ring PET imaging was performed from the skull base to thigh after the radiotracer. CT data was obtained and used for attenuation correction and anatomic localization. Fasting blood glucose: 97 mg/dl COMPARISON:  07/02/2018 and 06/18/2018. FINDINGS: Mediastinal blood pool activity: SUV max 2.4 NECK: 9 mm nodule in the posterior left parotid (CT image 22) has an SUV max of 5.8. Likely physiologic activity in the right vocal cords. No hypermetabolic lymph nodes. Incidental CT findings: Encephalomalacia in the right temporal lobe is partially imaged. CHEST: Hypermetabolic mediastinal lymph nodes are seen with an index low right paratracheal lymph node measuring 10 mm (CT image 48) with an SUV max of 7.7. Medial right upper lobe mass is difficult to measure but has an SUV max of 9.0. There are multiple hypermetabolic nodules bilaterally, seen in a hematogenous distribution. Incidental CT findings: Atherosclerotic calcification of the aorta. No pericardial effusion. Moderate bilateral pleural effusions. ABDOMEN/PELVIS: No abnormal hypermetabolism in the liver, adrenal glands, spleen or pancreas. No hypermetabolic lymph nodes. There are complex  cystic and solid masses in the pelvis bilaterally, which presumably arise from the ovaries, measuring 7.2 cm on the right and 10.4 cm in the midline/left ovary. There is associated mild hypermetabolism with an SUV max of 2.5 on the right and 3.4 within the larger lesion in the midline. Incidental CT findings: Liver and adrenal glands are unremarkable. Stones are seen in the kidneys. Spleen, pancreas, stomach and bowel are grossly unremarkable. SKELETON: No abnormal osseous hypermetabolism. Incidental CT findings: None. IMPRESSION: 1. Hypermetabolic right upper lobe mass with hypermetabolic mediastinal adenopathy and multiple hypermetabolic pulmonary nodules, findings most indicative of stage IV primary bronchogenic carcinoma. 2. Complex cystic and solid ovarian masses bilaterally with mild associated hypermetabolism. Findings are worrisome for malignancy. 3. Hypermetabolic left parotid nodule. Malignancy cannot be excluded.  4. Moderate bilateral pleural effusions. 5. Bilateral renal stones. 6.  Aortic atherosclerosis (ICD10-170.0). Electronically Signed   By: Lorin Picket M.D.   On: 07/07/2018 12:06   Dg Chest Port 1 View  Result Date: 07/15/2018 CLINICAL DATA:  Chest tightness, shortness of Breath EXAM: PORTABLE CHEST 1 VIEW COMPARISON:  07/09/2018 FINDINGS: Large right mid lung mass with innumerable bilateral pulmonary nodules again noted, unchanged. Small right pleural effusion. Prior CABG. Mild cardiomegaly. No acute bony abnormality. IMPRESSION: Stable large right mid lung mass and innumerable pulmonary nodules. Small right effusion. Electronically Signed   By: Rolm Baptise M.D.   On: 07/15/2018 10:37   Dg Chest Port 1 View  Result Date: 07/09/2018 CLINICAL DATA:  Cough EXAM: PORTABLE CHEST 1 VIEW COMPARISON:  07/02/2018 FINDINGS: Large right central mass and numerous bilateral pulmonary metastases, stable since prior study. Prior CABG. Cardiomegaly. No visible significant effusions or acute bony  abnormality. IMPRESSION: No significant change in the right lung mass and numerous bilateral pulmonary metastases. Electronically Signed   By: Rolm Baptise M.D.   On: 07/09/2018 00:26   Dg Chest Port 1 View  Result Date: 06/17/2018 CLINICAL DATA:  Dyspnea today EXAM: PORTABLE CHEST 1 VIEW COMPARISON:  06/20/2010 FINDINGS: Multiple bilateral masslike opacities are seen scattered throughout the lungs consistent with metastasis. Heart size and mediastinal contours are stable with aortic atherosclerosis. Median sternotomy sutures are in place. Cardiac monitoring device projects over the left lateral hemithorax. No aggressive osseous lesions. Mild vascular congestion is noted. 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 Mild vascular congestion. Electronically Signed   By: Ashley Royalty M.D.   On: 06/17/2018 22:17   US Thoracentesis Asp Pleural Space W/img Guide  Result Date: 06/24/2018 INDICATION: Patient with CT findings highly suspicious of lung cancer, dyspnea, and right pleural effusion. Request is made for diagnostic and therapeutic right thoracentesis. EXAM: ULTRASOUND GUIDED DIAGNOSTIC AND THERAPEUTIC RIGHT THORACENTESIS MEDICATIONS: 10 mL 1% lidocaine COMPLICATIONS: None immediate. PROCEDURE: An ultrasound guided thoracentesis was thoroughly discussed with the patient and questions answered. The benefits, risks, alternatives and complications were also discussed. The patient understands and wishes to proceed with the procedure. Written consent was obtained. Ultrasound was performed to localize and mark an adequate pocket of fluid in the right chest. The area was then prepped and draped in the normal sterile fashion. 1% Lidocaine was used for local anesthesia. Under ultrasound guidance a 6 Fr Safe-T-Centesis catheter was introduced. Thoracentesis was performed. The catheter was removed and a dressing applied. FINDINGS: A total of approximately 650 mL of hazy yellow fluid was removed.  Samples were sent to the laboratory as requested by the clinical team. IMPRESSION: Successful ultrasound guided right thoracentesis yielding 650 mL of pleural fluid. Read by: Earley Abide, PA-C Electronically Signed   By: Aletta Edouard M.D.   On: 06/24/2018 15:22    Microbiology: No results found for this or any previous visit (from the past 240 hour(s)).   Labs: Basic Metabolic Panel: Recent Labs  Lab 07/09/18 1855 07/11/18 0505 07/13/18 0455 07/14/18 0511 07/15/18 0513  NA  --  143 144 143 142  K 5.5* 5.3* 5.1 5.0 4.9  CL  --  99 102 100 98  CO2  --  34* 36* 34* 35*  GLUCOSE  --  134* 73 102* 171*  BUN  --  36* 37* 40* 45*  CREATININE  --  0.90 0.87 0.90 0.91  CALCIUM  --  8.1* 8.1* 8.2* 8.5*   Liver Function Tests: Recent Labs  Lab 07/11/18 0505  AST 17  ALT 13  ALKPHOS 46  BILITOT 0.4  PROT 6.2*  ALBUMIN 2.9*   No results for input(s): LIPASE, AMYLASE in the last 168 hours. No results for input(s): AMMONIA in the last 168 hours. CBC: Recent Labs  Lab 07/15/18 0513  WBC 12.7*  NEUTROABS 11.5*  HGB 11.8*  HCT 38.2  MCV 95.3  PLT 243   Cardiac Enzymes: No results for input(s): CKTOTAL, CKMB, CKMBINDEX, TROPONINI in the last 168 hours. BNP: BNP (last 3 results) Recent Labs    06/17/18 2114 07/02/18 1143  BNP 61.2 114.2*    ProBNP (last 3 results) No results for input(s): PROBNP in the last 8760 hours.  CBG: Recent Labs  Lab 07/15/18 1210 07/15/18 1653 07/15/18 2148 07/16/18 0739 07/16/18 1233  GLUCAP 140* 123* 189* 114* 128*       Signed:  Florencia Reasons MD, PhD  [facility] 07/16/2018, 1:27 PM

## 2018-07-16 NOTE — Progress Notes (Signed)
PMT brief note   Patient is  resting in bed, her significant other [name] is at bedside.  Chart reviewed and also discussed with [facility] MD.  Appreciate [facility] support and consultation.  Patient has decided to go home with hospice support and arrangements are underway for home with hospice on discharge.  She is likely going to be d.c soon.    BP (!) 143/46   Pulse 63   Temp 97.6 F (36.4 C) (Oral)   Resp 18   Ht 4\' 10"  (1.473 m)   Wt 88.7 kg   SpO2 96%   BMI 40.87 kg/m  Labs and imaging noted Chart reviewed in detail.   15 minutes spent Loistine Chance MD [facility] health palliative medicine team [phone]

## 2018-07-16 NOTE — Progress Notes (Signed)
Spoke to [employer], Therapist, sports from [facility].  Patient to be discharged later today.  POA waiting on [facility] to deliver equipment.  Delivery will be made later today - time unknown at this time.  Transportation will be arranged when equipment is delivered.

## 2018-07-17 DIAGNOSIS — I4891 Unspecified atrial fibrillation: Secondary | ICD-10-CM | POA: Diagnosis not present

## 2018-07-17 DIAGNOSIS — C7931 Secondary malignant neoplasm of brain: Secondary | ICD-10-CM | POA: Diagnosis not present

## 2018-07-17 DIAGNOSIS — C3490 Malignant neoplasm of unspecified part of unspecified bronchus or lung: Secondary | ICD-10-CM | POA: Diagnosis not present

## 2018-07-17 DIAGNOSIS — C771 Secondary and unspecified malignant neoplasm of intrathoracic lymph nodes: Secondary | ICD-10-CM | POA: Diagnosis not present

## 2018-07-17 DIAGNOSIS — E1159 Type 2 diabetes mellitus with other circulatory complications: Secondary | ICD-10-CM | POA: Diagnosis not present

## 2018-07-17 DIAGNOSIS — E785 Hyperlipidemia, unspecified: Secondary | ICD-10-CM | POA: Diagnosis not present

## 2018-07-17 DIAGNOSIS — I25119 Atherosclerotic heart disease of native coronary artery with unspecified angina pectoris: Secondary | ICD-10-CM | POA: Diagnosis not present

## 2018-07-17 DIAGNOSIS — I10 Essential (primary) hypertension: Secondary | ICD-10-CM | POA: Diagnosis not present

## 2018-07-17 DIAGNOSIS — J449 Chronic obstructive pulmonary disease, unspecified: Secondary | ICD-10-CM | POA: Diagnosis not present

## 2018-07-17 DIAGNOSIS — R569 Unspecified convulsions: Secondary | ICD-10-CM | POA: Diagnosis not present

## 2018-07-19 ENCOUNTER — Ambulatory Visit: Payer: Medicare Other

## 2018-07-19 DIAGNOSIS — J449 Chronic obstructive pulmonary disease, unspecified: Secondary | ICD-10-CM | POA: Diagnosis not present

## 2018-07-19 DIAGNOSIS — C3490 Malignant neoplasm of unspecified part of unspecified bronchus or lung: Secondary | ICD-10-CM | POA: Diagnosis not present

## 2018-07-19 DIAGNOSIS — R569 Unspecified convulsions: Secondary | ICD-10-CM | POA: Diagnosis not present

## 2018-07-19 DIAGNOSIS — C771 Secondary and unspecified malignant neoplasm of intrathoracic lymph nodes: Secondary | ICD-10-CM | POA: Diagnosis not present

## 2018-07-19 DIAGNOSIS — C7931 Secondary malignant neoplasm of brain: Secondary | ICD-10-CM | POA: Diagnosis not present

## 2018-07-19 DIAGNOSIS — I25119 Atherosclerotic heart disease of native coronary artery with unspecified angina pectoris: Secondary | ICD-10-CM | POA: Diagnosis not present

## 2018-07-20 ENCOUNTER — Telehealth: Payer: Self-pay | Admitting: *Deleted

## 2018-07-20 ENCOUNTER — Telehealth: Payer: Self-pay | Admitting: Family Medicine

## 2018-07-20 ENCOUNTER — Ambulatory Visit: Payer: Medicare Other

## 2018-07-20 NOTE — Telephone Encounter (Signed)
Requesting duo nebs be called in to [facility] [address] per hospice. When they discharged pt home from hospital they did not call in any.

## 2018-07-20 NOTE — Telephone Encounter (Signed)
OK to send in to pharm?

## 2018-07-20 NOTE — Telephone Encounter (Signed)
ok 

## 2018-07-20 NOTE — Telephone Encounter (Signed)
Oncology Nurse Navigator Documentation  Oncology Nurse Navigator Flowsheets 07/20/2018  Navigator Location [facility]-  Navigator Encounter Type Telephone/per Dr. Julien Nordmann, he wanted me to call and follow up in Ms. [name] and her wishes for systemic treatment.  I spoke with her partner, [name] and he updated me that she is on Hospice and does not want to get treatment.  I listened as he explained.  I will update Dr. Julien Nordmann.   Telephone Outgoing Call  Treatment Phase Treatment  Barriers/Navigation Needs Coordination of Care  Interventions Coordination of Care  Coordination of Care Other  Acuity Level 2  Time Spent with Patient 30

## 2018-07-20 NOTE — Telephone Encounter (Signed)
Pt was inp at [facility] she is being discharged to hospice care and they are wanting to know if pickard will be the attending of record.

## 2018-07-21 ENCOUNTER — Ambulatory Visit: Payer: Medicare Other | Admitting: Gastroenterology

## 2018-07-21 ENCOUNTER — Ambulatory Visit: Payer: Medicare Other

## 2018-07-21 DIAGNOSIS — J449 Chronic obstructive pulmonary disease, unspecified: Secondary | ICD-10-CM | POA: Diagnosis not present

## 2018-07-21 DIAGNOSIS — R569 Unspecified convulsions: Secondary | ICD-10-CM | POA: Diagnosis not present

## 2018-07-21 DIAGNOSIS — C7931 Secondary malignant neoplasm of brain: Secondary | ICD-10-CM | POA: Diagnosis not present

## 2018-07-21 DIAGNOSIS — C3490 Malignant neoplasm of unspecified part of unspecified bronchus or lung: Secondary | ICD-10-CM | POA: Diagnosis not present

## 2018-07-21 DIAGNOSIS — I25119 Atherosclerotic heart disease of native coronary artery with unspecified angina pectoris: Secondary | ICD-10-CM | POA: Diagnosis not present

## 2018-07-21 DIAGNOSIS — C771 Secondary and unspecified malignant neoplasm of intrathoracic lymph nodes: Secondary | ICD-10-CM | POA: Diagnosis not present

## 2018-07-21 MED ORDER — IPRATROPIUM-ALBUTEROL 0.5-2.5 (3) MG/3ML IN SOLN: 3.0000 mL | Freq: Four times a day (QID) | RESPIRATORY_TRACT | 3 refills | Status: AC | PRN

## 2018-07-21 NOTE — Telephone Encounter (Signed)
Hospice aware Dr. Dennard Schaumann will be attending.

## 2018-07-21 NOTE — Telephone Encounter (Signed)
Medication called/sent to requested pharmacy  

## 2018-07-22 ENCOUNTER — Ambulatory Visit: Payer: Medicare Other

## 2018-07-23 ENCOUNTER — Ambulatory Visit: Payer: Medicare Other

## 2018-07-24 DIAGNOSIS — R569 Unspecified convulsions: Secondary | ICD-10-CM | POA: Diagnosis not present

## 2018-07-24 DIAGNOSIS — J449 Chronic obstructive pulmonary disease, unspecified: Secondary | ICD-10-CM | POA: Diagnosis not present

## 2018-07-24 DIAGNOSIS — I25119 Atherosclerotic heart disease of native coronary artery with unspecified angina pectoris: Secondary | ICD-10-CM | POA: Diagnosis not present

## 2018-07-24 DIAGNOSIS — C3490 Malignant neoplasm of unspecified part of unspecified bronchus or lung: Secondary | ICD-10-CM | POA: Diagnosis not present

## 2018-07-24 DIAGNOSIS — C7931 Secondary malignant neoplasm of brain: Secondary | ICD-10-CM | POA: Diagnosis not present

## 2018-07-24 DIAGNOSIS — C771 Secondary and unspecified malignant neoplasm of intrathoracic lymph nodes: Secondary | ICD-10-CM | POA: Diagnosis not present

## 2018-07-25 DIAGNOSIS — C771 Secondary and unspecified malignant neoplasm of intrathoracic lymph nodes: Secondary | ICD-10-CM | POA: Diagnosis not present

## 2018-07-25 DIAGNOSIS — J449 Chronic obstructive pulmonary disease, unspecified: Secondary | ICD-10-CM | POA: Diagnosis not present

## 2018-07-25 DIAGNOSIS — C3490 Malignant neoplasm of unspecified part of unspecified bronchus or lung: Secondary | ICD-10-CM | POA: Diagnosis not present

## 2018-07-25 DIAGNOSIS — C7931 Secondary malignant neoplasm of brain: Secondary | ICD-10-CM | POA: Diagnosis not present

## 2018-07-25 DIAGNOSIS — R569 Unspecified convulsions: Secondary | ICD-10-CM | POA: Diagnosis not present

## 2018-07-25 DIAGNOSIS — I25119 Atherosclerotic heart disease of native coronary artery with unspecified angina pectoris: Secondary | ICD-10-CM | POA: Diagnosis not present

## 2018-07-26 ENCOUNTER — Ambulatory Visit: Payer: Medicare Other

## 2018-07-26 DIAGNOSIS — C771 Secondary and unspecified malignant neoplasm of intrathoracic lymph nodes: Secondary | ICD-10-CM | POA: Diagnosis not present

## 2018-07-26 DIAGNOSIS — C7931 Secondary malignant neoplasm of brain: Secondary | ICD-10-CM | POA: Diagnosis not present

## 2018-07-26 DIAGNOSIS — C3490 Malignant neoplasm of unspecified part of unspecified bronchus or lung: Secondary | ICD-10-CM | POA: Diagnosis not present

## 2018-07-26 DIAGNOSIS — J449 Chronic obstructive pulmonary disease, unspecified: Secondary | ICD-10-CM | POA: Diagnosis not present

## 2018-07-26 DIAGNOSIS — I25119 Atherosclerotic heart disease of native coronary artery with unspecified angina pectoris: Secondary | ICD-10-CM | POA: Diagnosis not present

## 2018-07-26 DIAGNOSIS — R569 Unspecified convulsions: Secondary | ICD-10-CM | POA: Diagnosis not present

## 2018-07-27 ENCOUNTER — Ambulatory Visit: Payer: Medicare Other

## 2018-07-27 DIAGNOSIS — C771 Secondary and unspecified malignant neoplasm of intrathoracic lymph nodes: Secondary | ICD-10-CM | POA: Diagnosis not present

## 2018-07-27 DIAGNOSIS — R569 Unspecified convulsions: Secondary | ICD-10-CM | POA: Diagnosis not present

## 2018-07-27 DIAGNOSIS — J449 Chronic obstructive pulmonary disease, unspecified: Secondary | ICD-10-CM | POA: Diagnosis not present

## 2018-07-27 DIAGNOSIS — I25119 Atherosclerotic heart disease of native coronary artery with unspecified angina pectoris: Secondary | ICD-10-CM | POA: Diagnosis not present

## 2018-07-27 DIAGNOSIS — C3490 Malignant neoplasm of unspecified part of unspecified bronchus or lung: Secondary | ICD-10-CM | POA: Diagnosis not present

## 2018-07-27 DIAGNOSIS — C7931 Secondary malignant neoplasm of brain: Secondary | ICD-10-CM | POA: Diagnosis not present

## 2018-07-28 ENCOUNTER — Ambulatory Visit: Payer: Medicare Other

## 2018-07-29 ENCOUNTER — Ambulatory Visit: Payer: Medicare Other

## 2018-07-29 ENCOUNTER — Telehealth: Payer: Self-pay | Admitting: *Deleted

## 2018-07-29 NOTE — Telephone Encounter (Signed)
Received call from [facility] with [facility].   Reports that patient passed on 08-16-2018 at home. Inquired as to if MD will be signing death certificate.   Advised to have form dropped off at office.

## 2018-07-30 ENCOUNTER — Ambulatory Visit: Payer: Medicare Other

## 2018-08-02 ENCOUNTER — Encounter: Payer: Self-pay | Admitting: Radiation Oncology

## 2018-08-02 ENCOUNTER — Ambulatory Visit: Payer: Medicare Other

## 2018-08-02 NOTE — Progress Notes (Signed)
  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 Patient received 12 Gy in 4 fractions, the patient wished to discontinue her treatment and to be discharged from the hospital with hospice  Beams/energy:   1. Isodose, 6X, lateral fields encompassing the whole brain  Narrative: The patient tolerated radiation treatment relatively well. She remained inpatient for the duration of her treatment. Given the patient's extensive lung involvement respiratory issues were a major issue.  Plan: hospice, prn followup in radiation oncology.  -----------------------------------  Blair Promise, PhD, MD  This document serves as a record of services personally performed by Gery Pray, MD. It was created on his behalf by [facility], a trained medical scribe. The creation of this record is based on the scribe's personal observations and the provider's statements to them. This document has been checked and approved by the attending provider.

## 2018-08-04 DEATH — deceased

## 2018-08-26 ENCOUNTER — Other Ambulatory Visit: Payer: Self-pay | Admitting: Cardiovascular Disease

## 2020-02-19 IMAGING — DX DG CHEST 1V
1 series · 1 of 1 positions shown · non-contrast
Comparison: Chest x-ray dated June 17, 2018.

CLINICAL DATA: Status post right thoracentesis.

EXAM:
CHEST  1 VIEW

[chest pa]
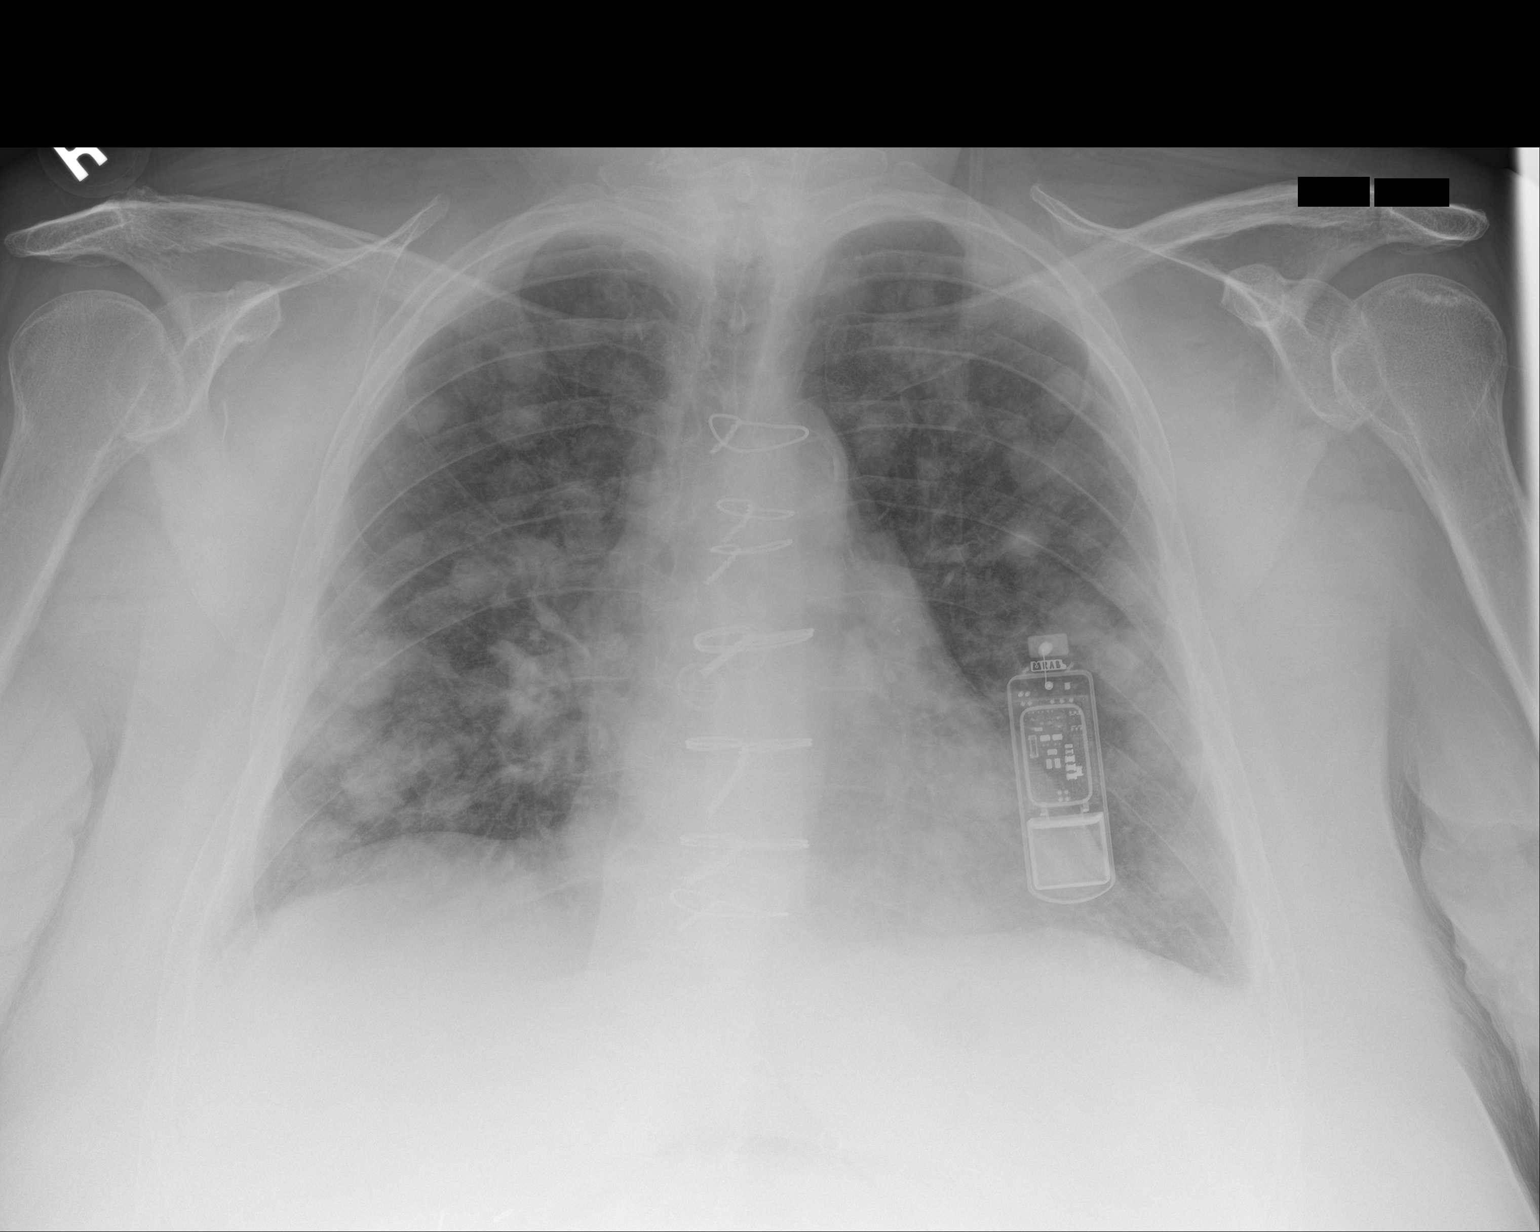

[1 of 1 positions shown; findings below may reference images not displayed]

FINDINGS: Stable cardiomediastinal silhouette status post CABG. Innumerable
bilateral pulmonary nodules are unchanged. No significant residual
right pleural effusion. Unchanged trace left pleural effusion. No
pneumothorax. No acute osseous abnormality.
IMPRESSION: 1. No residual right pleural effusion status post thoracentesis. No
pneumothorax.
2. Unchanged diffuse pulmonary metastatic disease.

## 2020-02-23 IMAGING — DX DG CHEST 2V
2 series · 2 of 2 positions shown · non-contrast
Comparison: Portable chest x-ray June 24, 2018

CLINICAL DATA: Status post right-sided thoracentesis on June 24, 2018.

EXAM:
CHEST - 2 VIEW

[chest pa]
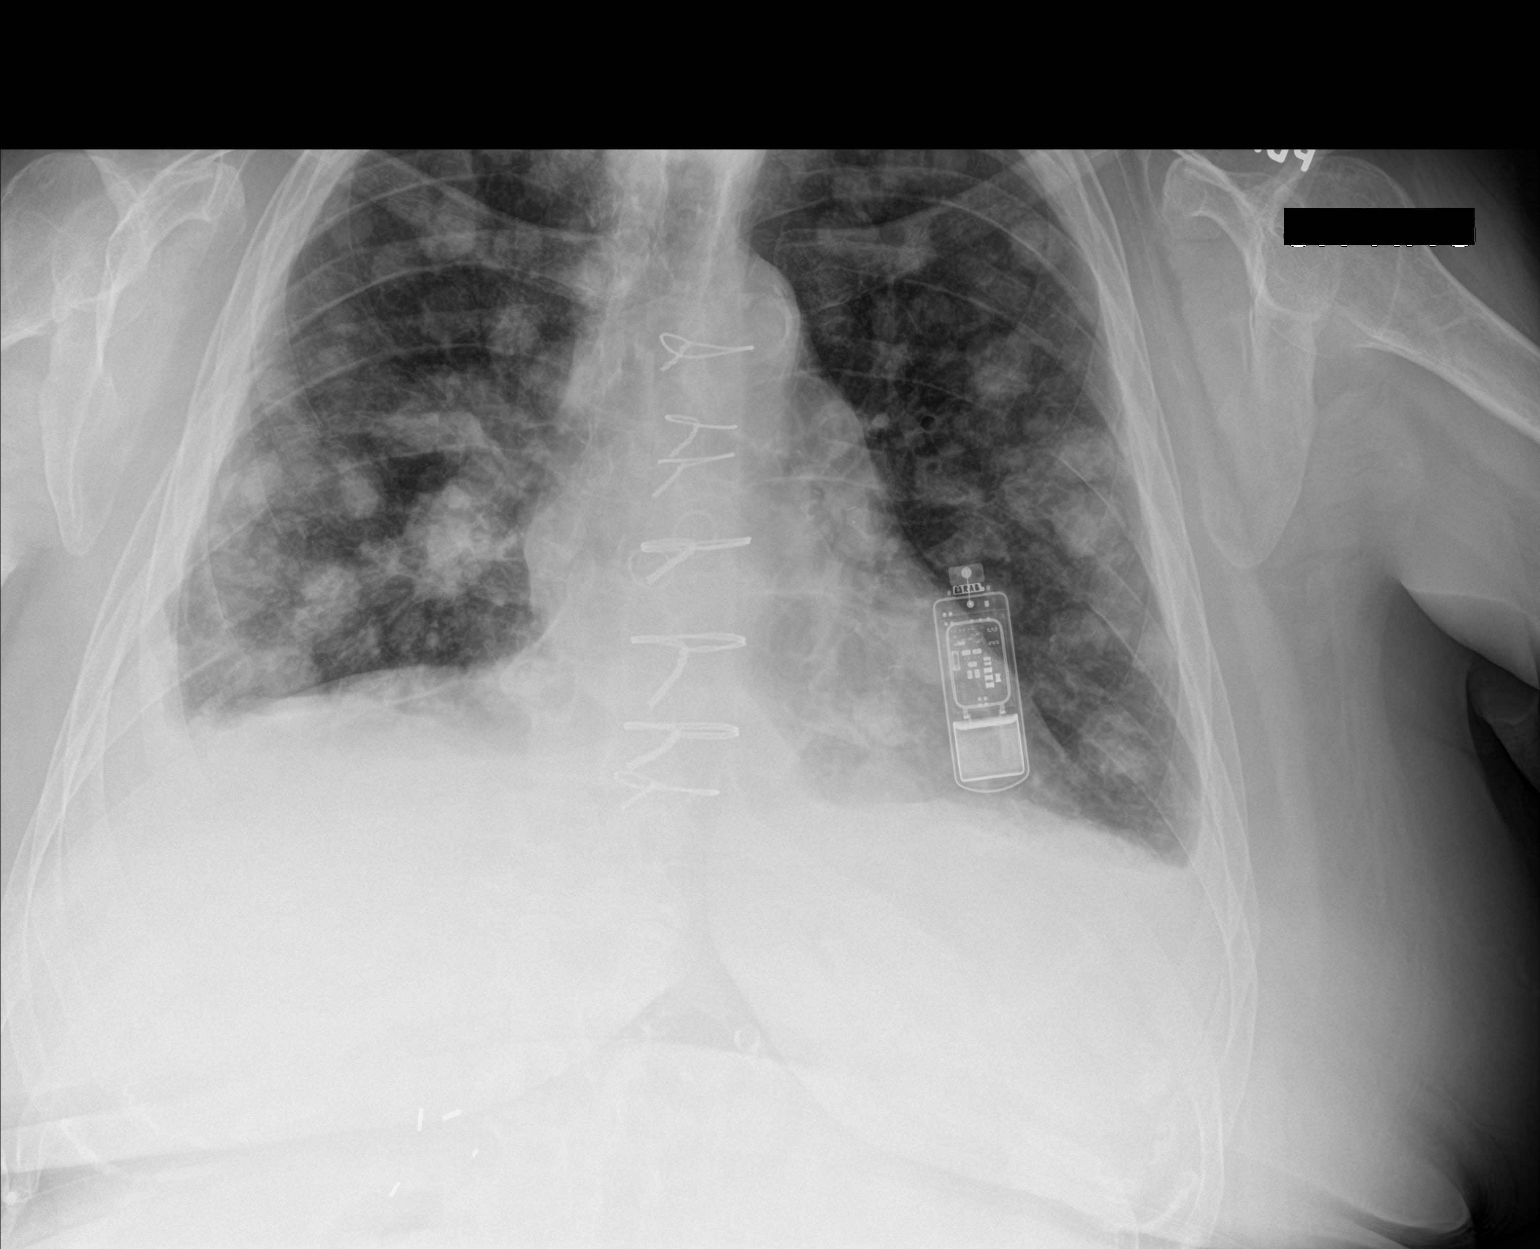

[chest lat]
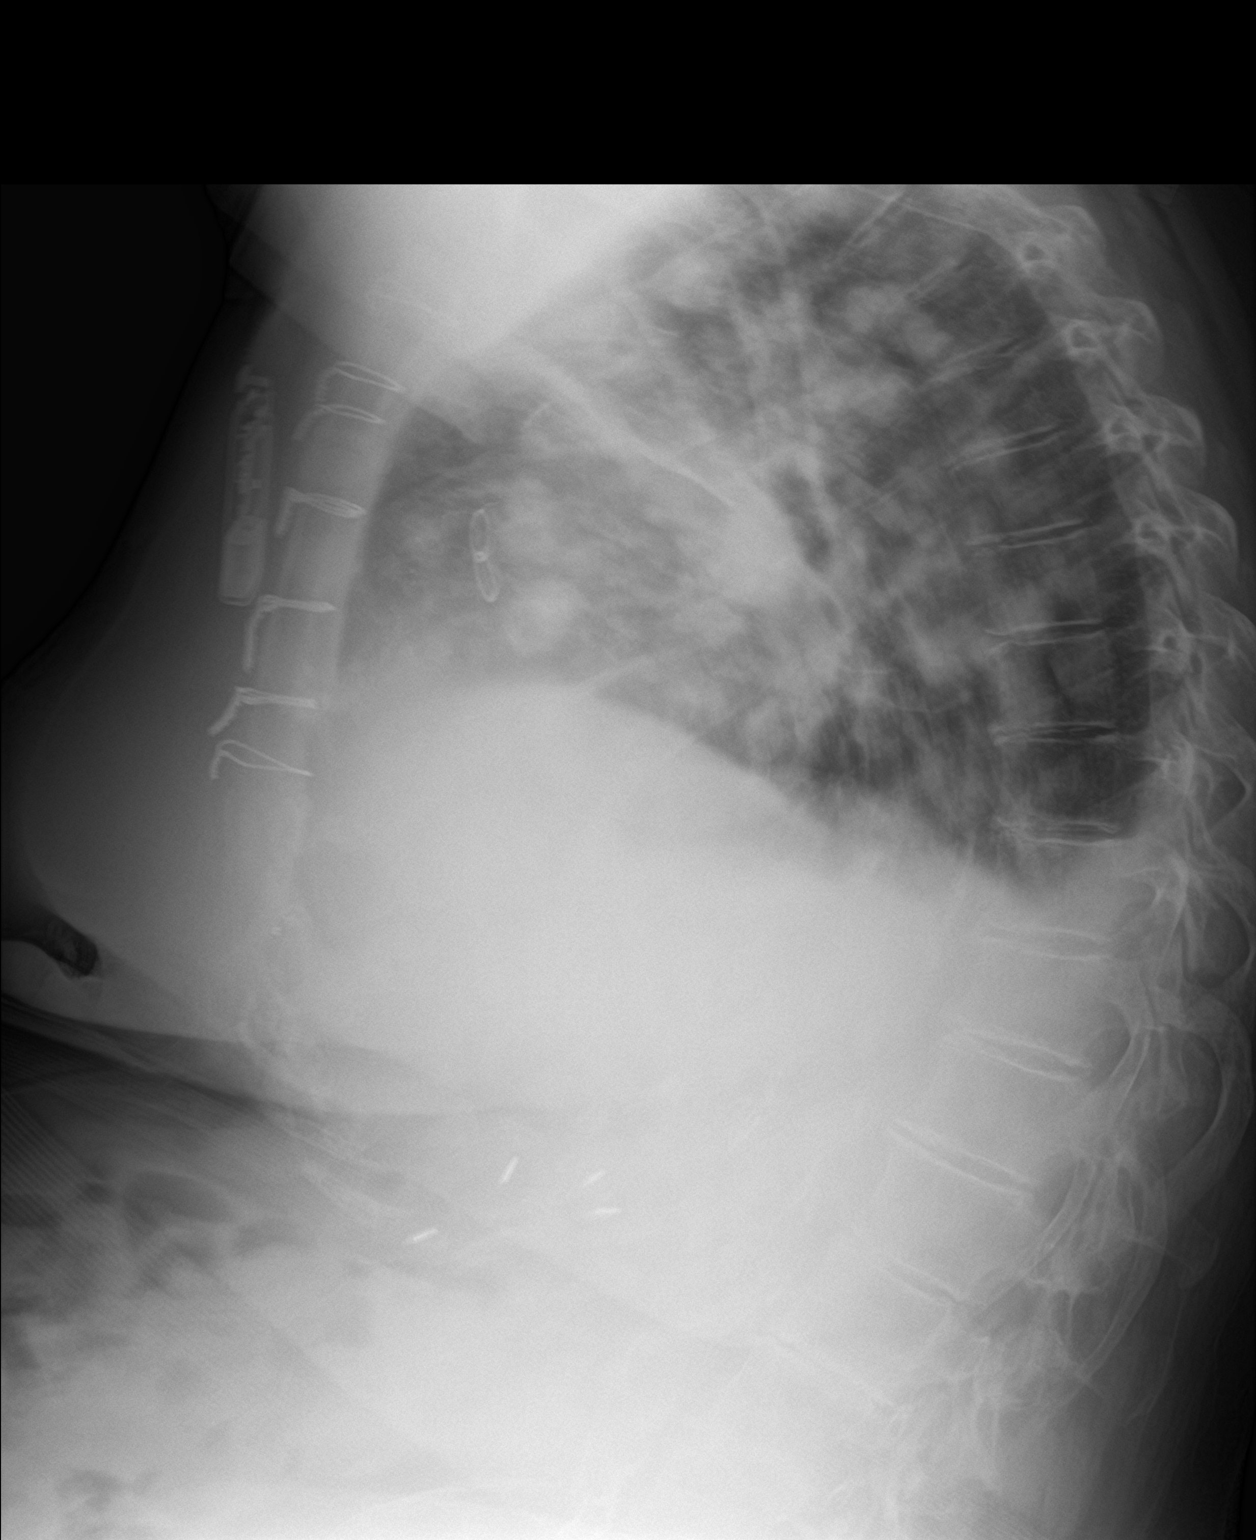

[2 of 2 positions shown; findings below may reference images not displayed]

FINDINGS: A small amount of pleural fluid has reaccumulated on the right.
There is a trace of pleural fluid on the left as well. Multiple
widespread pulmonary parenchymal masses of varying sizes are
present. The heart and pulmonary vascularity are normal. The patient
has undergone previous CABG. There is calcification in the wall of
the aortic arch. The sternal wires are intact. A presumed cardiac
loop recorder is located over the left cardiac apex.
IMPRESSION: Interval reaccumulation of a small amount of pleural fluid on the
right. A smaller left pleural effusion is present but stable.
Multiple widespread pulmonary metastatic nodules.

## 2020-02-27 IMAGING — CR DG CHEST 2V
2 series · 2 of 2 positions shown · non-contrast
Comparison: 06/28/2018.

CLINICAL DATA: Shortness of breath.

EXAM:
CHEST - 2 VIEW

[chest lat]
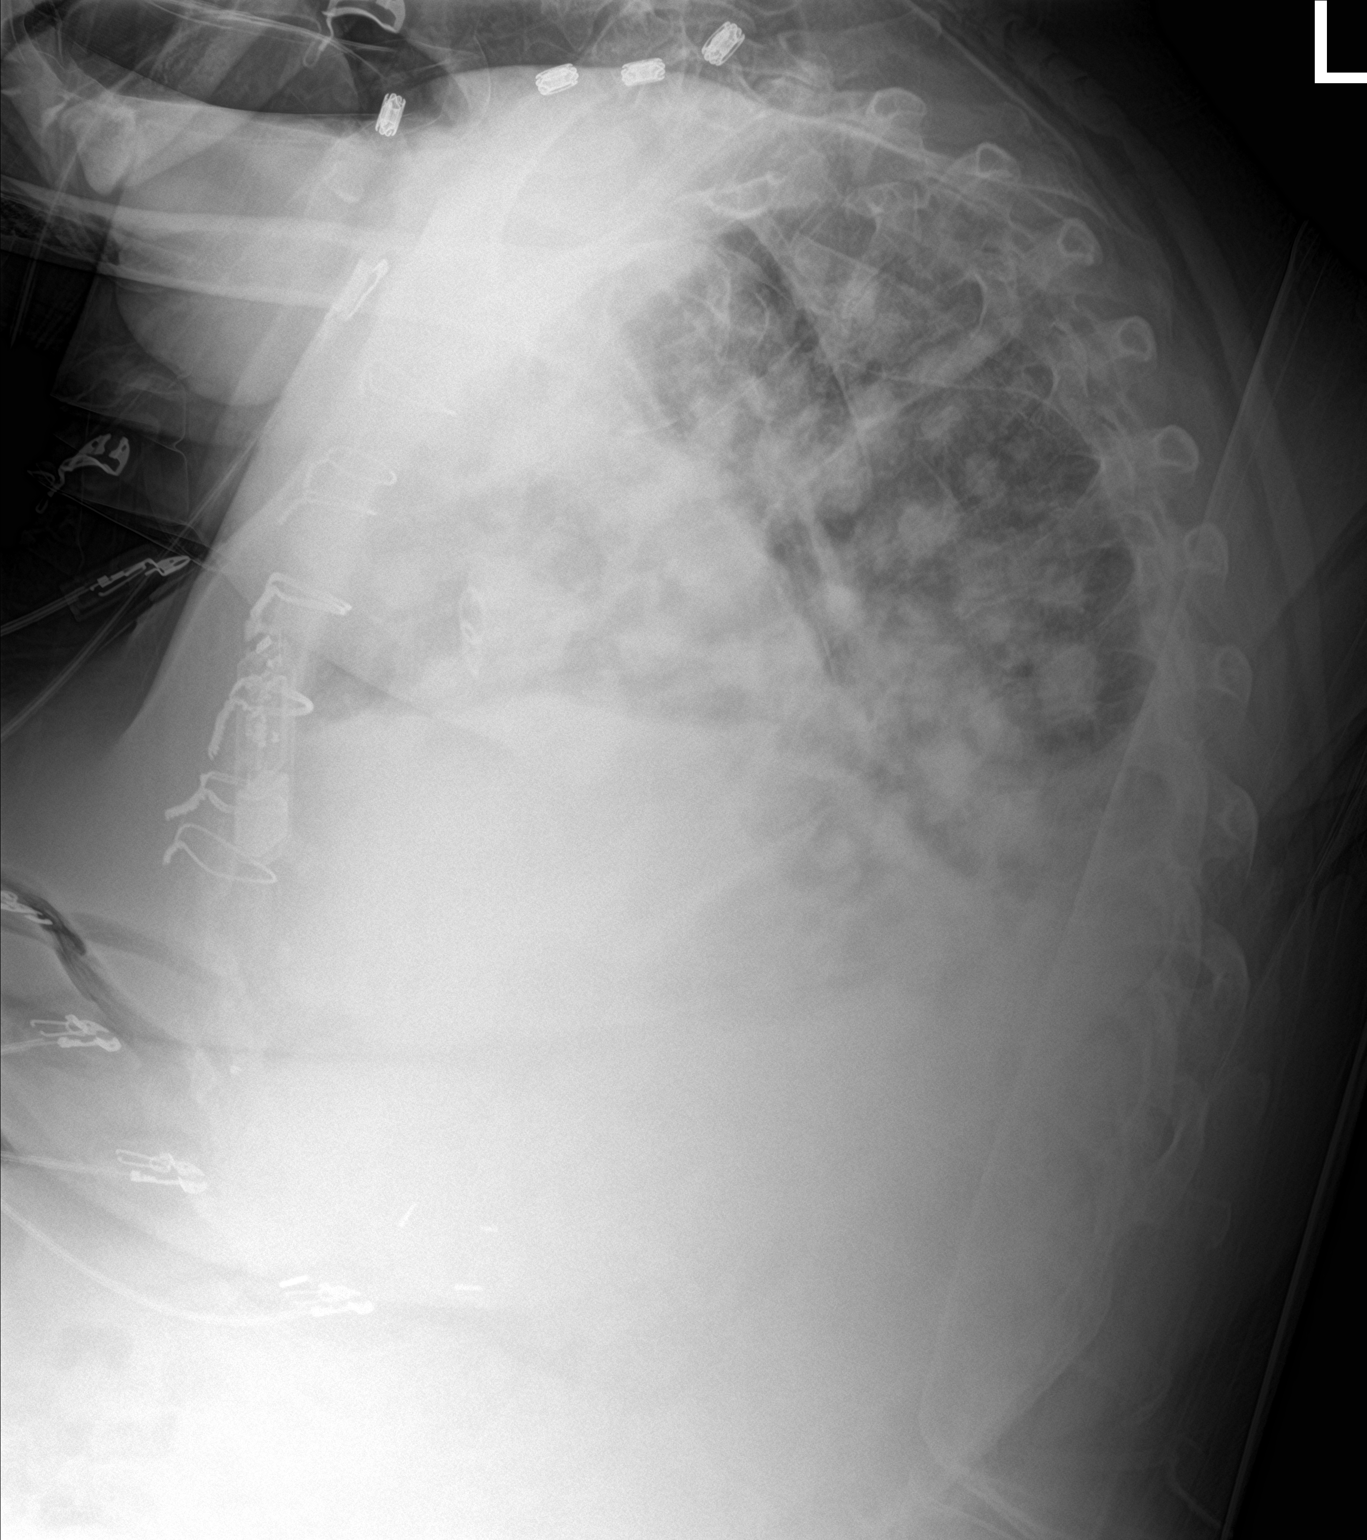

[chest ap]
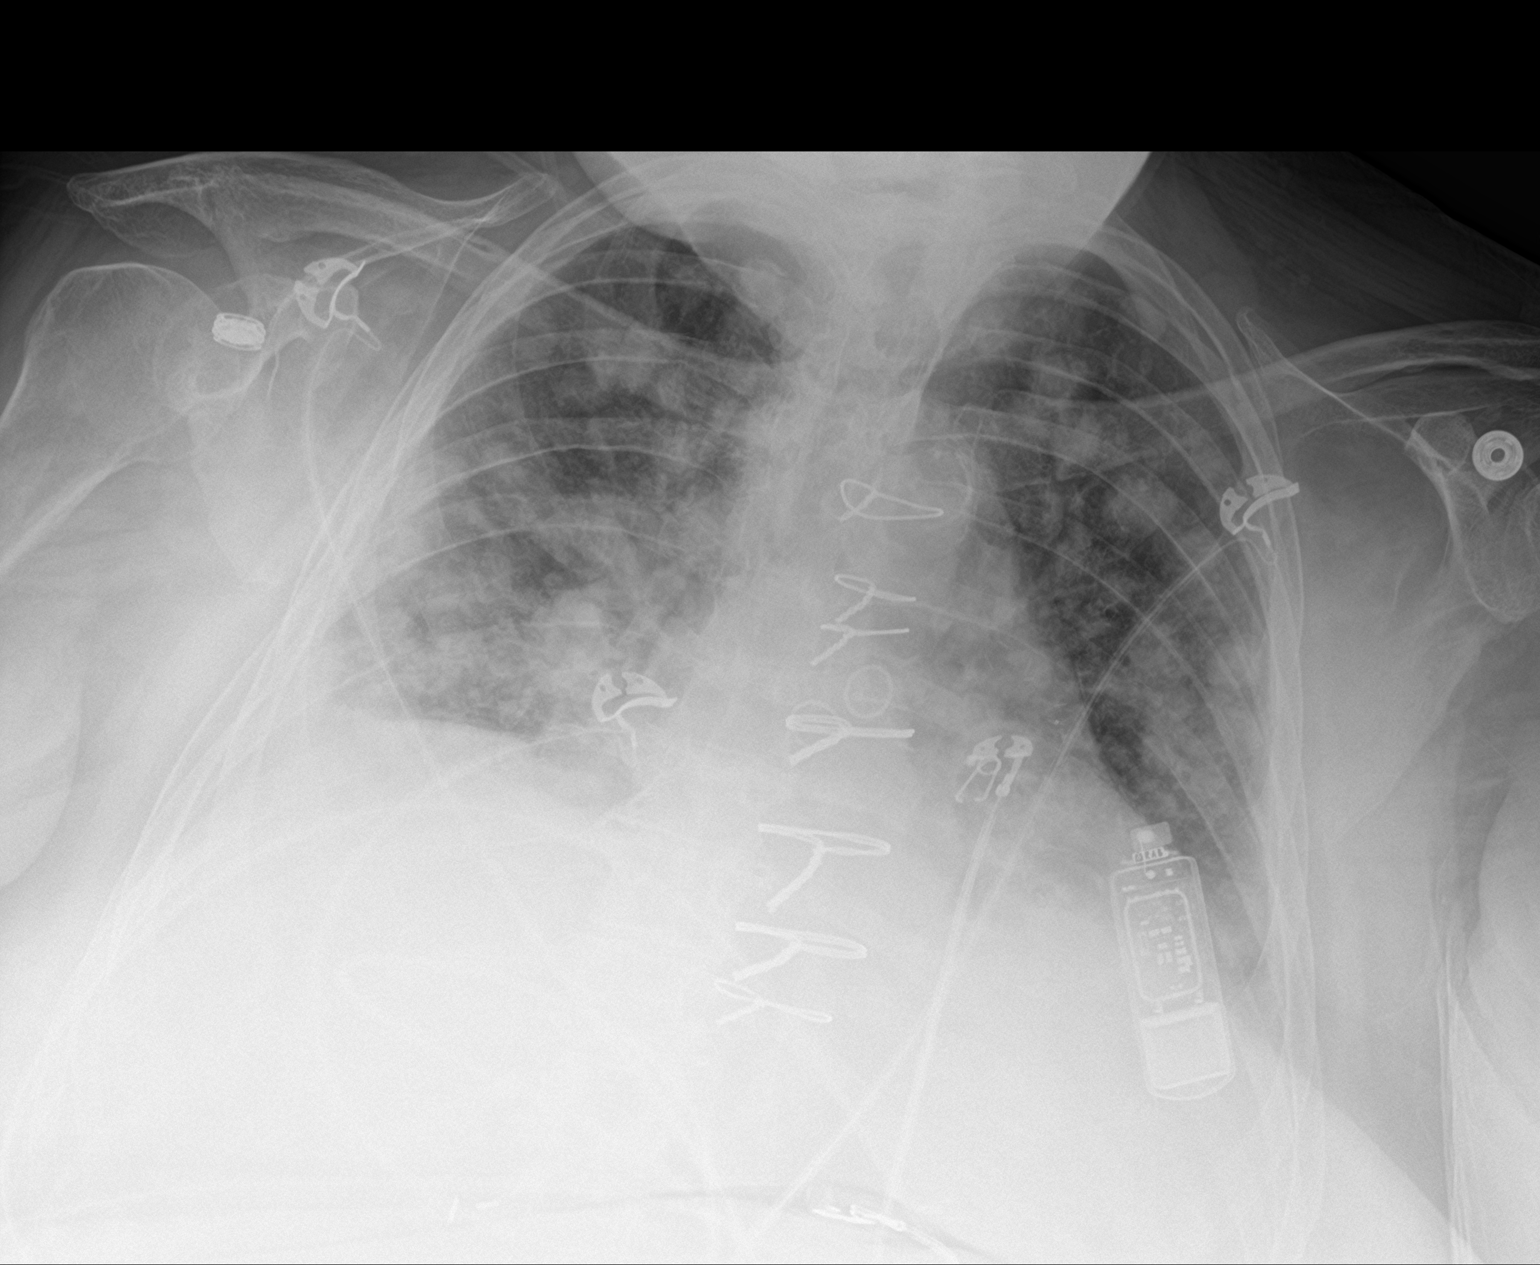

[2 of 2 positions shown; findings below may reference images not displayed]

FINDINGS: Monitoring device noted over the chest. Prior CABG. Heart size
stable. Multiple bilateral pulmonary mass lesions consistent
metastatic disease again noted. Stable small bilateral pleural
effusions. No pneumothorax.
IMPRESSION: Multiple bilateral pulmonary mass lesions consistent metastatic
disease again noted. Stable small bilateral pleural effusions.
